# Patient Record
Sex: Female | Born: 1960 | Race: White | Hispanic: No | Marital: Single | Smoking: Former smoker
Health system: Southern US, Community
[De-identification: ages and names within clinical notes are randomized; demographics above are authoritative.]

## PROBLEM LIST (undated history)

## (undated) ENCOUNTER — Ambulatory Visit: Payer: BLUE CROSS/BLUE SHIELD

## (undated) ENCOUNTER — Ambulatory Visit

## (undated) ENCOUNTER — Encounter: Attending: Internal Medicine | Primary: Internal Medicine

## (undated) ENCOUNTER — Telehealth: Attending: Internal Medicine | Primary: Internal Medicine

## (undated) ENCOUNTER — Encounter

## (undated) ENCOUNTER — Telehealth

## (undated) ENCOUNTER — Ambulatory Visit: Payer: MEDICARE

## (undated) ENCOUNTER — Encounter
Payer: PRIVATE HEALTH INSURANCE | Attending: Rehabilitative and Restorative Service Providers" | Primary: Rehabilitative and Restorative Service Providers"

## (undated) ENCOUNTER — Encounter: Payer: PRIVATE HEALTH INSURANCE | Attending: Registered" | Primary: Registered"

## (undated) ENCOUNTER — Ambulatory Visit: Payer: PRIVATE HEALTH INSURANCE

## (undated) ENCOUNTER — Ambulatory Visit: Payer: PRIVATE HEALTH INSURANCE | Attending: Foot and Ankle Surgery | Primary: Foot and Ankle Surgery

## (undated) ENCOUNTER — Encounter
Attending: Pharmacist Clinician (PhC)/ Clinical Pharmacy Specialist | Primary: Pharmacist Clinician (PhC)/ Clinical Pharmacy Specialist

## (undated) ENCOUNTER — Telehealth: Attending: Ambulatory Care | Primary: Ambulatory Care

## (undated) ENCOUNTER — Encounter: Payer: PRIVATE HEALTH INSURANCE | Attending: Foot and Ankle Surgery | Primary: Foot and Ankle Surgery

## (undated) ENCOUNTER — Ambulatory Visit: Attending: Pharmacist | Primary: Pharmacist

## (undated) ENCOUNTER — Encounter
Attending: Student in an Organized Health Care Education/Training Program | Primary: Student in an Organized Health Care Education/Training Program

## (undated) ENCOUNTER — Encounter
Payer: PRIVATE HEALTH INSURANCE | Attending: Student in an Organized Health Care Education/Training Program | Primary: Student in an Organized Health Care Education/Training Program

## (undated) ENCOUNTER — Telehealth: Attending: Nurse Practitioner | Primary: Nurse Practitioner

## (undated) ENCOUNTER — Ambulatory Visit: Payer: Medicare (Managed Care) | Attending: Pharmacist | Primary: Pharmacist

## (undated) ENCOUNTER — Encounter: Payer: PRIVATE HEALTH INSURANCE | Attending: Internal Medicine | Primary: Internal Medicine

## (undated) ENCOUNTER — Ambulatory Visit: Attending: Addiction (Substance Use Disorder) | Primary: Addiction (Substance Use Disorder)

## (undated) ENCOUNTER — Ambulatory Visit
Payer: PRIVATE HEALTH INSURANCE | Attending: Student in an Organized Health Care Education/Training Program | Primary: Student in an Organized Health Care Education/Training Program

## (undated) ENCOUNTER — Telehealth: Attending: Obstetrics & Gynecology | Primary: Obstetrics & Gynecology

## (undated) ENCOUNTER — Encounter: Attending: Pharmacist | Primary: Pharmacist

## (undated) ENCOUNTER — Ambulatory Visit: Attending: Internal Medicine | Primary: Internal Medicine

## (undated) ENCOUNTER — Ambulatory Visit: Attending: Mental Health | Primary: Mental Health

## (undated) ENCOUNTER — Ambulatory Visit: Payer: PRIVATE HEALTH INSURANCE | Attending: Internal Medicine | Primary: Internal Medicine

## (undated) ENCOUNTER — Ambulatory Visit
Payer: PRIVATE HEALTH INSURANCE | Attending: Physical Medicine & Rehabilitation | Primary: Physical Medicine & Rehabilitation

## (undated) ENCOUNTER — Encounter: Attending: Foot and Ankle Surgery | Primary: Foot and Ankle Surgery

## (undated) DIAGNOSIS — J449 Chronic obstructive pulmonary disease, unspecified: Secondary | ICD-10-CM

## (undated) DIAGNOSIS — I10 Essential (primary) hypertension: Secondary | ICD-10-CM

## (undated) HISTORY — PX: ABDOMINAL HYSTERECTOMY: SHX81

## (undated) HISTORY — PX: TYMPANOPLASTY: SHX33

## (undated) HISTORY — PX: CYST EXCISION: SHX5701

---
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
## 2016-04-03 ENCOUNTER — Encounter: Payer: Self-pay | Admitting: Emergency Medicine

## 2016-04-03 ENCOUNTER — Ambulatory Visit
Admission: EM | Admit: 2016-04-03 | Discharge: 2016-04-03 | Disposition: A | Discharge status: Home / Self Care | Payer: Self-pay | Attending: Family Medicine | Admitting: Family Medicine

## 2016-04-03 DIAGNOSIS — M67321 Transient synovitis, right elbow: Secondary | ICD-10-CM

## 2016-04-03 DIAGNOSIS — M67361 Transient synovitis, right knee: Secondary | ICD-10-CM

## 2016-04-03 MED ORDER — PREDNISONE 10 MG (21) PO TBPK
10.0000 mg | ORAL_TABLET | Freq: Every day | ORAL | Status: DC
Start: 2016-04-03 — End: 2016-08-10

## 2016-04-03 NOTE — ED Provider Notes (Signed)
CSN: [id number]     Arrival date & time 04/03/16  1619 History   First MD Initiated Contact with Patient 04/03/16 1722     Chief Complaint  Patient presents with  . Foot Pain  . Foot Swelling   (Consider location/radiation/quality/duration/timing/severity/associated sxs/prior Treatment) HPI  This is a 55 year old female who presents with right foot pain and swelling and increased swelling of her right elbow. She states that she is downsizing from a 3000 ft. home to a thousand square foot apartment and has been moving furniture and packing and unpacking for weeks. She still has more to move. Over the weekend the symptoms became much worse. She knows that this is from all of the activities that she has been doing. She has noticed that if she gets up and moves around the pain  is lessened when she sits down she tends to come very stiff and was extremely painful to begin movement again. She has a previous history of fracture of the right elbow and never has regained full extension. She has a torn meniscus on the left knee and followed by her orthopedist. She denies any numbness or tingling.     History reviewed. No pertinent past medical history. Past Surgical History  Procedure Laterality Date  . Abdominal hysterectomy     History reviewed. No pertinent family history. Social History  Substance Use Topics  . Smoking status: Current Every Day Smoker -- 1.00 packs/day    Types: Cigarettes  . Smokeless tobacco: None  . Alcohol Use: Yes   OB History    No data available     Review of Systems  Constitutional: Positive for activity change. Negative for fever, chills and fatigue.  Musculoskeletal: Positive for myalgias, joint swelling and arthralgias.  All other systems reviewed and are negative.   Allergies  Sulfa antibiotics  Home Medications   Prior to Admission medications   Medication Sig Start Date End Date Taking? Authorizing Provider  ibuprofen (ADVIL,MOTRIN) 800 MG tablet  Take 800 mg by mouth every 8 (eight) hours as needed.   Yes Historical Provider, MD  predniSONE (STERAPRED UNI-PAK 21 TAB) 10 MG (21) TBPK tablet Take 1 tablet (10 mg total) by mouth daily. Take 6 tabs by mouth daily  for 2 days, then 5 tabs for 2 days, then 4 tabs for 2 days, then 3 tabs for 2 days, 2 tabs for 2 days, then 1 tab by mouth daily for 2 days 04/03/16   Lutricia Feil, PA-C   Meds Ordered and Administered this Visit  Medications - No data to display  BP 160/89 mmHg  Pulse 85  Temp(Src) 99.3 F (37.4 C) (Tympanic)  Resp 17  Ht 5\' 6"  (1.676 m)  Wt 229 lb (103.874 kg)  BMI 36.98 kg/m2  SpO2 96% No data found.   Physical Exam  Constitutional: She is oriented to person, place, and time. She appears well-developed and well-nourished. No distress.  HENT:  Head: Normocephalic and atraumatic.  Eyes: Conjunctivae are normal. Pupils are equal, round, and reactive to light.  Neck: Normal range of motion. Neck supple.  Musculoskeletal: She exhibits edema and tenderness.  Examination of the right elbow shows full flexion but lacks 10 of full extension. This is from a previous fracture and is a residual of that injury. There is tenderness about the elbow joint which is diffuse. There was no additional injury to this joint. Wrist range of motion finger range of motion are normal. Pronation and supination are full. Shoulder range  of motion is normal. Examination of the right ankle shows a bogginess and tenderness about the tear lateral posterior and medial ankle. There is a palpable swelling. Range of motion is slightly decreased in comparison to the left. Neurovascular is intact distally. She does have some pitting edema of both lower extremities graded 1+. This is probably from now for prolonged periods and is dependent edema.  Neurological: She is alert and oriented to person, place, and time. She exhibits normal muscle tone. Coordination normal.  Skin: Skin is warm and dry. No rash  noted. She is not diaphoretic. No erythema. No pallor.  Psychiatric: She has a normal mood and affect. Her behavior is normal. Judgment and thought content normal.  Nursing note and vitals reviewed.   ED Course  Procedures (including critical care time)  Labs Review Labs Reviewed - No data to display  Imaging Review No results found.   Visual Acuity Review  Right Eye Distance:   Left Eye Distance:   Bilateral Distance:    Right Eye Near:   Left Eye Near:    Bilateral Near:         MDM   1. Transient synovitis of right elbow   2. Transient synovitis of right knee    Discharge Medication List as of 04/03/2016  6:03 PM    START taking these medications   Details  predniSONE (STERAPRED UNI-PAK 21 TAB) 10 MG (21) TBPK tablet Take 1 tablet (10 mg total) by mouth daily. Take 6 tabs by mouth daily  for 2 days, then 5 tabs for 2 days, then 4 tabs for 2 days, then 3 tabs for 2 days, 2 tabs for 2 days, then 1 tab by mouth daily for 2 days, Starting 04/03/2016, Until Discontinued, P rint      Plan: 1. Test/x-ray results and diagnosis reviewed with patient 2. rx as per orders; risks, benefits, potential side effects reviewed with patient 3. Recommend supportive treatment with Rest and symptom avoidance. Or she does not slow down this will only worsen. She has tried nostril anti-inflammatory medications which have not been beneficial. In the past she has used steroids and is requesting those. Her preferred to place her on a Medrol Dosepak of 6 day but that she is insisting on 12 days because of the amount of time she has before her house" warned her of the side effects of prednisone and the limbs associated with long-term use. Despite this she wishes to mask her symptoms so that she can complete the move before her closing date. She'll follow-up with her primary care if she is not improving. 4. F/u prn if symptoms worsen or don't improve      Lutricia Feil, PA-C 04/03/16 1822

## 2016-04-03 NOTE — ED Notes (Signed)
Patient c/o right foot pain and swelling that started over the weekend.

## 2016-08-10 ENCOUNTER — Encounter
Admission: RE | Admit: 2016-08-10 | Discharge: 2016-08-10 | Disposition: A | Discharge status: Home / Self Care | Payer: Self-pay | Source: Ambulatory Visit | Attending: Orthopedic Surgery | Admitting: Orthopedic Surgery

## 2016-08-10 NOTE — Patient Instructions (Addendum)
  Your procedure is scheduled [id number] 31, 2017. Report to Same Day Surgery. To find out your arrival time please call [phone] between 1PM - 3PM on Wednesday August 15, 2016.  Remember: Instructions that are not followed completely may result in serious medical risk, up to and including death, or upon the discretion of your surgeon and anesthesiologist your surgery may need to be rescheduled.    _x___ 1. Do not eat food or drink liquids after midnight. No gum chewing or hard candies.     _x___ 2. No Alcohol for 24 hours before or after surgery.   ____ 3. Bring all medications with you on the day of surgery if instructed.    __x__ 4. Notify your doctor if there is any change in your medical condition     (cold, fever, infections)  __x__ 5. No smoking for 24 hours prior to surgery.     Do not wear jewelry, make-up, hairpins, clips or nail polish.  Do not wear lotions, powders, or perfumes. You may wear deodorant.  Do not shave 48 hours prior to surgery. Men may shave face and neck.  Do not bring valuables to the hospital.    [facility] is not responsible for any belongings or valuables.               Contacts, dentures or bridgework may not be worn into surgery.  Leave your suitcase in the car. After surgery it may be brought to your room.  For patients admitted to the hospital, discharge time is determined by your treatment team.   Patients discharged the day of surgery will not be allowed to drive home.    Please read over the following fact sheets that you were given:   [facility] Preparing for Surgery  ____ Take these medicines the morning of surgery with A SIP OF WATER: none    ____ Fleet Enema (as directed)   ____ Use CHG Soap as directed on instruction sheet  ____ Use inhalers on the day of surgery and bring to hospital day of surgery  ____ Stop metformin 2 days prior to surgery    ____ Take 1/2 of usual insulin dose the night before surgery and none on the  morning of  surgery.   ____ Stop Coumadin/Plavix/aspirin on does not apply.  _x___ Stop Anti-inflammatories such as Diclofenac, Advil, Aleve, Ibuprofen, Motrin, Naproxen, Naprosyn, Goodies powders or aspirin products. OK to take Tylenol and  or Hydrocodone-acetaminophen.   _x___ Stop supplements: all vitamins until after surgery.    ____ Bring C-Pap to the hospital.

## 2016-08-16 ENCOUNTER — Ambulatory Visit: Payer: BLUE CROSS/BLUE SHIELD | Admitting: Anesthesiology

## 2016-08-16 ENCOUNTER — Encounter
Admission: RE | Disposition: A | Discharge status: Home / Self Care | Payer: Self-pay | Source: Ambulatory Visit | Attending: Orthopedic Surgery

## 2016-08-16 ENCOUNTER — Ambulatory Visit
Admission: RE | Admit: 2016-08-16 | Discharge: 2016-08-16 | Disposition: A | Discharge status: Home / Self Care | Payer: BLUE CROSS/BLUE SHIELD | Source: Ambulatory Visit | Attending: Orthopedic Surgery | Admitting: Orthopedic Surgery

## 2016-08-16 ENCOUNTER — Encounter: Payer: Self-pay | Admitting: *Deleted

## 2016-08-16 DIAGNOSIS — M23312 Other meniscus derangements, anterior horn of medial meniscus, left knee: Secondary | ICD-10-CM | POA: Diagnosis not present

## 2016-08-16 DIAGNOSIS — M2342 Loose body in knee, left knee: Secondary | ICD-10-CM | POA: Diagnosis not present

## 2016-08-16 DIAGNOSIS — S83282A Other tear of lateral meniscus, current injury, left knee, initial encounter: Secondary | ICD-10-CM | POA: Diagnosis present

## 2016-08-16 DIAGNOSIS — F17200 Nicotine dependence, unspecified, uncomplicated: Secondary | ICD-10-CM | POA: Diagnosis not present

## 2016-08-16 DIAGNOSIS — M65862 Other synovitis and tenosynovitis, left lower leg: Secondary | ICD-10-CM | POA: Insufficient documentation

## 2016-08-16 DIAGNOSIS — M23342 Other meniscus derangements, anterior horn of lateral meniscus, left knee: Secondary | ICD-10-CM | POA: Diagnosis not present

## 2016-08-16 HISTORY — PX: KNEE ARTHROSCOPY WITH MEDIAL MENISECTOMY: SHX5651

## 2016-08-16 SURGERY — ARTHROSCOPY, KNEE, WITH MEDIAL MENISCECTOMY
Anesthesia: General | Site: Knee | Laterality: Left | Wound class: Clean

## 2016-08-16 MED ORDER — PHENYLEPHRINE HCL 10 MG/ML IJ SOLN
INTRAMUSCULAR | Status: DC | PRN
  Administered 2016-08-16 (×2): 100 ug via INTRAVENOUS

## 2016-08-16 MED ORDER — HYDROCODONE-ACETAMINOPHEN 5-325 MG PO TABS
ORAL_TABLET | ORAL | Status: AC
  Filled 2016-08-16: qty 1

## 2016-08-16 MED ORDER — FAMOTIDINE 20 MG PO TABS
20.0000 mg | ORAL_TABLET | Freq: Once | ORAL | Status: AC
  Administered 2016-08-16: 20 mg via ORAL

## 2016-08-16 MED ORDER — DEXMEDETOMIDINE HCL 200 MCG/2ML IV SOLN
INTRAVENOUS | Status: DC | PRN
  Administered 2016-08-16 (×2): 8 ug via INTRAVENOUS

## 2016-08-16 MED ORDER — PROPOFOL 10 MG/ML IV BOLUS
INTRAVENOUS | Status: DC | PRN
  Administered 2016-08-16: 200 mg via INTRAVENOUS

## 2016-08-16 MED ORDER — ONDANSETRON HCL 4 MG/2ML IJ SOLN
INTRAMUSCULAR | Status: DC | PRN
  Administered 2016-08-16: 4 mg via INTRAVENOUS

## 2016-08-16 MED ORDER — BUPIVACAINE-EPINEPHRINE (PF) 0.5% -1:200000 IJ SOLN
INTRAMUSCULAR | Status: AC
  Filled 2016-08-16: qty 30

## 2016-08-16 MED ORDER — MIDAZOLAM HCL 5 MG/5ML IJ SOLN
INTRAMUSCULAR | Status: DC | PRN
  Administered 2016-08-16: 2 mg via INTRAVENOUS

## 2016-08-16 MED ORDER — ONDANSETRON HCL 4 MG/2ML IJ SOLN: 4.0000 mg | Freq: Once | INTRAMUSCULAR | Status: DC | PRN

## 2016-08-16 MED ORDER — BUPIVACAINE-EPINEPHRINE (PF) 0.5% -1:200000 IJ SOLN
INTRAMUSCULAR | Status: DC | PRN
  Administered 2016-08-16: 20 mL via PERINEURAL

## 2016-08-16 MED ORDER — FENTANYL CITRATE (PF) 100 MCG/2ML IJ SOLN
INTRAMUSCULAR | Status: DC | PRN
  Administered 2016-08-16 (×4): 50 ug via INTRAVENOUS

## 2016-08-16 MED ORDER — CLINDAMYCIN PHOSPHATE 900 MG/50ML IV SOLN
INTRAVENOUS | Status: AC
  Filled 2016-08-16: qty 50

## 2016-08-16 MED ORDER — FENTANYL CITRATE (PF) 100 MCG/2ML IJ SOLN
25.0000 ug | INTRAMUSCULAR | Status: AC | PRN
  Administered 2016-08-16 (×6): 25 ug via INTRAVENOUS

## 2016-08-16 MED ORDER — FENTANYL CITRATE (PF) 100 MCG/2ML IJ SOLN
INTRAMUSCULAR | Status: AC
  Filled 2016-08-16: qty 2

## 2016-08-16 MED ORDER — CLINDAMYCIN PHOSPHATE 900 MG/50ML IV SOLN: 900.0000 mg | Freq: Once | INTRAVENOUS | Status: DC

## 2016-08-16 MED ORDER — LACTATED RINGERS IV SOLN
INTRAVENOUS | Status: DC
  Administered 2016-08-16 (×3): via INTRAVENOUS

## 2016-08-16 MED ORDER — CEFAZOLIN IN D5W 1 GM/50ML IV SOLN
INTRAVENOUS | Status: DC | PRN
  Administered 2016-08-16: 2 g via INTRAVENOUS

## 2016-08-16 MED ORDER — HYDROCODONE-ACETAMINOPHEN 5-325 MG PO TABS
1.0000 | ORAL_TABLET | Freq: Four times a day (QID) | ORAL | Status: DC | PRN
  Administered 2016-08-16: 1 via ORAL

## 2016-08-16 MED ORDER — FAMOTIDINE 20 MG PO TABS
ORAL_TABLET | ORAL | Status: AC
  Administered 2016-08-16: 20 mg via ORAL
  Filled 2016-08-16: qty 1

## 2016-08-16 MED ORDER — LIDOCAINE 2% (20 MG/ML) 5 ML SYRINGE
INTRAMUSCULAR | Status: DC | PRN
  Administered 2016-08-16: 100 mg via INTRAVENOUS

## 2016-08-16 MED ORDER — HYDROCODONE-ACETAMINOPHEN 5-325 MG PO TABS: 1.0000 | ORAL_TABLET | Freq: Four times a day (QID) | ORAL | 0 refills | Status: DC | PRN

## 2016-08-16 MED ORDER — GLYCOPYRROLATE 0.2 MG/ML IJ SOLN
INTRAMUSCULAR | Status: DC | PRN
  Administered 2016-08-16: 0.2 mg via INTRAVENOUS

## 2016-08-16 SURGICAL SUPPLY — 29 items
BANDAGE ACE 4X5 VEL STRL LF (GAUZE/BANDAGES/DRESSINGS) ×3 IMPLANT
BANDAGE ELASTIC 4 LF NS (GAUZE/BANDAGES/DRESSINGS) IMPLANT
BLADE FULL RADIUS 3.5 (BLADE) IMPLANT
BLADE INCISOR PLUS 4.5 (BLADE) ×3 IMPLANT
BLADE SHAVER 4.5 DBL SERAT CV (CUTTER) IMPLANT
BLADE SHAVER 4.5X7 STR FR (MISCELLANEOUS) IMPLANT
CHLORAPREP W/TINT 26ML (MISCELLANEOUS) ×3 IMPLANT
CUFF TOURN 24 STER (MISCELLANEOUS) IMPLANT
CUFF TOURN 30 STER DUAL PORT (MISCELLANEOUS) ×3 IMPLANT
DRAPE C-ARM XRAY 36X54 (DRAPES) IMPLANT
GAUZE SPONGE 4X4 12PLY STRL (GAUZE/BANDAGES/DRESSINGS) ×3 IMPLANT
GLOVE BIO SURGEON STRL SZ7 (GLOVE) ×3 IMPLANT
GLOVE SURG ORTHO 9.0 STRL STRW (GLOVE) ×3 IMPLANT
GOWN SRG 2XL LVL 4 RGLN SLV (GOWNS) ×1 IMPLANT
GOWN STRL NON-REIN 2XL LVL4 (GOWNS) ×2
GOWN STRL REUS W/ TWL LRG LVL3 (GOWN DISPOSABLE) ×2 IMPLANT
GOWN STRL REUS W/TWL LRG LVL3 (GOWN DISPOSABLE) ×4
IV LACTATED RINGER IRRG 3000ML (IV SOLUTION) ×8
IV LR IRRIG 3000ML ARTHROMATIC (IV SOLUTION) ×4 IMPLANT
KIT RM TURNOVER STRD PROC AR (KITS) ×3 IMPLANT
MANIFOLD NEPTUNE II (INSTRUMENTS) ×3 IMPLANT
PACK ARTHROSCOPY KNEE (MISCELLANEOUS) ×3 IMPLANT
SET TUBE SUCT SHAVER OUTFL 24K (TUBING) ×3 IMPLANT
SET TUBE TIP INTRA-ARTICULAR (MISCELLANEOUS) ×3 IMPLANT
SUT ETHILON 4-0 (SUTURE) ×2
SUT ETHILON 4-0 FS2 18XMFL BLK (SUTURE) ×1
SUTURE ETHLN 4-0 FS2 18XMF BLK (SUTURE) ×1 IMPLANT
TUBING ARTHRO INFLOW-ONLY STRL (TUBING) ×3 IMPLANT
WAND HAND CNTRL MULTIVAC 50 (MISCELLANEOUS) ×3 IMPLANT

## 2016-08-16 NOTE — Anesthesia Procedure Notes (Signed)
Procedure Name: LMA Insertion Date/Time: 08/16/2016 1:35 PM Performed by: Paulette Blanch Pre-anesthesia Checklist: Patient identified, Patient being monitored, Timeout performed, Emergency Drugs available and Suction available Patient Re-evaluated:Patient Re-evaluated prior to inductionOxygen Delivery Method: Circle system utilized Preoxygenation: Pre-oxygenation with 100% oxygen Intubation Type: IV induction Ventilation: Mask ventilation without difficulty LMA: LMA inserted LMA Size: 3.5 Tube type: Oral Number of attempts: 1 Placement Confirmation: positive ETCO2 and breath sounds checked- equal and bilateral Tube secured with: Tape Dental Injury: Teeth and Oropharynx as per pre-operative assessment

## 2016-08-16 NOTE — Anesthesia Preprocedure Evaluation (Signed)
Anesthesia Evaluation  Patient identified by MRN, date of birth, ID band Patient awake    Reviewed: Allergy & Precautions, NPO status , Patient's Chart, lab work & pertinent test results  History of Anesthesia Complications Negative for: history of anesthetic complications  Airway Mallampati: II       Dental   Pulmonary neg pulmonary ROS, Current Smoker,           Cardiovascular negative cardio ROS       Neuro/Psych    GI/Hepatic negative GI ROS, Neg liver ROS,   Endo/Other  negative endocrine ROS  Renal/GU negative Renal ROS     Musculoskeletal   Abdominal   Peds  Hematology negative hematology ROS (+)   Anesthesia Other Findings   Reproductive/Obstetrics                             Anesthesia Physical Anesthesia Plan  ASA: II  Anesthesia Plan: General   Post-op Pain Management:    Induction: Intravenous  Airway Management Planned:   Additional Equipment:   Intra-op Plan:   Post-operative Plan:   Informed Consent: I have reviewed the patients History and Physical, chart, labs and discussed the procedure including the risks, benefits and alternatives for the proposed anesthesia with the patient or authorized representative who has indicated his/her understanding and acceptance.     Plan Discussed with:   Anesthesia Plan Comments:         Anesthesia Quick Evaluation

## 2016-08-16 NOTE — Discharge Instructions (Addendum)
AMBULATORY SURGERY  DISCHARGE INSTRUCTIONS   1) The drugs that you were given will stay in your system until tomorrow so for the next 24 hours you should not:  A) Drive an automobile B) Make any legal decisions C) Drink any alcoholic beverage   2) You may resume regular meals tomorrow.  Today it is better to start with liquids and gradually work up to solid foods.  You may eat anything you prefer, but it is better to start with liquids, then soup and crackers, and gradually work up to solid foods.   3) Please notify your doctor immediately if you have any unusual bleeding, trouble breathing, redness and pain at the surgery site, drainage, fever, or pain not relieved by medication. 4)   5) Your post-operative visit with Dr.                                     is: Date:                        Time:    Please call to schedule your post-operative visit.  6) Additional Instructions:     Keep bandage clean and dry. Weightbearing as tolerated on left leg. If bandage slides down the leg remove entire bandage and covered to incisions with Band-Aids, reapply Ace wrap. Remove entire bandage on Sunday and cover both incisions with Band-Aids and then put Ace wrap on so it feels comfortable. Okay to shower but not soak intolerable after changing bandage on Sunday, change to dry Band-Aids after shower. Aspirin 325 mg daily. Pain medicine as directed

## 2016-08-16 NOTE — Op Note (Signed)
08/16/2016  2:43 PM  PATIENT:  Kelly Trujillo  55 y.o. female  PRE-OPERATIVE DIAGNOSIS:  SYNOVITIS OF KNEE, medial and lateral meniscus tears, multiple loose bodies  POST-OPERATIVE DIAGNOSIS:  SYNOVITIS OF KNEE, medial lateral meniscus tears, multiple loose bodies  PROCEDURE:  Procedure(s): KNEE ARTHROSCOPY WITH SYNOVECTOMY, MEDIAL AND LATERAL MENISCUS TEAR, LOOSE BODY (Left)  SURGEON: Leitha Schuller, MD  ASSISTANTS: None  ANESTHESIA:   general  EBL:  Total I/O In: 800 [I.V.:800] Out: 5 [Blood:5]  BLOOD ADMINISTERED:none  DRAINS: none   LOCAL MEDICATIONS USED:  MARCAINE     SPECIMEN:  No Specimen  DISPOSITION OF SPECIMEN:  N/A  COUNTS:  YES  TOURNIQUET:   18 minutes at 300 mmHg  IMPLANTS: None  DICTATION: .Dragon Dictation patient was brought to the operating room and after adequate general anesthesia was obtained, the left leg was placed in arthroscopic leg holder with tourniquet applied. After prepping and draping at an appropriate patient identification and timeout procedures completed, an inferior lateral portal was made and the arthroscope was introduced. Inspection revealed a large number of floating loose bodies within the knee. An inferior medial portal was made and a shaver was placed in the suprapatellar pouch to help debride these remove these to allow for inspection of the knee without this blocking the view. Multiple small loose bodies and several larger loose bodies were removed at this point. The patellofemoral joint showed normal alignment with mild chondromalacia of the patella. The gutters were checked and there were multiple loose bodies and these as well and these were debrided as well with the shaver. Coming to the medial compartment there is a marked synovitis at along the entire anterior portion of the knee blocking visualization this was debrided with use of the shaver. Even the notch had significant synovitis but after debriding this the anterior  cruciate ligament was noted to be intact. In the medial compartment there was a tear of the anterior and middle horn thirds that were debrided was some fraying posteriorly. The shaver was used followed by the ArthroCare wand to get a stable margin. There was some full-thickness articular cartilage loss on the tibia as well as femur on the medial side. Going to the lateral compartment again partial synovectomy required in the anterior compartment to provide adequate visualization and on inspection of the lateral compartment again some areas of full-thickness articular cartilage loss with tear of the middle third of the lateral meniscus extending to the anterior third. A ArthroCare wand and shaver is used to debride this back to a stable margin. Pictures pre-and post procedure were taken after thoroughly irrigating the knee and removing multiple loose bodies with good visualization of the anterior compartment and adequate resection of synovitis the knee was irrigated until clear and all instrumentation withdrawn. 4-0 nylon skin sutures were used to close the 2 incisions and 20 cc of half percent Sensorcaine infiltrated to aid in postop analgesia. Tourniquet was raised while these synovitis was shaved because of bleeding and this was let down at the close of the case. Wounds were dressed with Xeroform 4 x 4's web roll and an Ace wrap  PLAN OF CARE: Discharge to home after PACU  PATIENT DISPOSITION:  PACU - hemodynamically stable.

## 2016-08-16 NOTE — H&P (Signed)
Reviewed paper H+P, will be scanned into chart. No changes noted.  

## 2016-08-16 NOTE — Transfer of Care (Signed)
Immediate Anesthesia Transfer of Care Note  Patient: Kelly Trujillo  Procedure(s) Performed: Procedure(s): KNEE ARTHROSCOPY WITH SYNOVECTOMY, MEDIAL AND LATERAL MENISCUS TEAR, LOOSE BODY (Left)  Patient Location: PACU  Anesthesia Type:General  Level of Consciousness: awake, alert  and oriented  Airway & Oxygen Therapy: Patient Spontanous Breathing and Patient connected to face mask oxygen  Post-op Assessment: Report given to RN and Post -op Vital signs reviewed and stable  Post vital signs: Reviewed and stable  Last Vitals:  Vitals:   08/16/16 1243 08/16/16 1244  BP:  133/78  Pulse:  92  Resp: 16   Temp:  36.5 C    Last Pain:  Vitals:   08/16/16 1243  TempSrc: Oral  PainSc: 5          Complications: No apparent anesthesia complications

## 2016-08-17 ENCOUNTER — Ambulatory Visit: Payer: BLUE CROSS/BLUE SHIELD | Admitting: Occupational Therapy

## 2016-08-17 DIAGNOSIS — M79645 Pain in left finger(s): Secondary | ICD-10-CM

## 2016-08-17 DIAGNOSIS — M23342 Other meniscus derangements, anterior horn of lateral meniscus, left knee: Secondary | ICD-10-CM | POA: Diagnosis not present

## 2016-08-17 DIAGNOSIS — R600 Localized edema: Secondary | ICD-10-CM

## 2016-08-17 DIAGNOSIS — M25642 Stiffness of left hand, not elsewhere classified: Secondary | ICD-10-CM

## 2016-08-17 NOTE — Patient Instructions (Signed)
Pt to cont contrast Compression sleeve on 3rd - night time - off every 2 hres during day  Splint on at night time - off 3-5 x during day - to do HEP   DIP flexion block Tendon glides AROM - block proximal phalanges - to extend PIP fully active  If PIP joint show normal extention   can take splint off during day - only at night

## 2016-08-17 NOTE — Therapy (Signed)
[facility]TS MEDICINE [address], [place], [address] Phone: [phone]   Fax:  [phone]  Occupational Therapy Treatment  Patient Details  Name: Kelly Trujillo MRN: [id number] Date of Birth: Dec 01, 1961 Referring Provider: Rosita Kea  Encounter Date: 08/17/2016      OT End of Session - 08/17/16 1034    Visit Number 1   Number of Visits 3   Date for OT Re-Evaluation 09/07/16   OT Start Time 0815   OT Stop Time 0900   OT Time Calculation (min) 45 min   Activity Tolerance Patient tolerated treatment well   Behavior During Therapy [facility] for tasks assessed/performed      No past medical history on file.  Past Surgical History:  Procedure Laterality Date  . ABDOMINAL HYSTERECTOMY    . CYST EXCISION  1980s  . KNEE ARTHROSCOPY WITH MEDIAL MENISECTOMY Left 08/16/2016   Procedure: KNEE ARTHROSCOPY WITH SYNOVECTOMY, MEDIAL AND LATERAL MENISCUS TEAR, LOOSE BODY;  Surgeon: Kennedy Bucker, MD;  Location: [facility] ORS;  Service: Orthopedics;  Laterality: Left;  . TYMPANOPLASTY      There were no vitals filed for this visit.      Subjective Assessment - 08/17/16 0909    Subjective  I had my knee surgery yesterday and had to go off my aspirin and antiimflammatory - pushing up one this hand - had my hand swell up the last 10 days to 2 wks - and the middle finger the worse -looks already better today  - took aspirin last night   Patient Stated Goals To be able to make full fist - and get the swelling  down    Currently in Pain? 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    Referring Provider Rosita Kea   Onset Date 08/03/16     Home  Environment   Lives With Significant other     Prior Function   Vocation Full time employment   Leisure R hand  dominant - work Adult nurse at [employer] -      Edema   Edema PIP on L increase 7.8 , R 7 cm      Right Hand AROM   R Long DIP 0-70 75 Degrees     Left Hand AROM   L Index  MCP 0-90 70 Degrees   L Index PIP 0-100 90 Degrees   L Long  MCP 0-90 70 Degrees   L Long PIP 0-100 80 Degrees  -20   L Long DIP 0-70 40 Degrees   L Ring  MCP 0-90 70 Degrees   L Ring PIP 0-100 90 Degrees   L Little  MCP 0-90 70 Degrees   L Little PIP 0-100 90 Degrees      Pt to do contrast Compression sleeve on 3rd - night time - off every 2 hres during day  Fabricated clam shell  PIP extention Splint on 3rd PIP  at night time  On - off 3-5 x during day - to do HEP   DIP flexion block Tendon glides AROM - block proximal phalanges - to extend PIP fully active  If PIP joint show normal extention   can take splint off during  day - only at night                        OT Education - 08/17/16 1033    Education provided Yes   Education Details HEP   Person(s) Educated Patient;Other (comment)   Methods Explanation;Demonstration;Tactile cues;Verbal cues;Handout   Comprehension Verbal cues required;Returned demonstration;Verbalized understanding             OT Long Term Goals - 08/17/16 1540      OT LONG TERM GOAL #1   Title Pt to be ind in HEP for splinting , compression , and ROM - to increase AROM in PIP and DIP to WNL    Baseline .8 increase edema, PIP ext/ flexion impaired , DIP flexion impaired   Time 2   Period Weeks   Status New               Plan - 08/17/16 1529    Clinical Impression Statement Pt refer to hand therapy  for splint for Boutonniere deformity on L 3rd PIP - pt show increase edema , pain - decrease PIP extention and DIP flexion  - pt fitted with compression sleeve , PIP exention splint - and HEP for ROM provided - and to do contrast to decrease edema - pt to check back in week or phone if  doing okay    Rehab Potential Good   OT Frequency 1x /  week   OT Duration 2 weeks   OT Treatment/Interventions Self-care/ADL training;Splinting;Patient/family education;Manual Therapy;Therapeutic exercise   Plan check back in week or phone if doing okay       Patient will benefit from skilled therapeutic intervention in order to improve the following deficits and impairments:  Impaired flexibility, Increased edema, Pain, Decreased range of motion  Visit Diagnosis: Stiffness of left hand, not elsewhere classified - Plan: Ot plan of care cert/re-cert  Localized edema - Plan: Ot plan of care cert/re-cert  Pain in left finger(s) - Plan: Ot plan of care cert/re-cert    Problem List There are no active problems to display for this patient.   Oletta Cohn OTR/L,CLT  08/17/2016, 4:00 PM  [facility] PH[facility] SPORTS MEDICINE [address] [place], [address] Phone: [phone]   Fax:  [phone]  Name: Kelly Trujillo MRN: [id number] Date of Birth: [birth year]

## 2016-08-21 NOTE — Anesthesia Postprocedure Evaluation (Signed)
Anesthesia Post Note  Patient: Kelly Trujillo  Procedure(s) Performed: Procedure(s) (LRB): KNEE ARTHROSCOPY WITH SYNOVECTOMY, MEDIAL AND LATERAL MENISCUS TEAR, LOOSE BODY (Left)  Patient location during evaluation: PACU Anesthesia Type: General Level of consciousness: awake and alert Pain management: pain level controlled Vital Signs Assessment: post-procedure vital signs reviewed and stable Respiratory status: spontaneous breathing and respiratory function stable Cardiovascular status: stable Anesthetic complications: no    Last Vitals:  Vitals:   08/16/16 1550 08/16/16 1622  BP: (!) 142/87 131/78  Pulse: 80 88  Resp:  16  Temp: 36.1 C     Last Pain:  Vitals:   08/16/16 1622  TempSrc:   PainSc: 3                  [name] K

## 2016-08-24 ENCOUNTER — Ambulatory Visit: Payer: BLUE CROSS/BLUE SHIELD | Attending: Orthopedic Surgery | Admitting: Occupational Therapy

## 2016-08-24 DIAGNOSIS — M79645 Pain in left finger(s): Secondary | ICD-10-CM | POA: Insufficient documentation

## 2016-08-24 DIAGNOSIS — R600 Localized edema: Secondary | ICD-10-CM | POA: Insufficient documentation

## 2016-08-24 DIAGNOSIS — M25642 Stiffness of left hand, not elsewhere classified: Secondary | ICD-10-CM | POA: Diagnosis not present

## 2016-08-24 NOTE — Patient Instructions (Signed)
  Pt to wear splint at night time for PIP - and gradually decrease to every 2 hrs on and off ,then 4 hrs off and 2 hrs on - if extention lag do not worsen  Last discharging night splint   Fitted with Benik neoprene splint for wrist to decrease pain and provided support  Pt to cont with same HEP for ROM  Including contrast

## 2016-08-24 NOTE — Therapy (Signed)
[facility]ICINE [address], [place], [address] Phone: [phone]   Fax:  [phone]  Occupational Therapy Treatment  Patient Details  Name: Kelly Trujillo MRN: [id number] Date of Birth: [birth year] Referring Provider: Rosita Kea  Encounter Date: 08/24/2016      OT End of Session - 08/24/16 1242    Visit Number 2   Date for OT Re-Evaluation 09/07/16   OT Start Time 1150   OT Stop Time 1215   OT Time Calculation (min) 25 min   Activity Tolerance Patient tolerated treatment well   Behavior During Therapy [facility] for tasks assessed/performed      No past medical history on file.  Past Surgical History:  Procedure Laterality Date  . ABDOMINAL HYSTERECTOMY    . CYST EXCISION  1980s  . KNEE ARTHROSCOPY WITH MEDIAL MENISECTOMY Left 08/16/2016   Procedure: KNEE ARTHROSCOPY WITH SYNOVECTOMY, MEDIAL AND LATERAL MENISCUS TEAR, LOOSE BODY;  Surgeon: Kennedy Bucker, MD;  Location: [facility] ORS;  Service: Orthopedics;  Laterality: Left;  . TYMPANOPLASTY      There were no vitals filed for this visit.      Subjective Assessment - 08/24/16 1240    Subjective  My wrist is acting up - pain worse in my wrist - finger doing okay - I need some new strap on splint - wore it most of time - and did the bending of the tip - pain about 3/10 - do you have something for my wrist - like soft splint - I am now taking some anti imflammatory    Patient Stated Goals To be able to make full fist - and get the swelling  down    Currently in Pain? Yes   Pain Score 3    Pain Location Finger (Comment which one)   Pain Orientation Left   Pain Descriptors / Indicators Aching            [facility] OT Assessment - 08/24/16 0001      Right Hand AROM   R Long DIP 0-70 75 Degrees     Left Hand AROM   L Index  MCP 0-90 75 Degrees   L Index PIP 0-100 95 Degrees   L Long  MCP 0-90 75 Degrees   L Long PIP 0-100 90 Degrees  -5   L Long DIP 0-70 65  Degrees   L Ring  MCP 0-90 80 Degrees   L Ring PIP 0-100 100 Degrees   L Little  MCP 0-90 80 Degrees   L Little PIP 0-100 95 Degrees      Assess AROM for L 3rd digit PIP flexion and extention , DIP flexion  Edema at 3rd PIP - decrease by .5 cm  See flow sheet for ROM   Modified PIP extention splint to allow for decrease edema and increase last 5 degrees of extention lag Pt to wear splint at night time for PIP - and gradually decrease to every 2 hrs on and off ,then 4 hrs off and 2 hrs on - if extention lag do not worsen  Last discharging night splint  DIP flexion of 3rd increase - blocked DIP flexion done  Fitted with Benik neoprene splint for wrist to decrease pain and provided support  Pt to cont with same HEP for ROM  Including contrast                      OT Education - 08/24/16 1242  Education provided Yes   Education Details HEP changes   Person(s) Educated Patient;Other (comment)   Methods Explanation;Demonstration;Verbal cues;Tactile cues   Comprehension Verbalized understanding;Returned demonstration;Verbal cues required             OT Long Term Goals - 08/24/16 1245      OT LONG TERM GOAL #1   Title Pt to be ind in HEP for splinting , compression , and ROM - to increase AROM in PIP and DIP to WNL    Baseline edema decrease by .5 cm , DIP flexion improve to 65 from 40 and PIP extention from -20 to -5    Status Achieved               Plan - 08/24/16 1243    Clinical Impression Statement Pt made progress in pain , extention and flexion at 3rd PIP , DIP flexion , edema- pt splint modify to allow for decrease edema - pt to wear at night and can wean gradually during daytime out of  PIP extention splint -  pt to cont with HEP - and wear wrist Benik neoprene splint for support and pain - ed on using - pt to phone if need  follow up  - to cont with HEP    Rehab Potential Good   OT Frequency 1x / week   OT Duration 2 weeks   OT  Treatment/Interventions Self-care/ADL training;Splinting;Patient/family education;Manual Therapy;Therapeutic exercise   Plan pt to contact this OT if need follow up - can cont with HEP    OT Home Exercise Plan see pt instruction    Consulted and Agree with Plan of Care Patient      Patient will benefit from skilled therapeutic intervention in order to improve the following deficits and impairments:  Impaired flexibility, Increased edema, Pain, Decreased range of motion  Visit Diagnosis: Stiffness of left hand, not elsewhere classified  Localized edema  Pain in left finger(s)    Problem List There are no active problems to display for this patient.   Oletta Cohn OTR/L,CLT  08/24/2016, 12:49 PM  [facility] PHYSICAL AND SPORTS MEDICINE [address] [place], [address] Phone: [phone]   Fax:  [phone]  Name: Lynix Bonine MRN: [id number] Date of Birth: 1961-11-01

## 2016-09-24 ENCOUNTER — Other Ambulatory Visit: Payer: Self-pay | Admitting: Orthopedic Surgery

## 2016-09-24 DIAGNOSIS — M25532 Pain in left wrist: Principal | ICD-10-CM

## 2016-09-24 DIAGNOSIS — G8929 Other chronic pain: Secondary | ICD-10-CM

## 2016-09-25 ENCOUNTER — Other Ambulatory Visit: Payer: Self-pay | Admitting: Orthopedic Surgery

## 2016-09-25 DIAGNOSIS — M25532 Pain in left wrist: Principal | ICD-10-CM

## 2016-09-25 DIAGNOSIS — G8929 Other chronic pain: Secondary | ICD-10-CM

## 2016-10-04 ENCOUNTER — Ambulatory Visit: Payer: BLUE CROSS/BLUE SHIELD

## 2016-10-05 ENCOUNTER — Ambulatory Visit: Admission: RE | Admit: 2016-10-05 | Payer: BLUE CROSS/BLUE SHIELD | Source: Ambulatory Visit

## 2016-10-10 ENCOUNTER — Ambulatory Visit
Admission: RE | Admit: 2016-10-10 | Discharge: 2016-10-10 | Disposition: A | Discharge status: Home / Self Care | Payer: BLUE CROSS/BLUE SHIELD | Source: Ambulatory Visit | Attending: Orthopedic Surgery | Admitting: Orthopedic Surgery

## 2016-10-10 DIAGNOSIS — M25532 Pain in left wrist: Secondary | ICD-10-CM | POA: Diagnosis not present

## 2016-10-10 DIAGNOSIS — G8929 Other chronic pain: Secondary | ICD-10-CM | POA: Diagnosis present

## 2016-10-10 DIAGNOSIS — L989 Disorder of the skin and subcutaneous tissue, unspecified: Secondary | ICD-10-CM | POA: Diagnosis present

## 2016-10-10 MED ORDER — GADOBENATE DIMEGLUMINE 529 MG/ML IV SOLN
20.0000 mL | Freq: Once | INTRAVENOUS | Status: AC | PRN
  Administered 2016-10-10: 20 mL via INTRAVENOUS

## 2017-01-24 IMAGING — MR MR WRIST*L* WO/W CM
7 series · 40 of 40 positions shown · IV contrast (multihance)
Comparison: None.

CLINICAL DATA: Pain in the left wrist. Unable to grip. Swelling of
the middle finger. Limited range of motion.

EXAM:
MR OF THE LEFT WRIST WITHOUT AND WITH CONTRAST
TECHNIQUE: Multiplanar multisequence MR imaging of the left wrist was performed
both before and after the administration of intravenous contrast.
CONTRAST:  20mL MULTIHANCE GADOBENATE DIMEGLUMINE 529 MG/ML IV SOLN

[Series 3: T2 fat-sat · axial · 3.0mm · 0.51mm/px · z∈[-59,+86]mm · 8 of 45 slices shown]
[im 1/45]
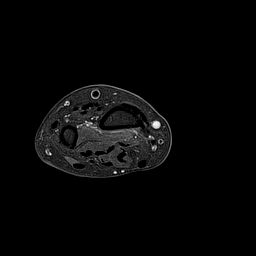
[im 7/45]
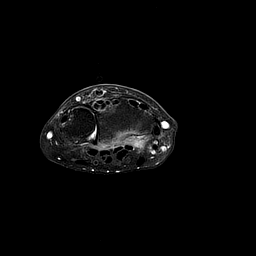
[im 13/45]
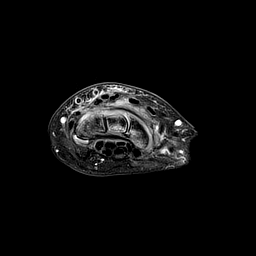
[im 19/45]
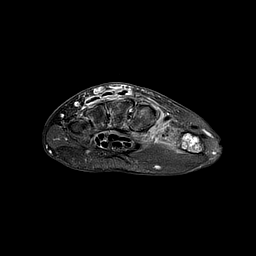
[im 26/45]
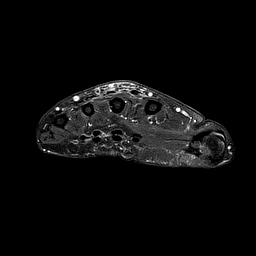
[im 32/45]
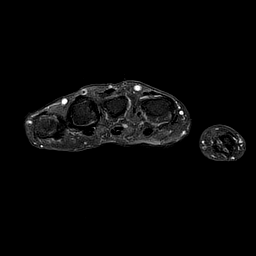
[im 38/45]
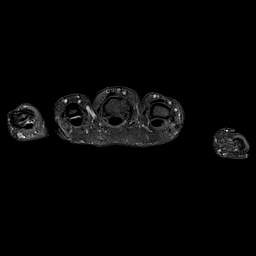
[im 45/45]
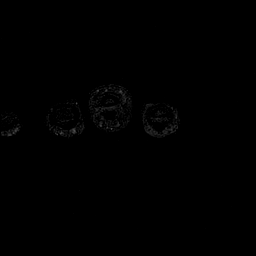

[Series 4: T1 fat-sat · axial · 3.0mm · 0.51mm/px · z∈[-59,+86]mm · 8 of 45 slices shown]
[im 1/45]
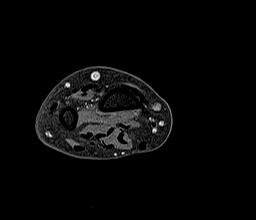
[im 7/45]
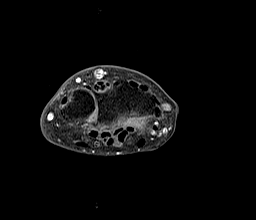
[im 13/45]
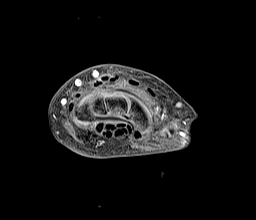
[im 19/45]
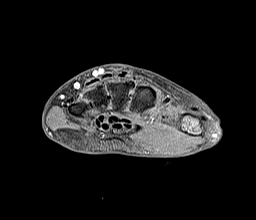
[im 26/45]
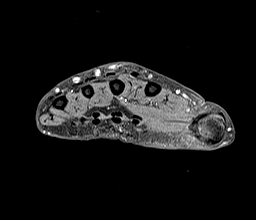
[im 32/45]
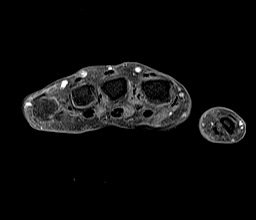
[im 38/45]
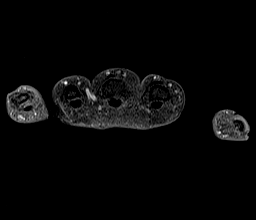
[im 45/45]
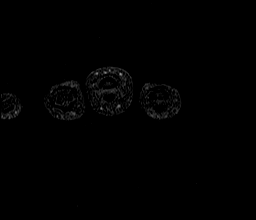

[Series 5: PD fat-sat · sagittal · 3.0mm · 0.27mm/px · 7 of 39 slices shown]
[im 1/39]
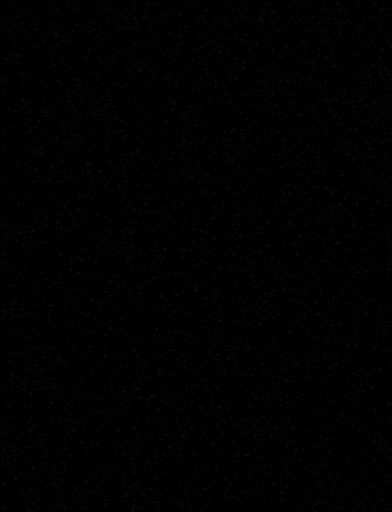
[im 7/39]
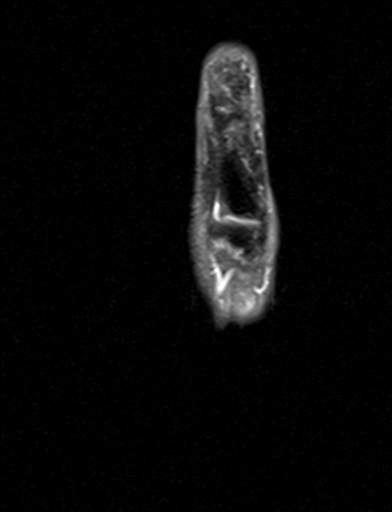
[im 13/39]
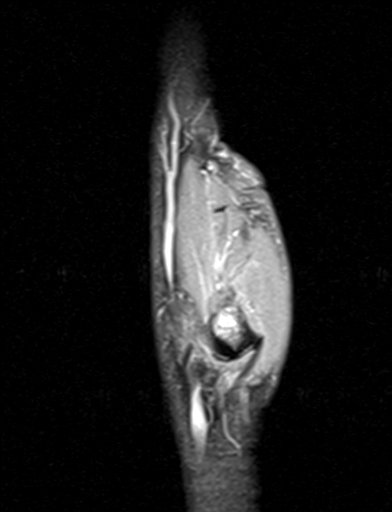
[im 20/39]
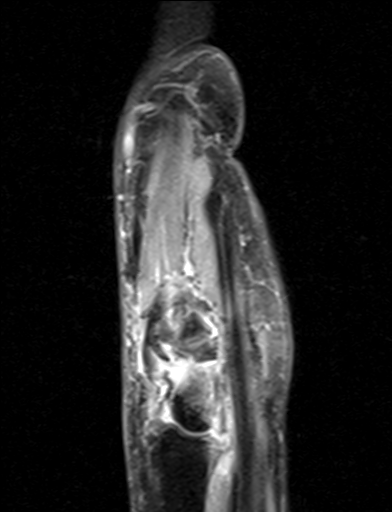
[im 26/39]
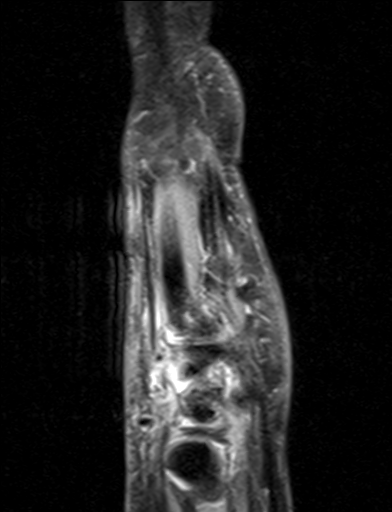
[im 32/39]
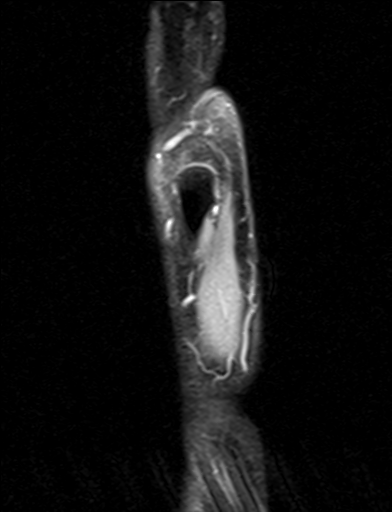
[im 39/39]
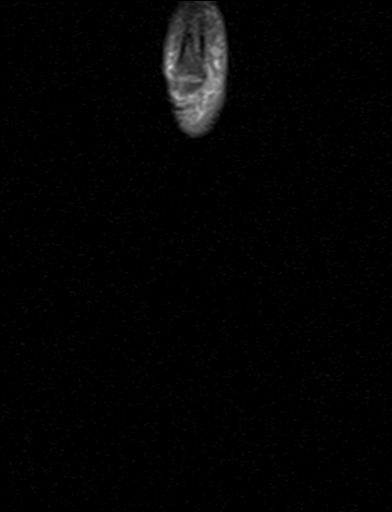

[Series 6: T1 · coronal · 3.0mm · 0.55mm/px · 3 of 17 slices shown]
[im 1/17]
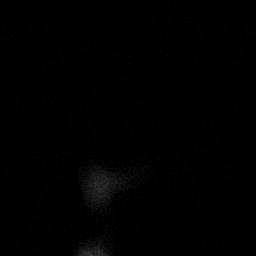
[im 9/17]
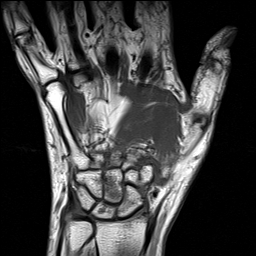
[im 17/17]
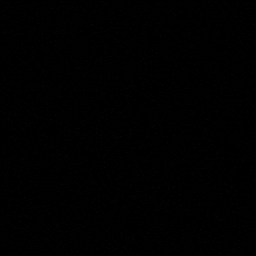

[Series 7: STIR · coronal · 3.0mm · 0.27mm/px · 3 of 17 slices shown]
[im 1/17]
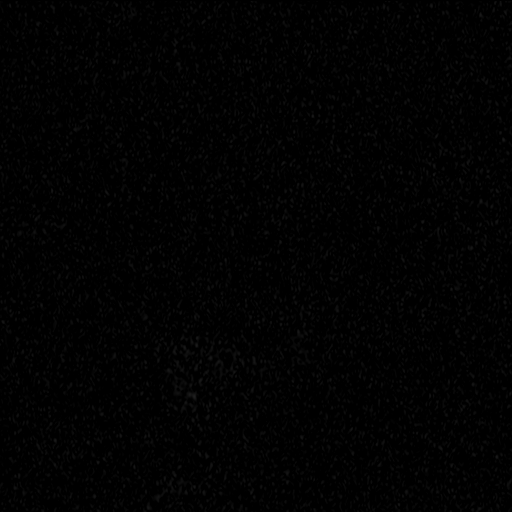
[im 9/17]
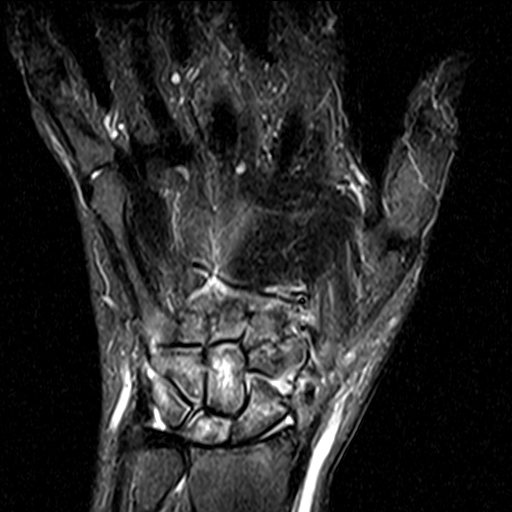
[im 17/17]
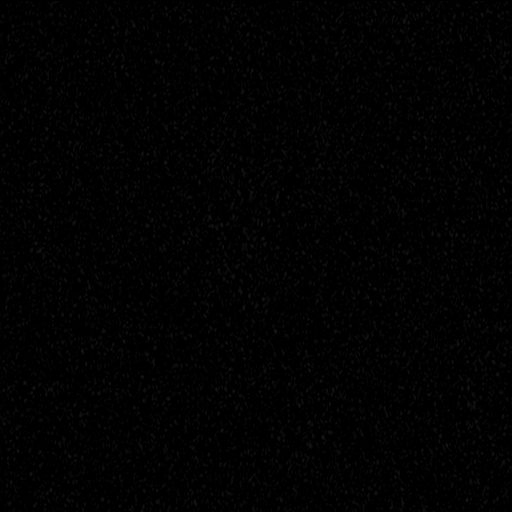

[Series 8: T1 fat-sat post-contrast · axial · 3.0mm · 0.51mm/px · z∈[-59,+86]mm · 8 of 45 slices shown (1 of 2)]
[im 1/45]
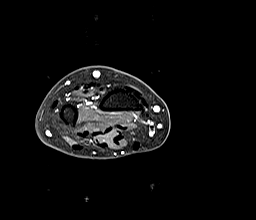
[im 7/45]
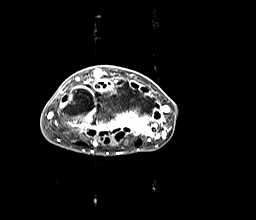
[im 13/45]
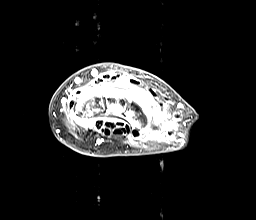
[im 19/45]
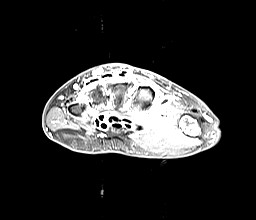
[im 26/45]
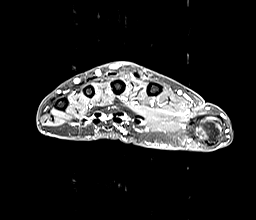
[im 32/45]
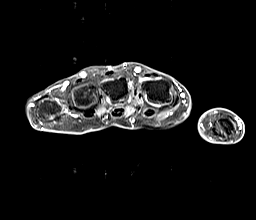
[im 38/45]
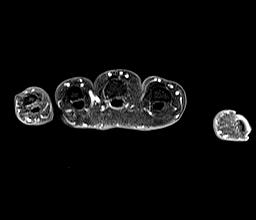
[im 45/45]
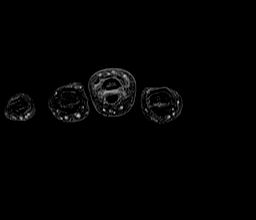

[Series 9: T1 fat-sat post-contrast · coronal · 3.0mm · 0.55mm/px · 3 of 17 slices shown (2 of 2)]
[im 1/17]
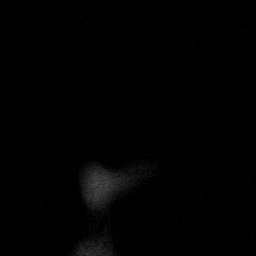
[im 9/17]
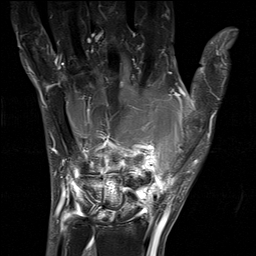
[im 17/17]
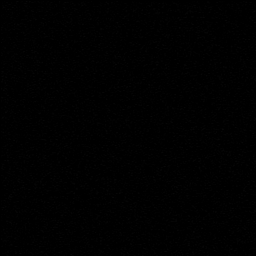

[40 of 40 positions shown; findings below may reference images not displayed]

FINDINGS: Ligaments: Intact lunotriquetral and scapholunate ligament.

Triangular fibrocartilage: Intact TFCC.

Tendons: Intact flexor and extensor compartment tendons.

Carpal tunnel/median nerve: Normal carpal tunnel. Normal median
nerve. Mild tenosynovitis of the extensor digitorum tendons at the
level of the carpus.

Guyon's canal: Normal.

Joint/cartilage: Small radiocarpal and midcarpal joint effusion with
severe synovitis. Severe synovitis of the distal radioulnar joint.
Partial-thickness cartilage loss throughout the carpus. Marrow edema
throughout the carpal bones with possible small erosions involving
the capitate, lunate, scaphoid and trapezoid. Subchondral reactive
marrow edema at the base of the second and third metacarpals.

Bones/carpal alignment: No acute fracture or dislocation. Mild
osteoarthritis of the first CMC joint and first MCP joint. Normal
alignment. Elongated intra medullary bone lesion in the first
metacarpal shaft with T2 hyperintensity, interspersed areas of fatty
signal, and low signal foci with overall heterogeneous enhancement
and endosteal scalloping.

Other: No focal fluid collection or hematoma.
IMPRESSION: 1. Severe synovitis throughout the carpus and distal radioulnar
joint with possible small erosions involving the carpal bones most
concerning for an inflammatory (rheumatoid) versus crystalline
arthropathy (gout), but infection cannot be entirely excluded.
Correlation with laboratory values and clinical exam is recommended.
2. Elongated bone lesion in the first metacarpal shaft with mild
endosteal scalloping, but no associated soft tissue mass or other
aggressive features. The finding is most compatible with an
enchondroma, but correlation with plain x-ray of the hand or thumb
is recommended.

## 2017-02-10 ENCOUNTER — Ambulatory Visit
Admission: EM | Admit: 2017-02-10 | Discharge: 2017-02-10 | Disposition: A | Discharge status: Home / Self Care | Payer: BLUE CROSS/BLUE SHIELD | Attending: Family Medicine | Admitting: Family Medicine

## 2017-02-10 DIAGNOSIS — R350 Frequency of micturition: Secondary | ICD-10-CM | POA: Diagnosis not present

## 2017-02-10 DIAGNOSIS — M545 Low back pain, unspecified: Secondary | ICD-10-CM

## 2017-02-10 LAB — URINALYSIS, COMPLETE (UACMP) WITH MICROSCOPIC
Bilirubin Urine: NEGATIVE
Glucose, UA: NEGATIVE mg/dL
Ketones, ur: NEGATIVE mg/dL
Leukocytes, UA: NEGATIVE
Nitrite: NEGATIVE
PH: 6.5 (ref 5.0–8.0)
Protein, ur: NEGATIVE mg/dL
SPECIFIC GRAVITY, URINE: 1.015 (ref 1.005–1.030)

## 2017-02-10 MED ORDER — CIPROFLOXACIN HCL 500 MG PO TABS: 500.0000 mg | ORAL_TABLET | Freq: Two times a day (BID) | ORAL | 0 refills | Status: DC

## 2017-02-10 NOTE — Discharge Instructions (Signed)
Your symptoms are concerning for UTI although your specimen was not that remarkable.  Take the antibiotic as prescribed.  Follow up if you worsen or fail to improve.  Take care  Dr. Adriana Simas

## 2017-02-10 NOTE — ED Provider Notes (Signed)
[facility]    CSN: [id number] Arrival date & time: 02/10/17  1028  History   Chief Complaint Chief Complaint  Patient presents with  . Urinary Tract Infection   HPI  56 year old female presents with complaints of low back pain, urinary frequency.  Patient reports a two-week history of urinary frequency and urgency. She's had a few episodes of incontinence but for the most part is able to hold her urine. No saddle anesthesia. She reports associated low back pain. She states her low back pain is severe. She is concerned that she has a bladder infection or kidney infection. No associated fevers or chills. No known exacerbating factors. No other associated symptoms. No other complaints or concerns at this time.  PMH - RA, Chronic pain HTN, HLD, Prediabetes  Past Surgical History:  Procedure Laterality Date  . ABDOMINAL HYSTERECTOMY    . CYST EXCISION  1980s  . KNEE ARTHROSCOPY WITH MEDIAL MENISECTOMY Left 08/16/2016   Procedure: KNEE ARTHROSCOPY WITH SYNOVECTOMY, MEDIAL AND LATERAL MENISCUS TEAR, LOOSE BODY;  Surgeon: Kennedy Bucker, MD;  Location: [facility] ORS;  Service: Orthopedics;  Laterality: Left;  . TYMPANOPLASTY     OB History    No data available     Home Medications    Prior to Admission medications   Medication Sig Start Date End Date Taking? Authorizing Provider  B Complex Vitamins (B-COMPLEX/B-12 PO) Take 1 tablet by mouth every morning.   Yes Historical Provider, MD  Calcium Carbonate-Vit D-Min (CALCIUM 1200 PO) Take 1 tablet by mouth every morning.   Yes Historical Provider, MD  Cholecalciferol (VITAMIN D-3) 1000 units CAPS Take 2 capsules by mouth every morning.   Yes Historical Provider, MD  folic acid (FOLVITE) 1 MG tablet Take 1 mg by mouth daily.   Yes Historical Provider, MD  methotrexate (RHEUMATREX) 2.5 MG tablet Take 2.5 mg by mouth once a week. Caution:Chemotherapy. Protect from light.   Yes Historical Provider, MD  Multiple Vitamins-Minerals  (MULTIVITAMIN WITH MINERALS) tablet Take 1 tablet by mouth daily.   Yes Historical Provider, MD  predniSONE (DELTASONE) 5 MG tablet Take 5 mg by mouth 3 (three) times daily.   Yes Historical Provider, MD  ciprofloxacin (CIPRO) 500 MG tablet Take 1 tablet (500 mg total) by mouth every 12 (twelve) hours. 02/10/17   Tommie Sams, DO  diclofenac (VOLTAREN) 75 MG EC tablet Take 75 mg by mouth 4 (four) times daily as needed.    Historical Provider, MD  diclofenac sodium (VOLTAREN) 1 % GEL Apply 2 g topically 4 (four) times daily.    Historical Provider, MD  HYDROcodone-acetaminophen (NORCO) 5-325 MG tablet Take 1-2 tablets by mouth every 6 (six) hours as needed for moderate pain. 08/16/16   Kennedy Bucker, MD  HYDROcodone-acetaminophen (NORCO/VICODIN) 5-325 MG tablet Take 1-2 tablets by mouth every 4 (four) hours as needed for moderate pain.    Historical Provider, MD  ibuprofen (ADVIL,MOTRIN) 800 MG tablet Take 800 mg by mouth every 8 (eight) hours as needed.    Historical Provider, MD    Family History History reviewed. No pertinent family history.  Social History Social History  Substance Use Topics  . Smoking status: Current Every Day Smoker    Packs/day: 1.00    Types: Cigarettes  . Smokeless tobacco: Never Used  . Alcohol use Yes     Comment: rare    Allergies   Lobster [shellfish allergy] and Sulfa antibiotics  Review of Systems Review of Systems  Constitutional: Negative for fever.  Genitourinary: Positive for frequency and urgency.  Musculoskeletal: Positive for back pain.   Physical Exam Triage Vital Signs ED Triage Vitals  Enc Vitals Group     BP 02/10/17 1102 (!) 143/75     Pulse Rate 02/10/17 1102 78     Resp 02/10/17 1102 18     Temp 02/10/17 1102 97.7 F (36.5 C)     Temp Source 02/10/17 1102 Oral     SpO2 02/10/17 1102 97 %     Weight 02/10/17 1103 240 lb (108.9 kg)     Height 02/10/17 1103 5\' 6"  (1.676 m)     Head Circumference --      Peak Flow --      Pain  Score 02/10/17 1104 3     Pain Loc --      Pain Edu? --      Excl. in GC? --    Updated Vital Signs BP (!) 143/75 (BP Location: Left Arm)   Pulse 78   Temp 97.7 F (36.5 C) (Oral)   Resp 18   Ht 5\' 6"  (1.676 m)   Wt 240 lb (108.9 kg)   SpO2 97%   BMI 38.74 kg/m   Physical Exam  Constitutional: She is oriented to person, place, and time. She appears well-developed. No distress.  Cardiovascular: Normal rate and regular rhythm.   Pulmonary/Chest: Effort normal and breath sounds normal.  Abdominal: Soft. She exhibits no distension. There is no tenderness.  Musculoskeletal:  Low back - decreased ROM. Nontender to palpation.  Neurological: She is alert and oriented to person, place, and time.  Psychiatric: She has a normal mood and affect.  Vitals reviewed.  UC Treatments / Results  Labs (all labs ordered are listed, but only abnormal results are displayed) Labs Reviewed  URINALYSIS, COMPLETE (UACMP) WITH MICROSCOPIC - Abnormal; Notable for the following:       Result Value   Hgb urine dipstick SMALL (*)    Squamous Epithelial / LPF 0-5 (*)    Bacteria, UA FEW (*)    All other components within normal limits  URINE CULTURE    EKG  EKG Interpretation None      Radiology No results found.  Procedures Procedures (including critical care time)  Medications Ordered in UC Medications - No data to display  Initial Impression / Assessment and Plan / UC Course  I have reviewed the triage vital signs and the nursing notes.  Pertinent labs & imaging results that were available during my care of the patient were reviewed by me and considered in my medical decision making (see chart for details).    56 year old female presents with concerns for urinary symptoms and low back pain. Small hemoglobin and few bacteria noted on urinalysis. Low back pain does not appear to be severe. Her few episodes of incontinence seems to be secondary to urinary urgency. She is not having  persistent urinary incontinence to suggest cauda equina. I discussed waiting on urine culture versus empiric treatment and patient elected for the latter. Treating with Cipro.  Final Clinical Impressions(s) / UC Diagnoses   Final diagnoses:  Acute midline low back pain without sciatica  Urinary frequency    New Prescriptions New Prescriptions   CIPROFLOXACIN (CIPRO) 500 MG TABLET    Take 1 tablet (500 mg total) by mouth every 12 (twelve) hours.     , DO 02/10/17 1150

## 2017-02-10 NOTE — ED Triage Notes (Signed)
Pt c/o bladder pain and lower back pain for about a week. She is urinating frequently but not much volume. Odor to her urine

## 2017-02-12 LAB — URINE CULTURE

## 2017-07-25 ENCOUNTER — Ambulatory Visit: Admission: RE | Admit: 2017-07-25 | Discharge: 2017-07-25 | Payer: BC Managed Care – PPO | Admitting: Internal Medicine

## 2017-07-25 DIAGNOSIS — R7303 Prediabetes: Secondary | ICD-10-CM

## 2017-07-25 DIAGNOSIS — M0579 Rheumatoid arthritis with rheumatoid factor of multiple sites without organ or systems involvement: Secondary | ICD-10-CM

## 2017-07-25 DIAGNOSIS — M5416 Radiculopathy, lumbar region: Secondary | ICD-10-CM

## 2017-07-25 DIAGNOSIS — F17200 Nicotine dependence, unspecified, uncomplicated: Secondary | ICD-10-CM

## 2017-07-25 DIAGNOSIS — R05 Cough: Secondary | ICD-10-CM

## 2017-07-25 DIAGNOSIS — I10 Essential (primary) hypertension: Principal | ICD-10-CM

## 2017-07-25 MED ORDER — ALBUTEROL SULFATE HFA 90 MCG/ACTUATION AEROSOL INHALER
Freq: Four times a day (QID) | RESPIRATORY_TRACT | 3 refills | 0.00000 days | Status: CP | PRN
Start: 2017-07-25 — End: 2019-04-23

## 2017-07-25 MED ORDER — AMOXICILLIN 500 MG CAPSULE
ORAL_CAPSULE | Freq: Three times a day (TID) | ORAL | 0 refills | 0 days | Status: CP
Start: 2017-07-25 — End: 2017-08-04

## 2017-07-25 MED ORDER — HYDROCHLOROTHIAZIDE 25 MG TABLET
ORAL_TABLET | Freq: Every day | ORAL | 3 refills | 0.00000 days | Status: CP
Start: 2017-07-25 — End: 2018-07-15

## 2017-08-29 ENCOUNTER — Ambulatory Visit
Admission: RE | Admit: 2017-08-29 | Discharge: 2017-08-29 | Disposition: A | Discharge status: Home / Self Care | Payer: BC Managed Care – PPO | Admitting: Internal Medicine

## 2017-08-29 ENCOUNTER — Ambulatory Visit
Admission: RE | Admit: 2017-08-29 | Discharge: 2017-08-29 | Disposition: A | Discharge status: Home / Self Care | Payer: BC Managed Care – PPO

## 2017-08-29 DIAGNOSIS — F17200 Nicotine dependence, unspecified, uncomplicated: Secondary | ICD-10-CM

## 2017-08-29 DIAGNOSIS — Z1231 Encounter for screening mammogram for malignant neoplasm of breast: Secondary | ICD-10-CM

## 2017-08-29 DIAGNOSIS — M0579 Rheumatoid arthritis with rheumatoid factor of multiple sites without organ or systems involvement: Secondary | ICD-10-CM

## 2017-08-29 DIAGNOSIS — I10 Essential (primary) hypertension: Principal | ICD-10-CM

## 2017-08-29 DIAGNOSIS — R300 Dysuria: Secondary | ICD-10-CM

## 2017-08-29 DIAGNOSIS — R05 Cough: Secondary | ICD-10-CM

## 2017-08-30 MED ORDER — SULFAMETHOXAZOLE 800 MG-TRIMETHOPRIM 160 MG TABLET
ORAL_TABLET | Freq: Two times a day (BID) | ORAL | 0 refills | 0 days | Status: CP
Start: 2017-08-30 — End: 2017-09-02

## 2017-09-24 ENCOUNTER — Ambulatory Visit: Admission: RE | Admit: 2017-09-24 | Discharge: 2017-09-24 | Payer: BC Managed Care – PPO

## 2017-09-24 ENCOUNTER — Ambulatory Visit
Admission: RE | Admit: 2017-09-24 | Discharge: 2017-09-24 | Disposition: A | Discharge status: Home / Self Care | Payer: BC Managed Care – PPO

## 2017-09-24 DIAGNOSIS — J449 Chronic obstructive pulmonary disease, unspecified: Secondary | ICD-10-CM

## 2017-09-24 DIAGNOSIS — R05 Cough: Secondary | ICD-10-CM

## 2017-09-24 DIAGNOSIS — F17200 Nicotine dependence, unspecified, uncomplicated: Principal | ICD-10-CM

## 2017-09-24 DIAGNOSIS — Z1231 Encounter for screening mammogram for malignant neoplasm of breast: Principal | ICD-10-CM

## 2017-12-05 ENCOUNTER — Ambulatory Visit: Admission: RE | Admit: 2017-12-05 | Discharge: 2017-12-05 | Payer: BC Managed Care – PPO | Admitting: Internal Medicine

## 2017-12-05 DIAGNOSIS — R05 Cough: Secondary | ICD-10-CM

## 2017-12-05 DIAGNOSIS — F17219 Nicotine dependence, cigarettes, with unspecified nicotine-induced disorders: Secondary | ICD-10-CM

## 2017-12-05 DIAGNOSIS — I10 Essential (primary) hypertension: Principal | ICD-10-CM

## 2018-01-21 MED ORDER — TIOTROPIUM BROMIDE 18 MCG CAPSULE WITH INHALATION DEVICE
ORAL_CAPSULE | Freq: Every day | RESPIRATORY_TRACT | 3 refills | 0.00000 days | Status: CP
Start: 2018-01-21 — End: 2018-01-22

## 2018-01-22 MED ORDER — TIOTROPIUM BROMIDE 2.5 MCG/ACTUATION MIST FOR INHALATION
Freq: Every day | RESPIRATORY_TRACT | 11 refills | 0.00000 days | Status: CP
Start: 2018-01-22 — End: 2018-09-11

## 2018-03-06 ENCOUNTER — Encounter: Admit: 2018-03-06 | Discharge: 2018-03-07 | Payer: PRIVATE HEALTH INSURANCE

## 2018-03-06 DIAGNOSIS — M7989 Other specified soft tissue disorders: Principal | ICD-10-CM

## 2018-03-06 DIAGNOSIS — R7303 Prediabetes: Secondary | ICD-10-CM

## 2018-03-06 DIAGNOSIS — H00011 Hordeolum externum right upper eyelid: Secondary | ICD-10-CM

## 2018-03-06 DIAGNOSIS — J449 Chronic obstructive pulmonary disease, unspecified: Secondary | ICD-10-CM

## 2018-03-06 DIAGNOSIS — F17210 Nicotine dependence, cigarettes, uncomplicated: Secondary | ICD-10-CM

## 2018-03-06 MED ORDER — TIOTROPIUM BROMIDE 18 MCG CAPSULE WITH INHALATION DEVICE
ORAL_CAPSULE | Freq: Every day | RESPIRATORY_TRACT | 11 refills | 0 days | Status: CP
Start: 2018-03-06 — End: 2018-09-11

## 2018-07-15 MED ORDER — HYDROCHLOROTHIAZIDE 25 MG TABLET
ORAL_TABLET | 5 refills | 0 days | Status: CP
Start: 2018-07-15 — End: 2019-01-08

## 2018-08-04 ENCOUNTER — Ambulatory Visit (INDEPENDENT_AMBULATORY_CARE_PROVIDER_SITE_OTHER): Payer: BLUE CROSS/BLUE SHIELD

## 2018-08-04 ENCOUNTER — Encounter: Payer: Self-pay | Admitting: Podiatry

## 2018-08-04 ENCOUNTER — Other Ambulatory Visit: Payer: Self-pay | Admitting: Podiatry

## 2018-08-04 ENCOUNTER — Ambulatory Visit (INDEPENDENT_AMBULATORY_CARE_PROVIDER_SITE_OTHER): Payer: BLUE CROSS/BLUE SHIELD | Admitting: Podiatry

## 2018-08-04 VITALS — BP 127/81 | HR 81

## 2018-08-04 DIAGNOSIS — R52 Pain, unspecified: Secondary | ICD-10-CM

## 2018-08-04 DIAGNOSIS — M79671 Pain in right foot: Secondary | ICD-10-CM | POA: Diagnosis not present

## 2018-08-04 DIAGNOSIS — S93601A Unspecified sprain of right foot, initial encounter: Secondary | ICD-10-CM | POA: Diagnosis not present

## 2018-08-04 NOTE — Progress Notes (Signed)
This patient presents the office with chief complaint of pain noted on the outside of her right foot.  She says that she has been experiencing pain on the outside  her right foot for months.  She says she has been seen by an orthopedic office as well as [facility].  Both these facilities have recommended injection therapy at the site of her painful right foot.  She says that she was not interested in temporary relief and presents the office today for an evaluation and treatment.  She states she history of trauma or reinjury to the foot.  She points to an area on the outside of her right rear foot as the site of pain. She says this pain has been present for 6 months.  She presents the office today for an evaluation and treatment of her right foot.   General Appearance  Alert, conversant and in no acute stress.  Vascular  Dorsalis pedis and posterior tibial  pulses are palpable  bilaterally.  Capillary return is within normal limits  bilaterally. Temperature is within normal limits  bilaterally.  Neurologic  Senn-Weinstein monofilament wire test within normal limits  bilaterally. Muscle power within normal limits bilaterally.  Nails Normal nails noted with no evidence of fungal or bacterial infection.  Orthopedic  No limitations of motion of motion feet .  No crepitus or effusions noted.  No bony pathology or digital deformities noted. Palpable pain at the level of the calcaneal-cuboid joint right foot.  Pain elicited on eversion right foot.  Skin  normotropic skin with no porokeratosis noted bilaterally.  No signs of infections or ulcers noted.    Foot sprain right foot.   Cuboid syndrome right foot.  IE.  X-rays reveal calcification at the insertion plantar fascia, right foot.  There is no evidence of an osteophyte at the talonavicular joint, right foot.  X-rays of the right foot reveal no evidence of any bony pathology.  Discussed this condition with this patient.  She is not isted in  injection therapy today.  Prescribed Mobic  as well as power step insoles for her shoes.  Reevaluation of the x-rays do not reveal the presence of a dorsal exostosis at the talus right foot.  I will discuss this with this patient and recommend an MRI to be performed to evaluate the enlarged hard mass on the anterior aspect right ankle.  RTC 4 weeks.   Helane Gunther DPM

## 2018-08-06 ENCOUNTER — Telehealth: Payer: Self-pay | Admitting: Podiatry

## 2018-08-06 MED ORDER — MELOXICAM 7.5 MG PO TABS: 7.5000 mg | ORAL_TABLET | Freq: Every day | ORAL | 0 refills | Status: DC

## 2018-08-06 NOTE — Telephone Encounter (Signed)
Patient notified that her medication has been sent to pharmacy

## 2018-08-06 NOTE — Telephone Encounter (Signed)
Mrs. Krawczyk said that Dr. Stacie Acres was suppose to send her medication to the pharmacy and they have not received anything.

## 2018-09-04 ENCOUNTER — Encounter: Payer: Self-pay | Admitting: Podiatry

## 2018-09-04 ENCOUNTER — Ambulatory Visit (INDEPENDENT_AMBULATORY_CARE_PROVIDER_SITE_OTHER): Payer: BLUE CROSS/BLUE SHIELD | Admitting: Podiatry

## 2018-09-04 DIAGNOSIS — S93601A Unspecified sprain of right foot, initial encounter: Secondary | ICD-10-CM | POA: Diagnosis not present

## 2018-09-04 NOTE — Progress Notes (Signed)
This patient returns to the office for continued pain noted on the outside of her right foot.  She says that she has been wearing her insoles and taking the Mobic.  She says she still feels pain on the outside of her foot and at times it is no better.  Patient does have last pain and discomfort upon palpation on the outside of her right foot.  She says that her foot continues to be painful and she desires further evaluation and treatment of her right foot.  Patient has been diagnosed  with a foot sprain, right foot as well as a cuboid syndrome  right foot.  Vascular  Dorsalis pedis and posterior tibial pulses are palpable  B/L.  Capillary return  WNL.  Temperature gradient is  WNL.  Skin turgor  WNL  Sensorium  Senn Weinstein monofilament wire  WNL. Normal tactile sensation.  Nail Exam  Patient has normal nails with no evidence of bacterial or fungal infection.  Orthopedic  Exam  Muscle tone and muscle strength  WNL.  No limitations of motion feet  B/L.  No crepitus or joint effusion noted.  Foot type is unremarkable and digits show no abnormalities.  Bony prominences over dorsum of right rearfoot.  Decreased palpable pain cuboid right foot.    Skin  No open lesions.  Normal skin texture and turgor.  Foot sprain right foot.  Cuboid syndrome right foot.    ROV>  My examination of her right foot does reveal decreased pain on the right foot.  Patient states that she feels she still experiencing significant pain and discomfort in her right foot. During gait.  Discussed this condition with this patient.  We discussed taking an MI of her right foot versus application of an Unna boot right foot.  She opted for the application of the Unna boot, right foot.  Cam walker dispensed.  RTC 10 days.   Helane Gunther DPM

## 2018-09-11 ENCOUNTER — Encounter: Admit: 2018-09-11 | Discharge: 2018-09-12 | Payer: PRIVATE HEALTH INSURANCE

## 2018-09-11 DIAGNOSIS — J449 Chronic obstructive pulmonary disease, unspecified: Principal | ICD-10-CM

## 2018-09-11 DIAGNOSIS — Z833 Family history of diabetes mellitus: Secondary | ICD-10-CM

## 2018-09-11 DIAGNOSIS — Z1231 Encounter for screening mammogram for malignant neoplasm of breast: Secondary | ICD-10-CM

## 2018-09-11 DIAGNOSIS — R7303 Prediabetes: Secondary | ICD-10-CM

## 2018-09-11 DIAGNOSIS — I10 Essential (primary) hypertension: Secondary | ICD-10-CM

## 2018-09-11 DIAGNOSIS — F17200 Nicotine dependence, unspecified, uncomplicated: Secondary | ICD-10-CM

## 2018-09-11 DIAGNOSIS — Z23 Encounter for immunization: Secondary | ICD-10-CM

## 2018-09-11 MED ORDER — FLUTICASONE 500 MCG-SALMETEROL 50 MCG/DOSE BLISTR POWDR FOR INHALATION
Freq: Two times a day (BID) | RESPIRATORY_TRACT | 11 refills | 0.00000 days | Status: CP
Start: 2018-09-11 — End: 2019-09-11

## 2018-09-12 MED ORDER — VARENICLINE 0.5 MG TABLET
ORAL_TABLET | 0 refills | 0 days | Status: CP
Start: 2018-09-12 — End: 2018-10-09

## 2018-09-15 ENCOUNTER — Encounter: Payer: Self-pay | Admitting: Podiatry

## 2018-09-15 ENCOUNTER — Ambulatory Visit (INDEPENDENT_AMBULATORY_CARE_PROVIDER_SITE_OTHER): Payer: BLUE CROSS/BLUE SHIELD | Admitting: Podiatry

## 2018-09-15 DIAGNOSIS — S93601A Unspecified sprain of right foot, initial encounter: Secondary | ICD-10-CM

## 2018-09-15 NOTE — Progress Notes (Signed)
This patient returns to the office for continued evaluation and treatment of her right foot.  She had pain noted around the cuboid on the right foot.  She was treated with Mobic and power step insole but the pain persisted.  We decided to allow her to wear an Unna boot and a cam walker for 10 days and then to reevaluate her foot.  She presents the office today stating that she took off the Unna boot this morning and that she is not having any pain or discomfort.  She says she is tired of wearing her cam walker.  She presents the office today for continued evaluation and treatment of her right foot  Vascular  Dorsalis pedis and posterior tibial pulses are palpable  B/L.  Capillary return  WNL.  Temperature gradient is  WNL.  Skin turgor  WNL  Sensorium  Senn Weinstein monofilament wire  WNL. Normal tactile sensation.  Nail Exam  Patient has normal nails with no evidence of bacterial or fungal infection.  Orthopedic  Exam  Muscle tone and muscle strength  WNL.  No limitations of motion feet  B/L.  No crepitus or joint effusion noted.  Foot type is unremarkable and digits show no abnormalities.  Bony prominences over dorsum of right rearfoot.  Decreased palpable pain cuboid right foot.    Skin  No open lesions.  Normal skin texture and turgor.  Foot sprain right foot.  Cuboid syndrome right foot.    ROV>  My examination of her right foot does reveals no evidence of any pain or swelling over the cuboid bone right foot.  Patient was told to continue to take the Mobic as needed.  She is also to continue to walk with her power step insoles.  As this patient left she stated she expected her pain to return.  Return to the clinic as needed pain.   Helane Gunther DPM

## 2018-09-30 ENCOUNTER — Ambulatory Visit: Admit: 2018-09-30 | Discharge: 2018-10-01 | Payer: PRIVATE HEALTH INSURANCE

## 2018-09-30 DIAGNOSIS — Z1231 Encounter for screening mammogram for malignant neoplasm of breast: Principal | ICD-10-CM

## 2018-10-09 ENCOUNTER — Encounter: Admit: 2018-10-09 | Discharge: 2018-10-10 | Payer: PRIVATE HEALTH INSURANCE

## 2018-10-09 DIAGNOSIS — H9190 Unspecified hearing loss, unspecified ear: Secondary | ICD-10-CM

## 2018-10-09 DIAGNOSIS — F17200 Nicotine dependence, unspecified, uncomplicated: Principal | ICD-10-CM

## 2018-10-09 DIAGNOSIS — J449 Chronic obstructive pulmonary disease, unspecified: Secondary | ICD-10-CM

## 2018-10-09 DIAGNOSIS — E119 Type 2 diabetes mellitus without complications: Secondary | ICD-10-CM

## 2018-10-09 MED ORDER — VARENICLINE 1 MG TABLET
ORAL_TABLET | Freq: Two times a day (BID) | ORAL | 1 refills | 0.00000 days | Status: CP
Start: 2018-10-09 — End: 2018-12-18

## 2018-11-06 ENCOUNTER — Encounter: Admit: 2018-11-06 | Discharge: 2018-11-06 | Payer: PRIVATE HEALTH INSURANCE

## 2018-11-06 DIAGNOSIS — H9190 Unspecified hearing loss, unspecified ear: Principal | ICD-10-CM

## 2018-11-06 DIAGNOSIS — H900 Conductive hearing loss, bilateral: Principal | ICD-10-CM

## 2018-11-06 DIAGNOSIS — H9011 Conductive hearing loss, unilateral, right ear, with unrestricted hearing on the contralateral side: Secondary | ICD-10-CM

## 2018-12-18 ENCOUNTER — Encounter: Admit: 2018-12-18 | Discharge: 2018-12-19 | Payer: PRIVATE HEALTH INSURANCE

## 2018-12-18 DIAGNOSIS — J449 Chronic obstructive pulmonary disease, unspecified: Principal | ICD-10-CM

## 2018-12-18 DIAGNOSIS — M7989 Other specified soft tissue disorders: Secondary | ICD-10-CM

## 2018-12-18 MED ORDER — VARENICLINE 1 MG TABLET
ORAL_TABLET | Freq: Two times a day (BID) | ORAL | 1 refills | 0.00000 days | Status: CP
Start: 2018-12-18 — End: ?

## 2018-12-18 MED ORDER — AMOXICILLIN 875 MG TABLET
ORAL_TABLET | Freq: Two times a day (BID) | ORAL | 0 refills | 0 days | Status: CP
Start: 2018-12-18 — End: 2019-03-16

## 2019-01-08 MED ORDER — HYDROCHLOROTHIAZIDE 25 MG TABLET
ORAL_TABLET | 5 refills | 0 days | Status: CP
Start: 2019-01-08 — End: 2019-06-09

## 2019-02-26 ENCOUNTER — Encounter: Payer: Self-pay | Admitting: Gynecology

## 2019-02-26 ENCOUNTER — Other Ambulatory Visit: Payer: Self-pay

## 2019-02-26 ENCOUNTER — Ambulatory Visit
Admission: EM | Admit: 2019-02-26 | Discharge: 2019-02-26 | Discharge status: Against Medical Advice | Payer: BLUE CROSS/BLUE SHIELD | Attending: Emergency Medicine | Admitting: Emergency Medicine

## 2019-02-26 DIAGNOSIS — J029 Acute pharyngitis, unspecified: Secondary | ICD-10-CM

## 2019-02-26 DIAGNOSIS — R0981 Nasal congestion: Secondary | ICD-10-CM

## 2019-02-26 HISTORY — DX: Essential (primary) hypertension: I10

## 2019-02-26 HISTORY — DX: Chronic obstructive pulmonary disease, unspecified: J44.9

## 2019-02-26 LAB — RAPID STREP SCREEN (MED CTR MEBANE ONLY): Streptococcus, Group A Screen (Direct): NEGATIVE

## 2019-02-26 NOTE — ED Triage Notes (Signed)
Per patient c/o head congestion and now with sore throat.

## 2019-03-01 LAB — CULTURE, GROUP A STREP (THRC)

## 2019-03-16 DIAGNOSIS — K047 Periapical abscess without sinus: Principal | ICD-10-CM

## 2019-03-16 MED ORDER — AMOXICILLIN 875 MG TABLET
ORAL_TABLET | Freq: Two times a day (BID) | ORAL | 0 refills | 0.00000 days | Status: CP
Start: 2019-03-16 — End: 2019-04-23

## 2019-04-23 ENCOUNTER — Encounter: Admit: 2019-04-23 | Discharge: 2019-04-24 | Payer: PRIVATE HEALTH INSURANCE

## 2019-04-23 DIAGNOSIS — F17200 Nicotine dependence, unspecified, uncomplicated: Secondary | ICD-10-CM

## 2019-04-23 DIAGNOSIS — M0579 Rheumatoid arthritis with rheumatoid factor of multiple sites without organ or systems involvement: Secondary | ICD-10-CM

## 2019-04-23 DIAGNOSIS — E119 Type 2 diabetes mellitus without complications: Secondary | ICD-10-CM

## 2019-04-23 DIAGNOSIS — K047 Periapical abscess without sinus: Secondary | ICD-10-CM

## 2019-04-23 DIAGNOSIS — J449 Chronic obstructive pulmonary disease, unspecified: Principal | ICD-10-CM

## 2019-04-23 MED ORDER — AMOXICILLIN 875 MG TABLET
ORAL_TABLET | Freq: Two times a day (BID) | ORAL | 0 refills | 0.00000 days | Status: CP
Start: 2019-04-23 — End: ?

## 2019-04-23 MED ORDER — ALBUTEROL SULFATE HFA 90 MCG/ACTUATION AEROSOL INHALER
Freq: Four times a day (QID) | RESPIRATORY_TRACT | 3 refills | 0.00000 days | Status: CP | PRN
Start: 2019-04-23 — End: 2020-04-22

## 2019-06-09 MED ORDER — HYDROCHLOROTHIAZIDE 25 MG TABLET
ORAL_TABLET | Freq: Every day | ORAL | 5 refills | 0 days | Status: CP
Start: 2019-06-09 — End: ?

## 2019-06-30 ENCOUNTER — Ambulatory Visit: Admit: 2019-06-30 | Discharge: 2019-07-01 | Payer: PRIVATE HEALTH INSURANCE

## 2019-06-30 DIAGNOSIS — M0579 Rheumatoid arthritis with rheumatoid factor of multiple sites without organ or systems involvement: Secondary | ICD-10-CM

## 2019-06-30 DIAGNOSIS — E119 Type 2 diabetes mellitus without complications: Principal | ICD-10-CM

## 2019-09-29 ENCOUNTER — Ambulatory Visit: Admit: 2019-09-29 | Discharge: 2019-09-30 | Payer: PRIVATE HEALTH INSURANCE

## 2019-10-15 DIAGNOSIS — R509 Fever, unspecified: Principal | ICD-10-CM

## 2019-10-15 MED ORDER — AMOXICILLIN 875 MG TABLET
ORAL_TABLET | Freq: Two times a day (BID) | ORAL | 0 refills | 7.00000 days | Status: CP
Start: 2019-10-15 — End: ?

## 2019-10-16 ENCOUNTER — Encounter: Admit: 2019-10-16 | Discharge: 2019-10-17 | Payer: PRIVATE HEALTH INSURANCE | Attending: Family | Primary: Family

## 2019-11-09 ENCOUNTER — Encounter: Admit: 2019-11-09 | Discharge: 2019-11-09 | Payer: PRIVATE HEALTH INSURANCE

## 2019-11-09 ENCOUNTER — Ambulatory Visit: Admit: 2019-11-09 | Discharge: 2019-11-10 | Payer: PRIVATE HEALTH INSURANCE

## 2019-11-09 MED ORDER — METRONIDAZOLE 500 MG TABLET
ORAL_TABLET | Freq: Three times a day (TID) | ORAL | 0 refills | 10 days | Status: CP
Start: 2019-11-09 — End: 2019-11-19

## 2019-11-19 ENCOUNTER — Encounter: Admit: 2019-11-19 | Discharge: 2019-11-20 | Payer: PRIVATE HEALTH INSURANCE

## 2019-12-02 ENCOUNTER — Encounter
Admit: 2019-12-02 | Discharge: 2019-12-03 | Payer: PRIVATE HEALTH INSURANCE | Attending: Obstetrics & Gynecology | Primary: Obstetrics & Gynecology

## 2019-12-02 DIAGNOSIS — N898 Other specified noninflammatory disorders of vagina: Principal | ICD-10-CM

## 2019-12-04 DIAGNOSIS — N898 Other specified noninflammatory disorders of vagina: Principal | ICD-10-CM

## 2019-12-04 MED ORDER — METRONIDAZOLE 500 MG TABLET
ORAL_TABLET | Freq: Two times a day (BID) | ORAL | 0 refills | 5.00000 days | Status: CP
Start: 2019-12-04 — End: 2019-12-09

## 2019-12-07 DIAGNOSIS — I10 Essential (primary) hypertension: Principal | ICD-10-CM

## 2019-12-07 MED ORDER — HYDROCHLOROTHIAZIDE 25 MG TABLET
ORAL_TABLET | 1 refills | 0 days | Status: CP
Start: 2019-12-07 — End: ?

## 2020-02-26 ENCOUNTER — Encounter
Admit: 2020-02-26 | Discharge: 2020-02-27 | Payer: PRIVATE HEALTH INSURANCE | Attending: Internal Medicine | Primary: Internal Medicine

## 2020-02-26 DIAGNOSIS — M5430 Sciatica, unspecified side: Principal | ICD-10-CM

## 2020-02-26 DIAGNOSIS — M0579 Rheumatoid arthritis with rheumatoid factor of multiple sites without organ or systems involvement: Principal | ICD-10-CM

## 2020-03-01 ENCOUNTER — Ambulatory Visit: Admit: 2020-03-01 | Discharge: 2020-03-02 | Payer: PRIVATE HEALTH INSURANCE

## 2020-03-01 ENCOUNTER — Encounter: Admit: 2020-03-01 | Discharge: 2020-03-02 | Payer: PRIVATE HEALTH INSURANCE

## 2020-03-02 DIAGNOSIS — M0579 Rheumatoid arthritis with rheumatoid factor of multiple sites without organ or systems involvement: Principal | ICD-10-CM

## 2020-03-02 MED ORDER — HYDROXYCHLOROQUINE 200 MG TABLET
ORAL_TABLET | Freq: Two times a day (BID) | ORAL | 3 refills | 90.00000 days | Status: CP
Start: 2020-03-02 — End: 2021-03-02

## 2020-05-17 ENCOUNTER — Encounter
Admit: 2020-05-17 | Discharge: 2020-05-18 | Payer: PRIVATE HEALTH INSURANCE | Attending: Internal Medicine | Primary: Internal Medicine

## 2020-05-17 DIAGNOSIS — M0579 Rheumatoid arthritis with rheumatoid factor of multiple sites without organ or systems involvement: Principal | ICD-10-CM

## 2020-05-17 DIAGNOSIS — M19071 Primary osteoarthritis, right ankle and foot: Principal | ICD-10-CM

## 2020-05-17 MED ORDER — HYDROXYCHLOROQUINE 200 MG TABLET
ORAL_TABLET | Freq: Every day | ORAL | 3 refills | 90.00000 days | Status: CP
Start: 2020-05-17 — End: 2021-05-17

## 2020-06-02 DIAGNOSIS — I10 Essential (primary) hypertension: Principal | ICD-10-CM

## 2020-06-02 MED ORDER — HYDROCHLOROTHIAZIDE 25 MG TABLET
ORAL_TABLET | 1 refills | 0 days | Status: CP
Start: 2020-06-02 — End: ?

## 2020-06-17 ENCOUNTER — Encounter
Admit: 2020-06-17 | Discharge: 2020-07-16 | Payer: PRIVATE HEALTH INSURANCE | Attending: Rehabilitative and Restorative Service Providers" | Primary: Rehabilitative and Restorative Service Providers"

## 2020-07-04 DIAGNOSIS — G8929 Other chronic pain: Principal | ICD-10-CM

## 2020-07-04 DIAGNOSIS — M25532 Pain in left wrist: Principal | ICD-10-CM

## 2020-07-14 MED ORDER — PENICILLIN V POTASSIUM 500 MG TABLET
ORAL_TABLET | Freq: Three times a day (TID) | ORAL | 0 refills | 10 days | Status: CP
Start: 2020-07-14 — End: ?

## 2020-08-03 ENCOUNTER — Encounter
Admit: 2020-08-03 | Discharge: 2020-08-04 | Payer: PRIVATE HEALTH INSURANCE | Attending: Orthopaedic Surgery | Primary: Orthopaedic Surgery

## 2020-08-03 DIAGNOSIS — M25532 Pain in left wrist: Principal | ICD-10-CM

## 2020-08-03 DIAGNOSIS — G8929 Other chronic pain: Secondary | ICD-10-CM

## 2020-08-15 MED ORDER — PENICILLIN V POTASSIUM 500 MG TABLET
ORAL_TABLET | Freq: Three times a day (TID) | ORAL | 0 refills | 10 days | Status: CP
Start: 2020-08-15 — End: ?

## 2020-09-22 ENCOUNTER — Encounter: Admit: 2020-09-22 | Discharge: 2020-09-23 | Payer: PRIVATE HEALTH INSURANCE

## 2020-09-29 ENCOUNTER — Ambulatory Visit
Admit: 2020-09-29 | Discharge: 2020-09-30 | Payer: PRIVATE HEALTH INSURANCE | Attending: Internal Medicine | Primary: Internal Medicine

## 2020-09-29 ENCOUNTER — Ambulatory Visit: Admit: 2020-09-29 | Discharge: 2020-09-30 | Payer: PRIVATE HEALTH INSURANCE

## 2020-09-29 ENCOUNTER — Ambulatory Visit
Admit: 2020-09-29 | Discharge: 2020-09-30 | Payer: PRIVATE HEALTH INSURANCE | Attending: Student in an Organized Health Care Education/Training Program | Primary: Student in an Organized Health Care Education/Training Program

## 2020-09-29 DIAGNOSIS — E119 Type 2 diabetes mellitus without complications: Principal | ICD-10-CM

## 2020-09-29 DIAGNOSIS — M0579 Rheumatoid arthritis with rheumatoid factor of multiple sites without organ or systems involvement: Principal | ICD-10-CM

## 2020-09-29 MED ORDER — LEFLUNOMIDE 20 MG TABLET
ORAL_TABLET | Freq: Every day | ORAL | 3 refills | 90 days | Status: CP
Start: 2020-09-29 — End: 2021-09-29

## 2020-10-03 MED ORDER — LEFLUNOMIDE 20 MG TABLET
ORAL_TABLET | Freq: Every day | ORAL | 3 refills | 90 days | Status: CP
Start: 2020-10-03 — End: 2021-10-03

## 2020-10-11 ENCOUNTER — Ambulatory Visit: Admit: 2020-10-11 | Discharge: 2020-10-12 | Payer: PRIVATE HEALTH INSURANCE

## 2020-10-11 DIAGNOSIS — J449 Chronic obstructive pulmonary disease, unspecified: Principal | ICD-10-CM

## 2020-10-11 DIAGNOSIS — E119 Type 2 diabetes mellitus without complications: Principal | ICD-10-CM

## 2020-10-11 DIAGNOSIS — I10 Essential (primary) hypertension: Principal | ICD-10-CM

## 2020-10-11 DIAGNOSIS — Z1231 Encounter for screening mammogram for malignant neoplasm of breast: Principal | ICD-10-CM

## 2020-10-11 DIAGNOSIS — M0579 Rheumatoid arthritis with rheumatoid factor of multiple sites without organ or systems involvement: Principal | ICD-10-CM

## 2020-10-11 MED ORDER — TIOTROPIUM BROMIDE 18 MCG CAPSULE WITH INHALATION DEVICE
ORAL_CAPSULE | Freq: Every day | RESPIRATORY_TRACT | 11 refills | 30 days | Status: CP
Start: 2020-10-11 — End: 2021-10-11

## 2020-10-11 MED ORDER — ALBUTEROL SULFATE HFA 90 MCG/ACTUATION AEROSOL INHALER
Freq: Four times a day (QID) | RESPIRATORY_TRACT | 11 refills | 0 days | Status: CP | PRN
Start: 2020-10-11 — End: ?

## 2020-10-11 MED ORDER — HYDROCHLOROTHIAZIDE 25 MG TABLET
ORAL_TABLET | Freq: Every day | ORAL | 3 refills | 90.00000 days | Status: CP
Start: 2020-10-11 — End: ?

## 2020-12-29 ENCOUNTER — Encounter: Admit: 2020-12-29 | Discharge: 2020-12-29 | Payer: PRIVATE HEALTH INSURANCE

## 2020-12-29 DIAGNOSIS — Z1231 Encounter for screening mammogram for malignant neoplasm of breast: Principal | ICD-10-CM

## 2021-02-02 ENCOUNTER — Encounter
Admit: 2021-02-02 | Discharge: 2021-02-03 | Payer: PRIVATE HEALTH INSURANCE | Attending: Internal Medicine | Primary: Internal Medicine

## 2021-02-02 DIAGNOSIS — J069 Acute upper respiratory infection, unspecified: Principal | ICD-10-CM

## 2021-02-02 DIAGNOSIS — M0579 Rheumatoid arthritis with rheumatoid factor of multiple sites without organ or systems involvement: Principal | ICD-10-CM

## 2021-02-02 MED ORDER — CIPROFLOXACIN 0.3 %-DEXAMETHASONE 0.1 % EAR DROPS,SUSPENSION
Freq: Two times a day (BID) | OTIC | 0 refills | 38.00000 days | Status: CP
Start: 2021-02-02 — End: 2021-02-02

## 2021-02-02 MED ORDER — AMOXICILLIN 500 MG CAPSULE
ORAL_CAPSULE | Freq: Three times a day (TID) | ORAL | 0 refills | 10.00000 days | Status: CP
Start: 2021-02-02 — End: ?

## 2021-03-06 DIAGNOSIS — M19079 Primary osteoarthritis, unspecified ankle and foot: Principal | ICD-10-CM

## 2021-03-28 ENCOUNTER — Encounter
Admit: 2021-03-28 | Discharge: 2021-04-26 | Payer: PRIVATE HEALTH INSURANCE | Attending: Rehabilitative and Restorative Service Providers" | Primary: Rehabilitative and Restorative Service Providers"

## 2021-05-11 MED ORDER — PENICILLIN V POTASSIUM 500 MG TABLET
ORAL_TABLET | Freq: Four times a day (QID) | ORAL | 0 refills | 10 days | Status: CP
Start: 2021-05-11 — End: ?

## 2021-06-15 ENCOUNTER — Encounter
Admit: 2021-06-15 | Discharge: 2021-06-16 | Payer: PRIVATE HEALTH INSURANCE | Attending: Internal Medicine | Primary: Internal Medicine

## 2021-06-16 MED ORDER — PREDNISONE 5 MG TABLET
ORAL_TABLET | ORAL | 0 refills | 118 days | Status: CP
Start: 2021-06-16 — End: 2021-10-12

## 2021-06-16 MED ORDER — ALENDRONATE 70 MG TABLET
ORAL_TABLET | ORAL | 11 refills | 28.00000 days | Status: CP
Start: 2021-06-16 — End: 2022-06-16

## 2021-07-03 ENCOUNTER — Encounter: Admit: 2021-07-03 | Discharge: 2021-07-04 | Payer: PRIVATE HEALTH INSURANCE

## 2021-07-11 ENCOUNTER — Encounter: Admit: 2021-07-11 | Discharge: 2021-07-12 | Payer: PRIVATE HEALTH INSURANCE

## 2021-07-11 DIAGNOSIS — M0579 Rheumatoid arthritis with rheumatoid factor of multiple sites without organ or systems involvement: Principal | ICD-10-CM

## 2021-07-11 DIAGNOSIS — J449 Chronic obstructive pulmonary disease, unspecified: Principal | ICD-10-CM

## 2021-07-11 DIAGNOSIS — K219 Gastro-esophageal reflux disease without esophagitis: Principal | ICD-10-CM

## 2021-07-11 DIAGNOSIS — R490 Dysphonia: Principal | ICD-10-CM

## 2021-07-11 DIAGNOSIS — E119 Type 2 diabetes mellitus without complications: Principal | ICD-10-CM

## 2021-07-11 DIAGNOSIS — I10 Essential (primary) hypertension: Principal | ICD-10-CM

## 2021-07-11 MED ORDER — OMEPRAZOLE 20 MG TABLET,DELAYED RELEASE
ORAL_TABLET | Freq: Every day | ORAL | 0 refills | 90 days | Status: CP
Start: 2021-07-11 — End: 2022-07-11

## 2021-09-05 ENCOUNTER — Ambulatory Visit: Admit: 2021-09-05 | Payer: PRIVATE HEALTH INSURANCE | Attending: Audiologist | Primary: Audiologist

## 2021-09-05 ENCOUNTER — Ambulatory Visit
Admit: 2021-09-05 | Discharge: 2021-09-06 | Payer: PRIVATE HEALTH INSURANCE | Attending: Student in an Organized Health Care Education/Training Program | Primary: Student in an Organized Health Care Education/Training Program

## 2021-09-05 DIAGNOSIS — K219 Gastro-esophageal reflux disease without esophagitis: Principal | ICD-10-CM

## 2021-09-05 DIAGNOSIS — R490 Dysphonia: Principal | ICD-10-CM

## 2021-09-05 MED ORDER — FLUTICASONE PROPIONATE 50 MCG/ACTUATION NASAL SPRAY,SUSPENSION
Freq: Two times a day (BID) | NASAL | 1 refills | 0 days | Status: CP
Start: 2021-09-05 — End: 2022-09-05

## 2021-09-05 MED ORDER — FLUCONAZOLE 100 MG TABLET
ORAL_TABLET | 0 refills | 0 days | Status: CP
Start: 2021-09-05 — End: ?

## 2021-09-26 ENCOUNTER — Ambulatory Visit
Admit: 2021-09-26 | Discharge: 2021-09-27 | Payer: PRIVATE HEALTH INSURANCE | Attending: Student in an Organized Health Care Education/Training Program | Primary: Student in an Organized Health Care Education/Training Program

## 2021-09-26 MED ORDER — FLUCONAZOLE 100 MG TABLET
ORAL_TABLET | 0 refills | 0 days | Status: CP
Start: 2021-09-26 — End: ?

## 2021-09-27 MED ORDER — FLUTICASONE PROPIONATE 50 MCG/ACTUATION NASAL SPRAY,SUSPENSION
Freq: Two times a day (BID) | NASAL | 1 refills | 31 days | Status: CP
Start: 2021-09-27 — End: 2022-09-27

## 2021-10-01 DIAGNOSIS — K219 Gastro-esophageal reflux disease without esophagitis: Principal | ICD-10-CM

## 2021-10-03 MED ORDER — OMEPRAZOLE 20 MG CAPSULE,DELAYED RELEASE
ORAL_CAPSULE | Freq: Every morning | ORAL | 3 refills | 90.00000 days | Status: CP
Start: 2021-10-03 — End: 2022-09-28

## 2021-10-10 ENCOUNTER — Ambulatory Visit: Admit: 2021-10-10 | Discharge: 2021-10-11 | Payer: PRIVATE HEALTH INSURANCE

## 2021-10-10 DIAGNOSIS — E119 Type 2 diabetes mellitus without complications: Principal | ICD-10-CM

## 2021-10-10 DIAGNOSIS — M0579 Rheumatoid arthritis with rheumatoid factor of multiple sites without organ or systems involvement: Principal | ICD-10-CM

## 2021-10-10 DIAGNOSIS — K219 Gastro-esophageal reflux disease without esophagitis: Principal | ICD-10-CM

## 2021-10-10 DIAGNOSIS — M79671 Pain in right foot: Principal | ICD-10-CM

## 2021-10-10 DIAGNOSIS — I10 Essential (primary) hypertension: Principal | ICD-10-CM

## 2021-10-10 DIAGNOSIS — J449 Chronic obstructive pulmonary disease, unspecified: Principal | ICD-10-CM

## 2021-10-10 MED ORDER — HYDROCHLOROTHIAZIDE 25 MG TABLET
ORAL_TABLET | Freq: Every day | ORAL | 4 refills | 90.00000 days | Status: CP
Start: 2021-10-10 — End: 2022-10-16

## 2021-10-10 MED ORDER — DULOXETINE 30 MG CAPSULE,DELAYED RELEASE
ORAL_CAPSULE | Freq: Every day | ORAL | 3 refills | 90 days | Status: CP
Start: 2021-10-10 — End: 2022-10-10

## 2021-10-10 MED ORDER — OMEPRAZOLE 20 MG CAPSULE,DELAYED RELEASE
ORAL_CAPSULE | Freq: Every morning | ORAL | 4 refills | 90.00000 days | Status: CP
Start: 2021-10-10 — End: 2022-10-16

## 2021-10-10 MED ORDER — FLUTICASONE PROPIONATE 50 MCG/ACTUATION NASAL SPRAY,SUSPENSION
Freq: Two times a day (BID) | NASAL | 11 refills | 31.00000 days | Status: CP
Start: 2021-10-10 — End: 2023-10-17

## 2021-10-24 ENCOUNTER — Ambulatory Visit
Admit: 2021-10-24 | Discharge: 2021-10-25 | Payer: PRIVATE HEALTH INSURANCE | Attending: Student in an Organized Health Care Education/Training Program | Primary: Student in an Organized Health Care Education/Training Program

## 2021-10-30 ENCOUNTER — Ambulatory Visit: Admit: 2021-10-30 | Discharge: 2021-10-31 | Payer: PRIVATE HEALTH INSURANCE

## 2021-10-31 ENCOUNTER — Ambulatory Visit
Admit: 2021-10-31 | Discharge: 2021-11-03 | Payer: PRIVATE HEALTH INSURANCE | Attending: Rehabilitative and Restorative Service Providers" | Primary: Rehabilitative and Restorative Service Providers"

## 2021-11-06 ENCOUNTER — Ambulatory Visit
Admit: 2021-11-06 | Discharge: 2021-11-07 | Payer: PRIVATE HEALTH INSURANCE | Attending: Foot and Ankle Surgery | Primary: Foot and Ankle Surgery

## 2021-11-16 ENCOUNTER — Ambulatory Visit
Admit: 2021-11-16 | Discharge: 2021-11-17 | Payer: PRIVATE HEALTH INSURANCE | Attending: Internal Medicine | Primary: Internal Medicine

## 2021-11-16 DIAGNOSIS — M0579 Rheumatoid arthritis with rheumatoid factor of multiple sites without organ or systems involvement: Principal | ICD-10-CM

## 2021-11-16 MED ORDER — METHOTREXATE SODIUM 2.5 MG TABLET
ORAL_TABLET | ORAL | 3 refills | 84 days | Status: CP
Start: 2021-11-16 — End: ?

## 2021-11-16 MED ORDER — PREDNISONE 10 MG TABLET
ORAL_TABLET | ORAL | 0 refills | 8 days | Status: CP
Start: 2021-11-16 — End: 2021-11-24

## 2021-11-16 MED ORDER — FOLIC ACID 1 MG TABLET
ORAL_TABLET | Freq: Every day | ORAL | 3 refills | 90 days | Status: CP
Start: 2021-11-16 — End: 2022-11-16

## 2022-01-05 ENCOUNTER — Ambulatory Visit
Admit: 2022-01-05 | Payer: PRIVATE HEALTH INSURANCE | Attending: Student in an Organized Health Care Education/Training Program | Primary: Student in an Organized Health Care Education/Training Program

## 2022-01-05 MED ORDER — ALBUTEROL SULFATE HFA 90 MCG/ACTUATION AEROSOL INHALER
Freq: Four times a day (QID) | RESPIRATORY_TRACT | 11 refills | 0.00000 days | Status: CP | PRN
Start: 2022-01-05 — End: 2023-01-05

## 2022-01-05 MED ORDER — DOXYCYCLINE HYCLATE 100 MG TABLET
ORAL_TABLET | Freq: Two times a day (BID) | ORAL | 0 refills | 7.00000 days | Status: CP
Start: 2022-01-05 — End: 2022-01-12

## 2022-01-05 MED ORDER — PREDNISONE 20 MG TABLET
ORAL_TABLET | Freq: Every day | ORAL | 0 refills | 5.00000 days | Status: CP
Start: 2022-01-05 — End: ?

## 2022-01-20 ENCOUNTER — Ambulatory Visit: Admit: 2022-01-20 | Payer: PRIVATE HEALTH INSURANCE

## 2022-01-20 ENCOUNTER — Ambulatory Visit
Admit: 2022-01-20 | Discharge: 2022-02-07 | Disposition: A | Discharge status: Home Health | Payer: PRIVATE HEALTH INSURANCE

## 2022-01-20 ENCOUNTER — Encounter: Admit: 2022-01-20 | Payer: PRIVATE HEALTH INSURANCE

## 2022-01-29 MED ORDER — FLOVENT DISKUS 50 MCG/ACTUATION POWDER FOR INHALATION
Freq: Two times a day (BID) | RESPIRATORY_TRACT | 12 refills | 0 days | Status: CP
Start: 2022-01-29 — End: 2022-01-29

## 2022-01-29 MED ORDER — TRELEGY ELLIPTA 100 MCG-62.5 MCG-25 MCG POWDER FOR INHALATION
Freq: Every day | RESPIRATORY_TRACT | 0 refills | 60 days | Status: CP
Start: 2022-01-29 — End: 2022-01-29

## 2022-01-29 MED ORDER — ANORO ELLIPTA 62.5 MCG-25 MCG/ACTUATION POWDER FOR INHALATION
Freq: Every day | RESPIRATORY_TRACT | 0 refills | 30 days | Status: CP
Start: 2022-01-29 — End: 2022-01-29

## 2022-01-30 MED ORDER — FUROSEMIDE 40 MG TABLET
ORAL_TABLET | Freq: Every day | ORAL | 0 refills | 30 days | Status: CP
Start: 2022-01-30 — End: 2022-03-01

## 2022-01-30 MED ORDER — TRELEGY ELLIPTA 100 MCG-62.5 MCG-25 MCG POWDER FOR INHALATION
Freq: Every day | RESPIRATORY_TRACT | 0 refills | 60 days | Status: CP
Start: 2022-01-30 — End: 2022-01-30

## 2022-02-05 DIAGNOSIS — J449 Chronic obstructive pulmonary disease, unspecified: Principal | ICD-10-CM

## 2022-02-07 MED ORDER — IPRATROPIUM 0.5 MG-ALBUTEROL 3 MG (2.5 MG BASE)/3 ML NEBULIZATION SOLN
Freq: Four times a day (QID) | RESPIRATORY_TRACT | 0 refills | 30 days | Status: CP
Start: 2022-02-07 — End: 2022-03-09
  Filled 2022-02-07: qty 360, 30d supply, fill #0

## 2022-02-07 MED ORDER — TRELEGY ELLIPTA 100 MCG-62.5 MCG-25 MCG POWDER FOR INHALATION
Freq: Every day | RESPIRATORY_TRACT | 1 refills | 60 days | Status: CP
Start: 2022-02-07 — End: 2022-02-07

## 2022-02-08 DIAGNOSIS — Z09 Encounter for follow-up examination after completed treatment for conditions other than malignant neoplasm: Principal | ICD-10-CM

## 2022-02-08 MED ORDER — LEVOFLOXACIN 750 MG TABLET
ORAL_TABLET | Freq: Every day | ORAL | 0 refills | 1.00000 days | Status: CP
Start: 2022-02-08 — End: 2022-02-09
  Filled 2022-02-07: qty 1, 1d supply, fill #0

## 2022-02-14 ENCOUNTER — Ambulatory Visit: Admit: 2022-02-14 | Discharge: 2022-02-15 | Payer: PRIVATE HEALTH INSURANCE

## 2022-02-14 DIAGNOSIS — F17200 Nicotine dependence, unspecified, uncomplicated: Principal | ICD-10-CM

## 2022-02-14 DIAGNOSIS — E119 Type 2 diabetes mellitus without complications: Principal | ICD-10-CM

## 2022-02-14 DIAGNOSIS — J449 Chronic obstructive pulmonary disease, unspecified: Principal | ICD-10-CM

## 2022-02-14 DIAGNOSIS — M0579 Rheumatoid arthritis with rheumatoid factor of multiple sites without organ or systems involvement: Principal | ICD-10-CM

## 2022-02-14 DIAGNOSIS — Z09 Encounter for follow-up examination after completed treatment for conditions other than malignant neoplasm: Principal | ICD-10-CM

## 2022-02-14 MED ORDER — FLUTICASONE 250 MCG-SALMETEROL 50 MCG/DOSE BLISTR POWDR FOR INHALATION
Freq: Two times a day (BID) | RESPIRATORY_TRACT | 11 refills | 30 days | Status: CP
Start: 2022-02-14 — End: 2023-02-14

## 2022-02-21 ENCOUNTER — Ambulatory Visit: Admit: 2022-02-21 | Discharge: 2022-02-22 | Payer: PRIVATE HEALTH INSURANCE

## 2022-02-21 ENCOUNTER — Ambulatory Visit
Admit: 2022-02-21 | Discharge: 2022-02-22 | Payer: PRIVATE HEALTH INSURANCE | Attending: Student in an Organized Health Care Education/Training Program | Primary: Student in an Organized Health Care Education/Training Program

## 2022-02-21 DIAGNOSIS — M25562 Pain in left knee: Principal | ICD-10-CM

## 2022-02-21 MED ORDER — ACETAMINOPHEN 500 MG TABLET
ORAL_TABLET | Freq: Three times a day (TID) | ORAL | 0 refills | 10 days | Status: CP
Start: 2022-02-21 — End: 2022-03-03

## 2022-02-21 MED ORDER — MELOXICAM 15 MG TABLET
ORAL_TABLET | Freq: Every day | ORAL | 0 refills | 10 days | Status: CP
Start: 2022-02-21 — End: 2022-03-03

## 2022-02-26 ENCOUNTER — Ambulatory Visit: Admit: 2022-02-26 | Discharge: 2022-02-27 | Payer: PRIVATE HEALTH INSURANCE

## 2022-02-26 DIAGNOSIS — F17211 Nicotine dependence, cigarettes, in remission: Principal | ICD-10-CM

## 2022-02-26 DIAGNOSIS — I5082 Biventricular heart failure: Principal | ICD-10-CM

## 2022-02-26 DIAGNOSIS — J449 Chronic obstructive pulmonary disease, unspecified: Principal | ICD-10-CM

## 2022-02-26 DIAGNOSIS — I5032 Chronic diastolic (congestive) heart failure: Principal | ICD-10-CM

## 2022-02-26 DIAGNOSIS — M0579 Rheumatoid arthritis with rheumatoid factor of multiple sites without organ or systems involvement: Principal | ICD-10-CM

## 2022-02-26 MED ORDER — FUROSEMIDE 40 MG TABLET
ORAL_TABLET | Freq: Every day | ORAL | 11 refills | 30 days | Status: CP
Start: 2022-02-26 — End: 2022-03-05

## 2022-02-26 MED ORDER — FLUTICASONE PROPIONATE 230 MCG-SALMETEROL 21 MCG/ACTUATION HFA INHALER
Freq: Two times a day (BID) | RESPIRATORY_TRACT | 3 refills | 0 days | Status: CP
Start: 2022-02-26 — End: 2023-02-26
  Filled 2022-02-28: qty 12, 30d supply, fill #0

## 2022-02-26 MED ORDER — TIOTROPIUM BROMIDE 2.5 MCG/ACTUATION MIST FOR INHALATION
Freq: Every day | RESPIRATORY_TRACT | 0 refills | 0 days | Status: CP
Start: 2022-02-26 — End: ?
  Filled 2022-02-28: qty 4, 30d supply, fill #0

## 2022-02-26 MED ORDER — NICOTINE 14 MG/24 HR DAILY TRANSDERMAL PATCH
MEDICATED_PATCH | TRANSDERMAL | 0 refills | 28 days | Status: CP
Start: 2022-02-26 — End: ?
  Filled 2022-02-28: qty 28, 28d supply, fill #0

## 2022-02-27 ENCOUNTER — Ambulatory Visit
Admit: 2022-02-27 | Discharge: 2022-02-28 | Payer: PRIVATE HEALTH INSURANCE | Attending: Internal Medicine | Primary: Internal Medicine

## 2022-02-27 DIAGNOSIS — J449 Chronic obstructive pulmonary disease, unspecified: Principal | ICD-10-CM

## 2022-02-27 DIAGNOSIS — M0579 Rheumatoid arthritis with rheumatoid factor of multiple sites without organ or systems involvement: Principal | ICD-10-CM

## 2022-02-27 MED ORDER — PREDNISONE 5 MG TABLET
ORAL_TABLET | ORAL | 0 refills | 21 days | Status: CP
Start: 2022-02-27 — End: 2022-03-20

## 2022-02-27 MED ORDER — METHOTREXATE SODIUM 2.5 MG TABLET
ORAL_TABLET | ORAL | 3 refills | 84 days | Status: CP
Start: 2022-02-27 — End: ?

## 2022-02-28 DIAGNOSIS — R1114 Bilious vomiting: Principal | ICD-10-CM

## 2022-02-28 MED ORDER — ONDANSETRON 4 MG DISINTEGRATING TABLET
ORAL_TABLET | Freq: Three times a day (TID) | ORAL | 0 refills | 7 days | Status: CP | PRN
Start: 2022-02-28 — End: 2022-03-07

## 2022-03-14 ENCOUNTER — Ambulatory Visit: Admit: 2022-03-14 | Discharge: 2022-03-15 | Payer: PRIVATE HEALTH INSURANCE

## 2022-03-14 DIAGNOSIS — M0579 Rheumatoid arthritis with rheumatoid factor of multiple sites without organ or systems involvement: Principal | ICD-10-CM

## 2022-03-14 DIAGNOSIS — I10 Essential (primary) hypertension: Principal | ICD-10-CM

## 2022-03-14 DIAGNOSIS — J449 Chronic obstructive pulmonary disease, unspecified: Principal | ICD-10-CM

## 2022-03-14 DIAGNOSIS — R0602 Shortness of breath: Principal | ICD-10-CM

## 2022-03-14 MED ORDER — PREDNISONE 5 MG TABLET
ORAL_TABLET | ORAL | 0 refills | 21 days | Status: CP
Start: 2022-03-14 — End: 2022-04-04

## 2022-03-16 MED ORDER — MAGNESIUM OXIDE 400 MG (241.3 MG MAGNESIUM) TABLET
ORAL_TABLET | Freq: Every day | ORAL | 0 refills | 30 days | Status: CP
Start: 2022-03-16 — End: 2023-03-16

## 2022-03-21 DIAGNOSIS — J449 Chronic obstructive pulmonary disease, unspecified: Principal | ICD-10-CM

## 2022-03-21 MED ORDER — FLUTICASONE PROPIONATE 230 MCG-SALMETEROL 21 MCG/ACTUATION HFA INHALER
Freq: Two times a day (BID) | RESPIRATORY_TRACT | 11 refills | 120 days | Status: CP
Start: 2022-03-21 — End: 2023-03-21
  Filled 2022-03-29: qty 12, 30d supply, fill #0

## 2022-03-21 MED ORDER — TIOTROPIUM BROMIDE 2.5 MCG/ACTUATION MIST FOR INHALATION
Freq: Every day | RESPIRATORY_TRACT | 11 refills | 120 days | Status: CP
Start: 2022-03-21 — End: ?
  Filled 2022-03-29: qty 4, 30d supply, fill #0

## 2022-03-21 MED ORDER — POTASSIUM CHLORIDE ER 20 MEQ TABLET,EXTENDED RELEASE(PART/CRYST)
ORAL_TABLET | Freq: Every day | ORAL | 11 refills | 30 days | Status: CP
Start: 2022-03-21 — End: 2023-03-21
  Filled 2022-03-29: qty 30, 30d supply, fill #0

## 2022-03-27 DIAGNOSIS — Z1231 Encounter for screening mammogram for malignant neoplasm of breast: Principal | ICD-10-CM

## 2022-04-04 ENCOUNTER — Ambulatory Visit: Admit: 2022-04-04 | Discharge: 2022-04-04 | Payer: PRIVATE HEALTH INSURANCE

## 2022-04-04 ENCOUNTER — Ambulatory Visit
Admit: 2022-04-04 | Discharge: 2022-04-04 | Payer: PRIVATE HEALTH INSURANCE | Attending: Internal Medicine | Primary: Internal Medicine

## 2022-04-04 DIAGNOSIS — M25522 Pain in left elbow: Principal | ICD-10-CM

## 2022-04-04 DIAGNOSIS — M0579 Rheumatoid arthritis with rheumatoid factor of multiple sites without organ or systems involvement: Principal | ICD-10-CM

## 2022-04-04 MED ORDER — SULFASALAZINE 500 MG TABLET,DELAYED RELEASE
ORAL_TABLET | Freq: Two times a day (BID) | ORAL | 3 refills | 90 days | Status: CP
Start: 2022-04-04 — End: 2023-04-04

## 2022-04-04 MED ORDER — HYDROXYCHLOROQUINE 200 MG TABLET
ORAL_TABLET | Freq: Two times a day (BID) | ORAL | 3 refills | 90 days | Status: CP
Start: 2022-04-04 — End: 2023-04-04

## 2022-04-04 MED ORDER — PREDNISONE 5 MG TABLET
ORAL_TABLET | Freq: Every day | ORAL | 1 refills | 90 days | Status: CP
Start: 2022-04-04 — End: ?

## 2022-04-06 ENCOUNTER — Ambulatory Visit: Admit: 2022-04-06 | Discharge: 2022-04-07 | Payer: PRIVATE HEALTH INSURANCE

## 2022-04-11 ENCOUNTER — Ambulatory Visit: Admit: 2022-04-11 | Discharge: 2022-04-11 | Payer: PRIVATE HEALTH INSURANCE

## 2022-04-11 DIAGNOSIS — I5082 Biventricular heart failure: Principal | ICD-10-CM

## 2022-04-11 DIAGNOSIS — I5032 Chronic diastolic (congestive) heart failure: Principal | ICD-10-CM

## 2022-04-16 MED ORDER — MAGNESIUM OXIDE 400 MG (241.3 MG MAGNESIUM) TABLET
ORAL_TABLET | Freq: Every day | ORAL | 11 refills | 30 days | Status: CP
Start: 2022-04-16 — End: 2023-04-16
  Filled 2022-04-19: qty 30, 30d supply, fill #0

## 2022-04-24 MED FILL — POTASSIUM CHLORIDE ER 20 MEQ TABLET,EXTENDED RELEASE(PART/CRYST): ORAL | 30 days supply | Qty: 30 | Fill #1

## 2022-04-25 MED ORDER — CYCLOBENZAPRINE 5 MG TABLET
ORAL_TABLET | Freq: Every day | ORAL | 1 refills | 20 days | Status: CP | PRN
Start: 2022-04-25 — End: ?
  Filled 2022-04-30: qty 20, 20d supply, fill #0

## 2022-04-26 ENCOUNTER — Ambulatory Visit: Admit: 2022-04-26 | Discharge: 2022-04-27 | Payer: PRIVATE HEALTH INSURANCE

## 2022-05-07 ENCOUNTER — Telehealth: Admit: 2022-05-07 | Discharge: 2022-05-08 | Payer: PRIVATE HEALTH INSURANCE

## 2022-05-07 DIAGNOSIS — R059 Cough, unspecified type: Principal | ICD-10-CM

## 2022-05-08 DIAGNOSIS — J449 Chronic obstructive pulmonary disease, unspecified: Principal | ICD-10-CM

## 2022-05-08 MED ORDER — TIOTROPIUM BROMIDE 2.5 MCG/ACTUATION MIST FOR INHALATION
Freq: Every day | RESPIRATORY_TRACT | 11 refills | 120 days | Status: CP
Start: 2022-05-08 — End: ?

## 2022-05-11 DIAGNOSIS — J449 Chronic obstructive pulmonary disease, unspecified: Principal | ICD-10-CM

## 2022-05-11 MED ORDER — TIOTROPIUM BROMIDE 2.5 MCG/ACTUATION MIST FOR INHALATION
Freq: Every day | RESPIRATORY_TRACT | 11 refills | 120 days | Status: CP
Start: 2022-05-11 — End: ?

## 2022-05-15 MED FILL — CYCLOBENZAPRINE 5 MG TABLET: ORAL | 20 days supply | Qty: 20 | Fill #1

## 2022-05-17 DIAGNOSIS — J449 Chronic obstructive pulmonary disease, unspecified: Principal | ICD-10-CM

## 2022-05-18 DIAGNOSIS — J449 Chronic obstructive pulmonary disease, unspecified: Principal | ICD-10-CM

## 2022-05-18 MED ORDER — TIOTROPIUM BROMIDE 2.5 MCG/ACTUATION MIST FOR INHALATION
Freq: Every day | RESPIRATORY_TRACT | 11 refills | 120 days | Status: CP
Start: 2022-05-18 — End: ?

## 2022-05-21 MED FILL — MAGNESIUM OXIDE 400 MG (241.3 MG MAGNESIUM) TABLET: ORAL | 30 days supply | Qty: 30 | Fill #1

## 2022-05-22 ENCOUNTER — Ambulatory Visit: Admit: 2022-05-22 | Discharge: 2022-05-23 | Payer: PRIVATE HEALTH INSURANCE

## 2022-05-22 DIAGNOSIS — I5032 Chronic diastolic (congestive) heart failure: Principal | ICD-10-CM

## 2022-05-22 DIAGNOSIS — M0579 Rheumatoid arthritis with rheumatoid factor of multiple sites without organ or systems involvement: Principal | ICD-10-CM

## 2022-05-22 DIAGNOSIS — E119 Type 2 diabetes mellitus without complications: Principal | ICD-10-CM

## 2022-05-22 DIAGNOSIS — J449 Chronic obstructive pulmonary disease, unspecified: Principal | ICD-10-CM

## 2022-05-22 DIAGNOSIS — I10 Essential (primary) hypertension: Principal | ICD-10-CM

## 2022-05-22 DIAGNOSIS — I5082 Biventricular heart failure: Principal | ICD-10-CM

## 2022-05-22 MED ORDER — NYSTATIN 100,000 UNIT/ML ORAL SUSPENSION
Freq: Four times a day (QID) | ORAL | 0 refills | 24 days | Status: CP
Start: 2022-05-22 — End: ?

## 2022-05-22 MED ORDER — CYCLOBENZAPRINE 5 MG TABLET
ORAL_TABLET | Freq: Three times a day (TID) | ORAL | 5 refills | 20 days | Status: CP | PRN
Start: 2022-05-22 — End: ?

## 2022-05-24 ENCOUNTER — Encounter: Payer: BC Managed Care – PPO | Attending: Internal Medicine

## 2022-05-24 ENCOUNTER — Other Ambulatory Visit: Payer: Self-pay

## 2022-05-24 DIAGNOSIS — J449 Chronic obstructive pulmonary disease, unspecified: Secondary | ICD-10-CM | POA: Insufficient documentation

## 2022-05-24 NOTE — Progress Notes (Signed)
Virtual Visit completed. Patient informed on EP and RD appointment and 6 Minute walk test. Patient also informed of patient health questionnaires on My Chart. Patient Verbalizes understanding. Visit diagnosis can be found in [facility] 06/11/2022.

## 2022-05-28 MED FILL — POTASSIUM CHLORIDE ER 20 MEQ TABLET,EXTENDED RELEASE(PART/CRYST): ORAL | 30 days supply | Qty: 30 | Fill #2

## 2022-06-11 ENCOUNTER — Encounter: Payer: BC Managed Care – PPO | Admitting: *Deleted

## 2022-06-11 VITALS — Ht 66.5 in | Wt 245.4 lb

## 2022-06-11 DIAGNOSIS — J449 Chronic obstructive pulmonary disease, unspecified: Secondary | ICD-10-CM | POA: Diagnosis present

## 2022-06-11 NOTE — Progress Notes (Signed)
Pulmonary Individual Treatment Plan  Patient Details  Name: Kelly Trujillo MRN: [id number] Date of Birth: Mar 24, 1961 Referring Provider:   Flowsheet Row Pulmonary Rehab from 06/11/2022 in [facility] Cardiac and Pulmonary Rehab  Referring Provider Tai Wang       Initial Encounter Date:  Flowsheet Row Pulmonary Rehab from 06/11/2022 in [facility] Cardiac and Pulmonary Rehab  Date 06/11/22       Visit Diagnosis: Chronic obstructive pulmonary disease, unspecified COPD type (HCC)  Patient's Home Medications on Admission:  Current Outpatient Medications:    acetaminophen (TYLENOL) 325 MG tablet, Take by mouth., Disp: , Rfl:    acetaminophen (TYLENOL) 325 MG tablet, Take by mouth. (Patient not taking: Reported on 05/24/2022), Disp: , Rfl:    ADVAIR DISKUS 500-50 MCG/DOSE AEPB, , Disp: , Rfl:    albuterol (VENTOLIN HFA) 108 (90 Base) MCG/ACT inhaler, Inhale into the lungs., Disp: , Rfl:    B Complex Vitamins (B-COMPLEX/B-12 PO), Take 1 tablet by mouth every morning. (Patient not taking: Reported on 05/24/2022), Disp: , Rfl:    Calcium Carbonate-Vit D-Min (CALCIUM 1200 PO), Take 1 tablet by mouth every morning. (Patient not taking: Reported on 05/24/2022), Disp: , Rfl:    Cholecalciferol (VITAMIN D-1000 MAX ST) 25 MCG (1000 UT) tablet, Take by mouth. (Patient not taking: Reported on 05/24/2022), Disp: , Rfl:    Cholecalciferol (VITAMIN D-3) 1000 units CAPS, Take 2 capsules by mouth every morning., Disp: , Rfl:    ciprofloxacin (CIPRO) 500 MG tablet, Take 1 tablet (500 mg total) by mouth every 12 (twelve) hours. (Patient not taking: Reported on 05/24/2022), Disp: 14 tablet, Rfl: 0   cyclobenzaprine (FLEXERIL) 5 MG tablet, Take by mouth., Disp: , Rfl:    diclofenac sodium (VOLTAREN) 1 % GEL, Apply 2 g topically 4 (four) times daily., Disp: , Rfl:    fluticasone-salmeterol (ADVAIR HFA) 230-21 MCG/ACT inhaler, Inhale into the lungs., Disp: , Rfl:    folic acid (FOLVITE) 1 MG tablet, Take 1 mg by mouth daily., Disp: ,  Rfl:    folic acid (FOLVITE) 1 MG tablet, Take 1 tablet by mouth daily. (Patient not taking: Reported on 05/24/2022), Disp: , Rfl:    furosemide (LASIX) 40 MG tablet, Take by mouth., Disp: , Rfl:    hydrochlorothiazide (HYDRODIURIL) 25 MG tablet, Take 25 mg by mouth daily., Disp: , Rfl: 5   hydroxychloroquine (PLAQUENIL) 200 MG tablet, Take by mouth., Disp: , Rfl:    ibuprofen (ADVIL,MOTRIN) 800 MG tablet, Take 800 mg by mouth every 8 (eight) hours as needed. (Patient not taking: Reported on 05/24/2022), Disp: , Rfl:    magnesium oxide (MAG-OX) 400 MG tablet, Take by mouth., Disp: , Rfl:    meloxicam (MOBIC) 7.5 MG tablet, Take 1 tablet (7.5 mg total) by mouth daily. (Patient not taking: Reported on 05/24/2022), Disp: 30 tablet, Rfl: 0   methotrexate (RHEUMATREX) 2.5 MG tablet, Take by mouth., Disp: , Rfl:    Misc Natural Products (GREEN TEA) TABS, Take by mouth daily. (Patient not taking: Reported on 05/24/2022), Disp: , Rfl:    Multiple Vitamin (MULTI-VITAMINS) TABS, Take by mouth. (Patient not taking: Reported on 05/24/2022), Disp: , Rfl:    Multiple Vitamin (MULTI-VITAMINS) TABS, Take 1 tablet by mouth daily. (Patient not taking: Reported on 05/24/2022), Disp: , Rfl:    Multiple Vitamins-Minerals (MULTIVITAMIN WITH MINERALS) tablet, Take 1 tablet by mouth daily. (Patient not taking: Reported on 05/24/2022), Disp: , Rfl:    nystatin (MYCOSTATIN) 100000 UNIT/ML suspension, Take by mouth., Disp: , Rfl:  omeprazole (PRILOSEC) 20 MG capsule, Take 1 capsule by mouth every morning., Disp: , Rfl:    potassium chloride SA (KLOR-CON M) 20 MEQ tablet, Take 1 tablet by mouth daily., Disp: , Rfl:    predniSONE (DELTASONE) 5 MG tablet, Take by mouth., Disp: , Rfl:    sulfaSALAzine (AZULFIDINE) 500 MG EC tablet, Take by mouth., Disp: , Rfl:    Tiotropium Bromide Monohydrate 2.5 MCG/ACT AERS, Inhale into the lungs., Disp: , Rfl:    Turmeric 500 MG CAPS, Take by mouth daily., Disp: , Rfl:    varenicline (CHANTIX) 0.5 MG  tablet, Take 0.5mg  (1 tablet) daily days 1-3.  Increase to 0.5mg  twice daily days 4-7.  Increase to 1mg  twice daily on day 8. (Patient not taking: Reported on 05/24/2022), Disp: , Rfl:    VITAMIN E PO, Take 180 mg by mouth daily. (Patient not taking: Reported on 05/24/2022), Disp: , Rfl:   Past Medical History: Past Medical History:  Diagnosis Date   COPD (chronic obstructive pulmonary disease) (HCC)    Hypertension     Tobacco Use: Social History   Tobacco Use  Smoking Status Former   Packs/day: 1.00   Years: 35.00   Total pack years: 35.00   Types: Cigarettes   Quit date: 01/19/2022   Years since quitting: 0.3  Smokeless Tobacco Never    Labs: Review Flowsheet        No data to display           Pulmonary Assessment Scores:  Pulmonary Assessment Scores     Row Name 06/11/22 1547         ADL UCSD   SOB Score total 22     Rest 0     Walk 0     Stairs 1     Bath 2     Dress 2     Shop 2       CAT Score   CAT Score 9       mMRC Score   mMRC Score 0              UCSD: Self-administered rating of dyspnea associated with activities of daily living (ADLs) 6-point scale (0 = "not at all" to 5 = "maximal or unable to do because of breathlessness")  Scoring Scores range from 0 to 120.  Minimally important difference is 5 units  CAT: CAT can identify the health impairment of COPD patients and is better correlated with disease progression.  CAT has a scoring range of zero to 40. The CAT score is classified into four groups of low (less than 10), medium (10 - 20), high (21-30) and very high (31-40) based on the impact level of disease on health status. A CAT score over 10 suggests significant symptoms.  A worsening CAT score could be explained by an exacerbation, poor medication adherence, poor inhaler technique, or progression of COPD or comorbid conditions.  CAT MCID is 2 points  mMRC: mMRC (Modified Medical Research Council) Dyspnea Scale is used to assess  the degree of baseline functional disability in patients of respiratory disease due to dyspnea. No minimal important difference is established. A decrease in score of 1 point or greater is considered a positive change.   Pulmonary Function Assessment:  Pulmonary Function Assessment - 05/24/22 0913       Breath   Shortness of Breath Yes;Limiting activity             Exercise Target Goals: Exercise Program Goal: Individual exercise  prescription set using results from initial 6 min walk test and THRR while considering  patient's activity barriers and safety.   Exercise Prescription Goal: Initial exercise prescription builds to 30-45 minutes a day of aerobic activity, 2-3 days per week.  Home exercise guidelines will be given to patient during program as part of exercise prescription that the participant will acknowledge.  Education: Aerobic Exercise: - Group verbal and visual presentation on the components of exercise prescription. Introduces F.I.T.T principle from ACSM for exercise prescriptions.  Reviews F.I.T.T. principles of aerobic exercise including progression. Written material given at graduation.   Education: Resistance Exercise: - Group verbal and visual presentation on the components of exercise prescription. Introduces F.I.T.T principle from ACSM for exercise prescriptions  Reviews F.I.T.T. principles of resistance exercise including progression. Written material given at graduation.    Education: Exercise & Equipment Safety: - Individual verbal instruction and demonstration of equipment use and safety with use of the equipment. Flowsheet Row Pulmonary Rehab from 06/11/2022 in [facility] Cardiac and Pulmonary Rehab  Date 06/11/22  Educator [facility]  Instruction Review Code 1- Verbalizes Understanding       Education: Exercise Physiology & General Exercise Guidelines: - Group verbal and written instruction with models to review the exercise physiology of the cardiovascular system  and associated critical values. Provides general exercise guidelines with specific guidelines to those with heart or lung disease.    Education: Flexibility, Balance, Mind/Body Relaxation: - Group verbal and visual presentation with interactive activity on the components of exercise prescription. Introduces F.I.T.T principle from ACSM for exercise prescriptions. Reviews F.I.T.T. principles of flexibility and balance exercise training including progression. Also discusses the mind body connection.  Reviews various relaxation techniques to help reduce and manage stress (i.e. Deep breathing, progressive muscle relaxation, and visualization). Balance handout provided to take home. Written material given at graduation.   Activity Barriers & Risk Stratification:   6 Minute Walk:  6 Minute Walk     Row Name 06/11/22 1530         6 Minute Walk   Phase Initial     Distance 665 feet     Walk Time 6 minutes     # of Rest Breaks 0     MPH 1.23     METS 1.81     RPE 11     Perceived Dyspnea  1     VO2 Peak 6.33     Symptoms No     Resting HR 83 bpm     Resting BP 124/80     Resting Oxygen Saturation  93 %     Exercise Oxygen Saturation  during 6 min walk 86 %     Max Ex. HR 119 bpm     Max Ex. BP 138/88     2 Minute Post BP 124/80       Interval HR   1 Minute HR 90     2 Minute HR 111     3 Minute HR 114     4 Minute HR 111     5 Minute HR 119     6 Minute HR 118     Interval Heart Rate? Yes       Interval Oxygen   Interval Oxygen? Yes     Baseline Oxygen Saturation % 93 %     1 Minute Oxygen Saturation % 90 %     1 Minute Liters of Oxygen 2 L     2 Minute Oxygen Saturation % 87 %  2 Minute Liters of Oxygen 2 L     3 Minute Oxygen Saturation % 87 %     3 Minute Liters of Oxygen 2 L     4 Minute Oxygen Saturation % 89 %     4 Minute Liters of Oxygen 2 L     5 Minute Oxygen Saturation % 86 %     5 Minute Liters of Oxygen 2 L     6 Minute Oxygen Saturation % 86 %     6  Minute Liters of Oxygen 2 L     2 Minute Post Oxygen Saturation % 94 %     2 Minute Post Liters of Oxygen 2 L             Oxygen Initial Assessment:  Oxygen Initial Assessment - 06/11/22 1546       Home Oxygen   Home Oxygen Device Home Concentrator;Portable Concentrator;E-Tanks    Sleep Oxygen Prescription BiPAP    Liters per minute 2    Home Exercise Oxygen Prescription Continuous    Liters per minute 2    Home Resting Oxygen Prescription Continuous    Liters per minute 2    Compliance with Home Oxygen Use Yes      Initial 6 min Walk   Oxygen Used Continuous    Liters per minute 2      Program Oxygen Prescription   Program Oxygen Prescription Continuous    Liters per minute 2      Intervention   Short Term Goals To learn and exhibit compliance with exercise, home and travel O2 prescription;To learn and understand importance of monitoring SPO2 with pulse oximeter and demonstrate accurate use of the pulse oximeter.;To learn and understand importance of maintaining oxygen saturations>88%;To learn and demonstrate proper pursed lip breathing techniques or other breathing techniques. ;To learn and demonstrate proper use of respiratory medications    Long  Term Goals Exhibits compliance with exercise, home  and travel O2 prescription;Verbalizes importance of monitoring SPO2 with pulse oximeter and return demonstration;Maintenance of O2 saturations>88%;Exhibits proper breathing techniques, such as pursed lip breathing or other method taught during program session;Compliance with respiratory medication;Demonstrates proper use of MDI's             Oxygen Re-Evaluation:   Oxygen Discharge (Final Oxygen Re-Evaluation):   Initial Exercise Prescription:  Initial Exercise Prescription - 06/11/22 1500       Date of Initial Exercise RX and Referring Provider   Date 06/11/22    Referring Provider Tai Regino Schultze      Oxygen   Oxygen Continuous    Liters 2    Maintain Oxygen  Saturation 88% or higher      Recumbant Bike   Level 1    RPM 50    Minutes 15    METs 1.81      NuStep   Level 1    SPM 80    Minutes 15    METs 1.81      Arm Ergometer   Level 1    RPM 30    Minutes 15    METs 1.81      Biostep-RELP   Level 1    SPM 50    Minutes 15    METs 1.81      Track   Laps 15    Minutes 15    METs 1.82      Prescription Details   Frequency (times per week) 3    Duration Progress to  30 minutes of continuous aerobic without signs/symptoms of physical distress      Intensity   THRR 40-80% of Max Heartrate 113-143    Ratings of Perceived Exertion 11-13    Perceived Dyspnea 0-4      Progression   Progression Continue to progress workloads to maintain intensity without signs/symptoms of physical distress.      Resistance Training   Training Prescription Yes    Weight 2    Reps 10-15             Perform Capillary Blood Glucose checks as needed.  Exercise Prescription Changes:   Exercise Prescription Changes     Row Name 06/11/22 1500             Response to Exercise   Blood Pressure (Admit) 124/80       Blood Pressure (Exercise) 138/88       Blood Pressure (Exit) 124/80       Heart Rate (Admit) 83 bpm       Heart Rate (Exercise) 119 bpm       Heart Rate (Exit) 90 bpm       Oxygen Saturation (Admit) 93 %       Oxygen Saturation (Exercise) 86 %       Oxygen Saturation (Exit) 94 %       Rating of Perceived Exertion (Exercise) 11       Perceived Dyspnea (Exercise) 1       Comments 6 MWT results                Exercise Comments:   Exercise Goals and Review:   Exercise Goals     Row Name 06/11/22 1540             Exercise Goals   Increase Physical Activity Yes       Intervention Provide advice, education, support and counseling about physical activity/exercise needs.;Develop an individualized exercise prescription for aerobic and resistive training based on initial evaluation findings, risk  stratification, comorbidities and participant's personal goals.       Expected Outcomes Short Term: Attend rehab on a regular basis to increase amount of physical activity.;Long Term: Add in home exercise to make exercise part of routine and to increase amount of physical activity.;Long Term: Exercising regularly at least 3-5 days a week.       Increase Strength and Stamina Yes       Intervention Provide advice, education, support and counseling about physical activity/exercise needs.;Develop an individualized exercise prescription for aerobic and resistive training based on initial evaluation findings, risk stratification, comorbidities and participant's personal goals.       Expected Outcomes Short Term: Increase workloads from initial exercise prescription for resistance, speed, and METs.;Short Term: Perform resistance training exercises routinely during rehab and add in resistance training at home;Long Term: Improve cardiorespiratory fitness, muscular endurance and strength as measured by increased METs and functional capacity ( )       Able to understand and use rate of perceived exertion (RPE) scale Yes       Intervention Provide education and explanation on how to use RPE scale       Expected Outcomes Short Term: Able to use RPE daily in rehab to express subjective intensity level;Long Term:  Able to use RPE to guide intensity level when exercising independently       Able to understand and use Dyspnea scale Yes       Intervention Provide education and explanation on how to use  Dyspnea scale       Expected Outcomes Short Term: Able to use Dyspnea scale daily in rehab to express subjective sense of shortness of breath during exertion;Long Term: Able to use Dyspnea scale to guide intensity level when exercising independently       Knowledge and understanding of Target Heart Rate Range (THRR) Yes       Intervention Provide education and explanation of THRR including how the numbers were predicted  and where they are located for reference       Expected Outcomes Short Term: Able to state/look up THRR;Short Term: Able to use daily as guideline for intensity in rehab;Long Term: Able to use THRR to govern intensity when exercising independently       Able to check pulse independently Yes       Intervention Provide education and demonstration on how to check pulse in carotid and radial arteries.;Review the importance of being able to check your own pulse for safety during independent exercise       Expected Outcomes Short Term: Able to explain why pulse checking is important during independent exercise;Long Term: Able to check pulse independently and accurately       Understanding of Exercise Prescription Yes       Intervention Provide education, explanation, and written materials on patient's individual exercise prescription       Expected Outcomes Short Term: Able to explain program exercise prescription;Long Term: Able to explain home exercise prescription to exercise independently                Exercise Goals Re-Evaluation :   Discharge Exercise Prescription (Final Exercise Prescription Changes):  Exercise Prescription Changes - 06/11/22 1500       Response to Exercise   Blood Pressure (Admit) 124/80    Blood Pressure (Exercise) 138/88    Blood Pressure (Exit) 124/80    Heart Rate (Admit) 83 bpm    Heart Rate (Exercise) 119 bpm    Heart Rate (Exit) 90 bpm    Oxygen Saturation (Admit) 93 %    Oxygen Saturation (Exercise) 86 %    Oxygen Saturation (Exit) 94 %    Rating of Perceived Exertion (Exercise) 11    Perceived Dyspnea (Exercise) 1    Comments 6 MWT results             Nutrition:  Target Goals: Understanding of nutrition guidelines, daily intake of sodium 1500mg , cholesterol 200mg , calories 30% from fat and 7% or less from saturated fats, daily to have 5 or more servings of fruits and vegetables.  Education: All About Nutrition: -Group instruction provided  by verbal, written material, interactive activities, discussions, models, and posters to present general guidelines for heart healthy nutrition including fat, fiber, MyPlate, the role of sodium in heart healthy nutrition, utilization of the nutrition label, and utilization of this knowledge for meal planning. Follow up email sent as well. Written material given at graduation.   Biometrics:  Pre Biometrics - 06/11/22 1541       Pre Biometrics   Height 5' 6.5" (1.689 m)    Weight 245 lb 6.4 oz (111.3 kg)    BMI (Calculated) 39.02    Single Leg Stand 2.2 seconds              Nutrition Therapy Plan and Nutrition Goals:  Nutrition Therapy & Goals - 06/11/22 1542       Intervention Plan   Intervention Prescribe, educate and counsel regarding individualized specific dietary modifications aiming towards targeted  core components such as weight, hypertension, lipid management, diabetes, heart failure and other comorbidities.    Expected Outcomes Short Term Goal: Understand basic principles of dietary content, such as calories, fat, sodium, cholesterol and nutrients.;Short Term Goal: A plan has been developed with personal nutrition goals set during dietitian appointment.;Long Term Goal: Adherence to prescribed nutrition plan.             Nutrition Assessments:  MEDIFICTS Score Key: ?70 Need to make dietary changes  40-70 Heart Healthy Diet ? 40 Therapeutic Level Cholesterol Diet  Flowsheet Row Pulmonary Rehab from 06/11/2022 in [facility] Cardiac and Pulmonary Rehab  Picture Your Plate Total Score on Admission 56      Picture Your Plate Scores: <44 Unhealthy dietary pattern with much room for improvement. 41-50 Dietary pattern unlikely to meet recommendations for good health and room for improvement. 51-60 More healthful dietary pattern, with some room for improvement.  >60 Healthy dietary pattern, although there may be some specific behaviors that could be improved.   Nutrition  Goals Re-Evaluation:   Nutrition Goals Discharge (Final Nutrition Goals Re-Evaluation):   Psychosocial: Target Goals: Acknowledge presence or absence of significant depression and/or stress, maximize coping skills, provide positive support system. Participant is able to verbalize types and ability to use techniques and skills needed for reducing stress and depression.   Education: Stress, Anxiety, and Depression - Group verbal and visual presentation to define topics covered.  Reviews how body is impacted by stress, anxiety, and depression.  Also discusses healthy ways to reduce stress and to treat/manage anxiety and depression.  Written material given at graduation.   Education: Sleep Hygiene -Provides group verbal and written instruction about how sleep can affect your health.  Define sleep hygiene, discuss sleep cycles and impact of sleep habits. Review good sleep hygiene tips.    Initial Review & Psychosocial Screening:  Initial Psych Review & Screening - 05/24/22 0921       Initial Review   Current issues with Current Stress Concerns    Source of Stress Concerns Chronic Illness;Unable to participate in former interests or hobbies;Occupation    Comments She has been out of work for 4 months and has been frustrating. She has been having trouble sleepingand wants to gether health back to normal. She was working at [employer] in [place]. Her health and being on oxygen is one of of her big stress concerns. She is in pain alot and has to use a walker.      Family Dynamics   Good Support System? Yes      Barriers   Psychosocial barriers to participate in program The patient should benefit from training in stress management and relaxation.      Screening Interventions   Interventions Encouraged to exercise;To provide support and resources with identified psychosocial needs;Provide feedback about the scores to participant    Expected Outcomes Short Term goal: Utilizing psychosocial counselor,  staff and physician to assist with identification of specific Stressors or current issues interfering with healing process. Setting desired goal for each stressor or current issue identified.;Long Term Goal: Stressors or current issues are controlled or eliminated.;Short Term goal: Identification and review with participant of any Quality of Life or Depression concerns found by scoring the questionnaire.;Long Term goal: The participant improves quality of Life and PHQ9 Scores as seen by post scores and/or verbalization of changes             Quality of Life Scores:  Scores of 19 and below usually indicate  a poorer quality of life in these areas.  A difference of  2-3 points is a clinically meaningful difference.  A difference of 2-3 points in the total score of the Quality of Life Index has been associated with significant improvement in overall quality of life, self-image, physical symptoms, and general health in studies assessing change in quality of life.  PHQ-9: Review Flowsheet       06/11/2022  Depression screen PHQ 2/9  Decreased Interest 0  Down, Depressed, Hopeless 1  PHQ - 2 Score 1  Altered sleeping 2  Tired, decreased energy 1  Change in appetite 1  Feeling bad or failure about yourself  0  Trouble concentrating 1  Moving slowly or fidgety/restless 1  Suicidal thoughts 0  PHQ-9 Score 7   Interpretation of Total Score  Total Score Depression Severity:  1-4 = Minimal depression, 5-9 = Mild depression, 10-14 = Moderate depression, 15-19 = Moderately severe depression, 20-27 = Severe depression   Psychosocial Evaluation and Intervention:  Psychosocial Evaluation - 05/24/22 0924       Psychosocial Evaluation & Interventions   Interventions Encouraged to exercise with the program and follow exercise prescription;Stress management education;Relaxation education    Comments She has been out of work for 4 months and has been frustrating. She has been having trouble  sleepingand wants to gether health back to normal. She was working at [employer] in [place]. Her health and being on oxygen is one of of her big stress concerns. She is in pain alot and has to use a walker.    Expected Outcomes Short: Start [facility] to help with mood. Long: Maintain a healthy mental state.    Continue Psychosocial Services  Follow up required by staff             Psychosocial Re-Evaluation:   Psychosocial Discharge (Final Psychosocial Re-Evaluation):   Education: Education Goals: Education classes will be provided on a weekly basis, covering required topics. Participant will state understanding/return demonstration of topics presented.  Learning Barriers/Preferences:  Learning Barriers/Preferences - 05/24/22 0914       Learning Barriers/Preferences   Learning Barriers None    Learning Preferences None             General Pulmonary Education Topics:  Infection Prevention: - Provides verbal and written material to individual with discussion of infection control including proper hand washing and proper equipment cleaning during exercise session. Flowsheet Row Pulmonary Rehab from 06/11/2022 in [facility] Cardiac and Pulmonary Rehab  Date 06/11/22  Educator [facility]  Instruction Review Code 1- Verbalizes Understanding       Falls Prevention: - Provides verbal and written material to individual with discussion of falls prevention and safety. Flowsheet Row Pulmonary Rehab from 06/11/2022 in [facility] Cardiac and Pulmonary Rehab  Date 06/11/22  Educator [facility]  Instruction Review Code 1- Verbalizes Understanding       Chronic Lung Disease Review: - Group verbal instruction with posters, models, PowerPoint presentations and videos,  to review new updates, new respiratory medications, new advancements in procedures and treatments. Providing information on websites and "800" numbers for continued self-education. Includes information about supplement oxygen, available portable  oxygen systems, continuous and intermittent flow rates, oxygen safety, concentrators, and Medicare reimbursement for oxygen. Explanation of Pulmonary Drugs, including class, frequency, complications, importance of spacers, rinsing mouth after steroid MDI's, and proper cleaning methods for nebulizers. Review of basic lung anatomy and physiology related to function, structure, and complications of lung disease. Review of risk factors. Discussion about methods  for diagnosing sleep apnea and types of masks and machines for OSA. Includes a review of the use of types of environmental controls: home humidity, furnaces, filters, dust mite/pet prevention, HEPA vacuums. Discussion about weather changes, air quality and the benefits of nasal washing. Instruction on Warning signs, infection symptoms, calling MD promptly, preventive modes, and value of vaccinations. Review of effective airway clearance, coughing and/or vibration techniques. Emphasizing that all should Create an Action Plan. Written material given at graduation. Flowsheet Row Pulmonary Rehab from 06/11/2022 in [facility] Cardiac and Pulmonary Rehab  Education need identified 06/11/22       AED/CPR: - Group verbal and written instruction with the use of models to demonstrate the basic use of the AED with the basic ABC's of resuscitation.    Anatomy and Cardiac Procedures: - Group verbal and visual presentation and models provide information about basic cardiac anatomy and function. Reviews the testing methods done to diagnose heart disease and the outcomes of the test results. Describes the treatment choices: Medical Management, Angioplasty, or Coronary Bypass Surgery for treating various heart conditions including Myocardial Infarction, Angina, Valve Disease, and Cardiac Arrhythmias.  Written material given at graduation.   Medication Safety: - Group verbal and visual instruction to review commonly prescribed medications for heart and lung disease.  Reviews the medication, class of the drug, and side effects. Includes the steps to properly store meds and maintain the prescription regimen.  Written material given at graduation.   Other: -Provides group and verbal instruction on various topics (see comments)   Knowledge Questionnaire Score:  Knowledge Questionnaire Score - 06/11/22 1545       Knowledge Questionnaire Score   Pre Score 15/18              Core Components/Risk Factors/Patient Goals at Admission:  Personal Goals and Risk Factors at Admission - 06/11/22 1542       Core Components/Risk Factors/Patient Goals on Admission    Weight Management Yes    Intervention Weight Management: Develop a combined nutrition and exercise program designed to reach desired caloric intake, while maintaining appropriate intake of nutrient and fiber, sodium and fats, and appropriate energy expenditure required for the weight goal.;Weight Management: Provide education and appropriate resources to help participant work on and attain dietary goals.;Weight Management/Obesity: Establish reasonable short term and long term weight goals.;Obesity: Provide education and appropriate resources to help participant work on and attain dietary goals.    Admit Weight 245 lb 6.4 oz (111.3 kg)    Goal Weight: Short Term 240 lb (108.9 kg)    Goal Weight: Long Term 220 lb (99.8 kg)    Expected Outcomes Short Term: Continue to assess and modify interventions until short term weight is achieved;Long Term: Adherence to nutrition and physical activity/exercise program aimed toward attainment of established weight goal;Weight Loss: Understanding of general recommendations for a balanced deficit meal plan, which promotes 1-2 lb weight loss per week and includes a negative energy balance of [phone] kcal/d;Understanding recommendations for meals to include 15-35% energy as protein, 25-35% energy from fat, 35-60% energy from carbohydrates, less than 200mg  of dietary  cholesterol, 20-35 gm of total fiber daily;Understanding of distribution of calorie intake throughout the day with the consumption of 4-5 meals/snacks    Tobacco Cessation Yes    Number of packs per day 0    Intervention Assist the participant in steps to quit. Provide individualized education and counseling about committing to Tobacco Cessation, relapse prevention, and pharmacological support that can be provided by physician.;Offer  self-teaching materials, assist with locating and accessing local/national Quit Smoking programs, and support quit date choice.    Expected Outcomes Short Term: Will demonstrate readiness to quit, by selecting a quit date.;Short Term: Will quit all tobacco product use, adhering to prevention of relapse plan.;Long Term: Complete abstinence from all tobacco products for at least 12 months from quit date.    Improve shortness of breath with ADL's Yes    Intervention Provide education, individualized exercise plan and daily activity instruction to help decrease symptoms of SOB with activities of daily living.    Expected Outcomes Long Term: Be able to perform more ADLs without symptoms or delay the onset of symptoms;Short Term: Improve cardiorespiratory fitness to achieve a reduction of symptoms when performing ADLs    Hypertension Yes    Intervention Provide education on lifestyle modifcations including regular physical activity/exercise, weight management, moderate sodium restriction and increased consumption of fresh fruit, vegetables, and low fat dairy, alcohol moderation, and smoking cessation.;Monitor prescription use compliance.    Expected Outcomes Short Term: Continued assessment and intervention until BP is < 140/90mm HG in hypertensive participants. < 130/74mm HG in hypertensive participants with diabetes, heart failure or chronic kidney disease.;Long Term: Maintenance of blood pressure at goal levels.    Lipids Yes    Intervention Provide education and support for  participant on nutrition & aerobic/resistive exercise along with prescribed medications to achieve LDL 70mg , HDL >40mg .    Expected Outcomes Short Term: Participant states understanding of desired cholesterol values and is compliant with medications prescribed. Participant is following exercise prescription and nutrition guidelines.;Long Term: Cholesterol controlled with medications as prescribed, with individualized exercise RX and with personalized nutrition plan. Value goals: LDL < 70mg , HDL > 40 mg.             Education:Diabetes - Individual verbal and written instruction to review signs/symptoms of diabetes, desired ranges of glucose level fasting, after meals and with exercise. Acknowledge that pre and post exercise glucose checks will be done for 3 sessions at entry of program.   Know Your Numbers and Heart Failure: - Group verbal and visual instruction to discuss disease risk factors for cardiac and pulmonary disease and treatment options.  Reviews associated critical values for Overweight/Obesity, Hypertension, Cholesterol, and Diabetes.  Discusses basics of heart failure: signs/symptoms and treatments.  Introduces Heart Failure Zone chart for action plan for heart failure.  Written material given at graduation.   Core Components/Risk Factors/Patient Goals Review:    Core Components/Risk Factors/Patient Goals at Discharge (Final Review):    ITP Comments:  ITP Comments     Row Name 05/24/22 0937 06/11/22 1528         ITP Comments Virtual Visit completed. Patient informed on EP and RD appointment and 6 Minute walk test. Patient also informed of patient health questionnaires on My Chart. Patient Verbalizes understanding. Visit diagnosis can be found in [facility] 06/11/2022. Completed and gym orientation. Initial ITP created and sent for review to Dr. Jinny Sanders, Medical Director.               Comments: initial ITP

## 2022-06-16 ENCOUNTER — Encounter: Payer: Self-pay | Admitting: Emergency Medicine

## 2022-06-16 ENCOUNTER — Ambulatory Visit
Admission: EM | Admit: 2022-06-16 | Discharge: 2022-06-16 | Disposition: A | Discharge status: Home / Self Care | Payer: BC Managed Care – PPO | Attending: Emergency Medicine | Admitting: Emergency Medicine

## 2022-06-16 DIAGNOSIS — K0889 Other specified disorders of teeth and supporting structures: Secondary | ICD-10-CM

## 2022-06-16 DIAGNOSIS — H66011 Acute suppurative otitis media with spontaneous rupture of ear drum, right ear: Secondary | ICD-10-CM

## 2022-06-16 MED ORDER — AMOXICILLIN-POT CLAVULANATE 875-125 MG PO TABS
1.0000 | ORAL_TABLET | Freq: Two times a day (BID) | ORAL | 0 refills | Status: AC
Start: 2022-06-16 — End: 2022-06-26

## 2022-06-16 NOTE — ED Triage Notes (Signed)
Patient c/o right ear pain that started 2 days ago.  Patient also reports right lower and bottom tooth pain for week.  Patient denies fevers.

## 2022-06-16 NOTE — Discharge Instructions (Signed)
Take the Augmentin twice daily for 10 days with food for treatment of your ear infection and dental infection.  Take an over-the-counter probiotic 1 hour after each dose of antibiotic to prevent diarrhea.  Use over-the-counter Tylenol and ibuprofen as needed for pain or fever.  You can apply clove oil to your tooth to help with pain.  Place a hot water bottle, or heating pad, underneath your pillowcase at night to help dilate up your ear and aid in pain relief as well as resolution of the infection.  Return for reevaluation for any new or worsening symptoms.

## 2022-06-16 NOTE — ED Provider Notes (Signed)
[facility]    CSN: [id number] Arrival date & time: 06/16/22  1249      History   Chief Complaint Chief Complaint  Patient presents with   Otalgia    right   Dental Pain    HPI Kelly Trujillo is a 61 y.o. female.   HPI  61 year old female here for evaluation of ear and dental complaints.  Patient reports that she has been experiencing pain in her right ear and drainage for the last 2 days.  She has a known hole in her tympanic membrane.  She states that she has been using over-the-counter super otic eardrops but that they are expired.  She denies any ringing in her ear or fever.  Patient's other complaint is that she has been experiencing pain and her upper and lower teeth for the past week.  She mainly notices the pain at night when she is wearing her CPAP machine and the air blows across her teeth.  She denies any drainage.  She was recently treated for thrush.  Past Medical History:  Diagnosis Date   COPD (chronic obstructive pulmonary disease) ([place])    Hypertension     There are no problems to display for this patient.   Past Surgical History:  Procedure Laterality Date   ABDOMINAL HYSTERECTOMY     CYST EXCISION  1980s   KNEE ARTHROSCOPY WITH MEDIAL MENISECTOMY Left 08/16/2016   Procedure: KNEE ARTHROSCOPY WITH SYNOVECTOMY, MEDIAL AND LATERAL MENISCUS TEAR, LOOSE BODY;  Surgeon: Hessie Knows, MD;  Location: [facility] ORS;  Service: Orthopedics;  Laterality: Left;   TYMPANOPLASTY      OB History   No obstetric history on file.      Home Medications    Prior to Admission medications   Medication Sig Start Date End Date Taking? Authorizing Provider  amoxicillin-clavulanate (AUGMENTIN) 875-125 MG tablet Take 1 tablet by mouth every 12 (twelve) hours for 10 days. 06/16/22 06/26/22 Yes Margarette Canada, NP  acetaminophen (TYLENOL) 325 MG tablet Take by mouth.    [provider]  acetaminophen (TYLENOL) 325 MG tablet Take by mouth. Patient not taking:  Reported on 05/24/2022    [provider]  ADVAIR DISKUS 500-50 MCG/DOSE AEPB  09/12/18   [provider]  albuterol (VENTOLIN HFA) 108 (90 Base) MCG/ACT inhaler Inhale into the lungs. 04/23/19   [provider]  B Complex Vitamins (B-COMPLEX/B-12 PO) Take 1 tablet by mouth every morning. Patient not taking: Reported on 05/24/2022    [provider]  Calcium Carbonate-Vit D-Min (CALCIUM 1200 PO) Take 1 tablet by mouth every morning. Patient not taking: Reported on 05/24/2022    [provider]  Cholecalciferol (VITAMIN D-1000 MAX ST) 25 MCG (1000 UT) tablet Take by mouth. Patient not taking: Reported on 05/24/2022    [provider]  Cholecalciferol (VITAMIN D-3) 1000 units CAPS Take 2 capsules by mouth every morning.    [provider]  cyclobenzaprine (FLEXERIL) 5 MG tablet Take by mouth. 05/22/22   [provider]  diclofenac sodium (VOLTAREN) 1 % GEL Apply 2 g topically 4 (four) times daily.    [provider]  fluticasone-salmeterol (ADVAIR HFA) 230-21 MCG/ACT inhaler Inhale into the lungs. 03/21/22 03/21/23  [provider]  folic acid (FOLVITE) 1 MG tablet Take 1 mg by mouth daily.    [provider]  furosemide (LASIX) 40 MG tablet Take by mouth. 02/26/22   [provider]  hydrochlorothiazide (HYDRODIURIL) 25 MG tablet Take 25 mg by mouth  daily. 07/15/18   [provider]  hydroxychloroquine (PLAQUENIL) 200 MG tablet Take by mouth. 04/04/22 04/04/23  [provider]  ibuprofen (ADVIL,MOTRIN) 800 MG tablet Take 800 mg by mouth every 8 (eight) hours as needed. Patient not taking: Reported on 05/24/2022    [provider]  magnesium oxide (MAG-OX) 400 MG tablet Take by mouth. 04/16/22 04/16/23  [provider]  methotrexate (RHEUMATREX) 2.5 MG tablet Take by mouth. 02/27/22   [provider]  Misc Natural Products (GREEN TEA) TABS Take by mouth daily. Patient not  taking: Reported on 05/24/2022    [provider]  Multiple Vitamin (MULTI-VITAMINS) TABS Take by mouth. Patient not taking: Reported on 05/24/2022    [provider]  Multiple Vitamin (MULTI-VITAMINS) TABS Take 1 tablet by mouth daily. Patient not taking: Reported on 05/24/2022    [provider]  Multiple Vitamins-Minerals (MULTIVITAMIN WITH MINERALS) tablet Take 1 tablet by mouth daily. Patient not taking: Reported on 05/24/2022    [provider]  nystatin (MYCOSTATIN) 100000 UNIT/ML suspension Take by mouth. 05/22/22   [provider]  omeprazole (PRILOSEC) 20 MG capsule Take 1 capsule by mouth every morning. 10/10/21 10/16/22  [provider]  potassium chloride SA (KLOR-CON M) 20 MEQ tablet Take 1 tablet by mouth daily. 03/21/22 03/21/23  [provider]  predniSONE (DELTASONE) 5 MG tablet Take by mouth. 04/04/22   [provider]  sulfaSALAzine (AZULFIDINE) 500 MG EC tablet Take by mouth. 04/04/22 04/04/23  [provider]  Tiotropium Bromide Monohydrate 2.5 MCG/ACT AERS Inhale into the lungs. 05/18/22   [provider]  Turmeric 500 MG CAPS Take by mouth daily.    [provider]  VITAMIN E PO Take 180 mg by mouth daily. Patient not taking: Reported on 05/24/2022    [provider]    Family History Family History  Problem Relation Age of Onset   Colon cancer Mother    Heart failure Father     Social History Social History   Tobacco Use   Smoking status: Former    Packs/day: 1.00    Years: 35.00    Total pack years: 35.00    Types: Cigarettes    Quit date: 01/19/2022    Years since quitting: 0.4   Smokeless tobacco: Never  Vaping Use   Vaping Use: Never used  Substance Use Topics   Alcohol use: Not Currently    Comment: rare   Drug use: No     Allergies   Shellfish allergy, Other, and Sulfa antibiotics   Review of Systems Review of Systems  Constitutional:  Negative for  fever.  HENT:  Positive for dental problem, ear discharge and ear pain. Negative for hearing loss and tinnitus.      Physical Exam Triage Vital Signs ED Triage Vitals  Enc Vitals Group     BP 06/16/22 1315 116/68     Pulse Rate 06/16/22 1315 80     Resp 06/16/22 1315 15     Temp 06/16/22 1315 97.9 F (36.6 C)     Temp Source 06/16/22 1315 Oral     SpO2 06/16/22 1315 96 %     Weight 06/16/22 1311 245 lb 6 oz (111.3 kg)     Height 06/16/22 1311 5' 5.5" (1.664 m)     Head Circumference --      Peak Flow --      Pain Score 06/16/22 1310 2     Pain Loc --  Pain Edu? --      Excl. in GC? --    No data found.  Updated Vital Signs BP 116/68 (BP Location: Left Arm)   Pulse 80   Temp 97.9 F (36.6 C) (Oral)   Resp 15   Ht 5' 5.5" (1.664 m)   Wt 245 lb 6 oz (111.3 kg)   SpO2 96%   BMI 40.21 kg/m   Visual Acuity Right Eye Distance:   Left Eye Distance:   Bilateral Distance:    Right Eye Near:   Left Eye Near:    Bilateral Near:     Physical Exam Vitals and nursing note reviewed.  Constitutional:      Appearance: Normal appearance. She is not ill-appearing.  HENT:     Head: Normocephalic and atraumatic.     Right Ear: Ear canal and external ear normal. There is no impacted cerumen.     Left Ear: Tympanic membrane, ear canal and external ear normal. There is no impacted cerumen.     Mouth/Throat:     Mouth: Mucous membranes are moist.     Pharynx: Oropharynx is clear. No oropharyngeal exudate or posterior oropharyngeal erythema.  Skin:    General: Skin is warm and dry.     Capillary Refill: Capillary refill takes less than 2 seconds.     Findings: No erythema or rash.  Neurological:     General: No focal deficit present.     Mental Status: She is alert and oriented to person, place, and time.  Psychiatric:        Mood and Affect: Mood normal.        Behavior: Behavior normal.        Thought Content: Thought content normal.        Judgment: Judgment normal.       UC Treatments / Results  Labs (all labs ordered are listed, but only abnormal results are displayed) Labs Reviewed - No data to display  EKG   Radiology No results found.  Procedures Procedures (including critical care time)  Medications Ordered in UC Medications - No data to display  Initial Impression / Assessment and Plan / UC Course  I have reviewed the triage vital signs and the nursing notes.  Pertinent labs & imaging results that were available during my care of the patient were reviewed by me and considered in my medical decision making (see chart for details).  Patient is a pleasant, nontoxic-appearing 61 year old female here for evaluation of dental and ear complaints as outlined HPI above.  On exam patient has clear external auditory canals bilaterally.  The left TM is opaque in its appearance but free of erythema.  Patient reports that she has had reconstructive surgery on both of her ears.  The right tympanic membrane has a very large hole with some erythema to the rim.  There is some yellow debris in the ear canal.  Patient's oropharyngeal exam reveals no erythema or edema to the gum tissue.  No exudate noted.  Patient does have tenderness to percussion of upper and lower rear molars on the right side.  There is no overt DKA noted.  Given patient's tendency to temperature change and tenderness to percussion I am concerned she may have an apical abscess.  I will treat her otitis media and potential apical abscess with Augmentin twice daily for 10 days.  Return precautions reviewed.   Final Clinical Impressions(s) / UC Diagnoses   Final diagnoses:  Non-recurrent acute suppurative otitis media of  right ear with spontaneous rupture of tympanic membrane  Pain, dental     Discharge Instructions      Take the Augmentin twice daily for 10 days with food for treatment of your ear infection and dental infection.  Take an over-the-counter probiotic 1 hour after each  dose of antibiotic to prevent diarrhea.  Use over-the-counter Tylenol and ibuprofen as needed for pain or fever.  You can apply clove oil to your tooth to help with pain.  Place a hot water bottle, or heating pad, underneath your pillowcase at night to help dilate up your ear and aid in pain relief as well as resolution of the infection.  Return for reevaluation for any new or worsening symptoms.      ED Prescriptions     Medication Sig Dispense Auth. Provider   amoxicillin-clavulanate (AUGMENTIN) 875-125 MG tablet Take 1 tablet by mouth every 12 (twelve) hours for 10 days. 20 tablet Margarette Canada, NP      PDMP not reviewed this encounter.   Margarette Canada, NP 06/16/22 1338

## 2022-06-18 ENCOUNTER — Encounter: Payer: BC Managed Care – PPO | Attending: Internal Medicine | Admitting: *Deleted

## 2022-06-18 DIAGNOSIS — J449 Chronic obstructive pulmonary disease, unspecified: Secondary | ICD-10-CM | POA: Diagnosis present

## 2022-06-18 MED FILL — MAGNESIUM OXIDE 400 MG (241.3 MG MAGNESIUM) TABLET: ORAL | 30 days supply | Qty: 30 | Fill #2

## 2022-06-18 NOTE — Progress Notes (Signed)
Daily Session Note  Patient Details  Name: Kelly Trujillo MRN: [id number] Date of Birth: 02-15-1961 Referring Provider:   Flowsheet Row Pulmonary Rehab from 06/11/2022 in [facility] Cardiac and Pulmonary Rehab  Referring Provider Tai Wang       Encounter Date: 06/18/2022  Check In:  Session Check In - 06/18/22 1402       Check-In   Supervising physician immediately available to respond to emergencies See telemetry face sheet for immediately available ER MD    Location [facility]-Cardiac & Pulmonary Rehab    Staff Present Nyoka Cowden, RN, BSN, Lauretta Grill, RCP,RRT,BSRT;Kara [place], MS, ASCM CEP, Exercise Physiologist;Melissa Tilford Pillar, RDN, LDN    Virtual Visit No    Medication changes reported     No    Fall or balance concerns reported    No    Tobacco Cessation No Change    Warm-up and Cool-down Performed on first and last piece of equipment    Resistance Training Performed Yes    VAD Patient? No    PAD/SET Patient? No      Pain Assessment   Currently in Pain? No/denies                Social History   Tobacco Use  Smoking Status Former   Packs/day: 1.00   Years: 35.00   Total pack years: 35.00   Types: Cigarettes   Quit date: 01/19/2022   Years since quitting: 0.4  Smokeless Tobacco Never    Goals Met:  Independence with exercise equipment Exercise tolerated well No report of concerns or symptoms today  Goals Unmet:  Not Applicable  Comments: Pt able to follow exercise prescription today without complaint.  Will continue to monitor for progression.    Dr. Emily Filbert is Medical Director for [place].  Dr. Ottie Glazier is Medical Director for [facility] Pulmonary Rehabilitation.

## 2022-06-20 DIAGNOSIS — J449 Chronic obstructive pulmonary disease, unspecified: Secondary | ICD-10-CM

## 2022-06-20 NOTE — Progress Notes (Signed)
Daily Session Note  Patient Details  Name: Kelly Trujillo MRN: [id number] Date of Birth: [birth year] Referring Provider:   Flowsheet Row Pulmonary Rehab from 06/11/2022 in [facility] Cardiac and Pulmonary Rehab  Referring Provider Tai Wang       Encounter Date: 06/20/2022  Check In:  Session Check In - 06/20/22 1343       Check-In   Supervising physician immediately available to respond to emergencies See telemetry face sheet for immediately available ER MD    Location [facility]-Cardiac & Pulmonary Rehab    Staff Present Antionette Fairy, BS, Exercise Physiologist;Susanne Bice, RN, BSN, CCRP;Rashawna Scoles, MPA, RN;Joseph [place], [place]    Virtual Visit No    Medication changes reported     No    Fall or balance concerns reported    No    Tobacco Cessation No Change    Warm-up and Cool-down Performed on first and last piece of equipment    Resistance Training Performed Yes    VAD Patient? No    PAD/SET Patient? No      Pain Assessment   Currently in Pain? No/denies    Multiple Pain Sites No                Social History   Tobacco Use  Smoking Status Former   Packs/day: 1.00   Years: 35.00   Total pack years: 35.00   Types: Cigarettes   Quit date: 01/19/2022   Years since quitting: 0.4  Smokeless Tobacco Never    Goals Met:  Independence with exercise equipment Exercise tolerated well No report of concerns or symptoms today Strength training completed today  Goals Unmet:  Not Applicable  Comments: Pt able to follow exercise prescription today without complaint.  Will continue to monitor for progression.    Dr. Emily Filbert is Medical Director for [place].  Dr. Ottie Glazier is Medical Director for [facility] Pulmonary Rehabilitation.

## 2022-06-21 ENCOUNTER — Encounter: Payer: BC Managed Care – PPO | Admitting: *Deleted

## 2022-06-21 DIAGNOSIS — J449 Chronic obstructive pulmonary disease, unspecified: Secondary | ICD-10-CM

## 2022-06-21 NOTE — Progress Notes (Signed)
Daily Session Note  Patient Details  Name: Kelly Trujillo MRN: [id number] Date of Birth: 1961/10/29 Referring Provider:   Flowsheet Row Pulmonary Rehab from 06/11/2022 in [facility]  Referring Provider Tai Wang       Encounter Date: 06/21/2022  Check In:  Session Check In - 06/21/22 1340       Check-In   Supervising physician immediately available to respond to emergencies See telemetry face sheet for immediately available ER MD    Location [facility]-Cardiac & Pulmonary Rehab    Staff Present Antionette Fairy, BS, Exercise Physiologist;Joseph Ironwood, RCP,RRT,BSRT;Jessica Leadwood, MA, RCEP, CCRP, CCET;Samhita Kretsch, RN, BSN, CCRP;Melissa Colton, RDN, LDN    Virtual Visit No    Medication changes reported     No    Fall or balance concerns reported    No    Tobacco Cessation No Change    Warm-up and Cool-down Performed on first and last piece of equipment    Resistance Training Performed Yes    VAD Patient? No    PAD/SET Patient? No      Pain Assessment   Currently in Pain? No/denies    Multiple Pain Sites No                Social History   Tobacco Use  Smoking Status Former   Packs/day: 1.00   Years: 35.00   Total pack years: 35.00   Types: Cigarettes   Quit date: 01/19/2022   Years since quitting: 0.4  Smokeless Tobacco Never    Goals Met:  Independence with exercise equipment Exercise tolerated well No report of concerns or symptoms today  Goals Unmet:  Not Applicable  Comments: Pt able to follow exercise prescription today without complaint.  Will continue to monitor for progression.    Dr. Emily Filbert is Medical Director for [facility].  Dr. Ottie Glazier is Medical Director for [facility] Pulmonary Rehabilitation.

## 2022-06-25 ENCOUNTER — Ambulatory Visit: Payer: BC Managed Care – PPO

## 2022-06-25 DIAGNOSIS — J449 Chronic obstructive pulmonary disease, unspecified: Secondary | ICD-10-CM

## 2022-06-25 NOTE — Progress Notes (Signed)
Completed initial RD consultation ?

## 2022-06-26 MED ORDER — MELOXICAM 15 MG TABLET
ORAL_TABLET | Freq: Every day | ORAL | 3 refills | 15 days | Status: CP
Start: 2022-06-26 — End: 2023-06-26

## 2022-06-26 NOTE — Progress Notes (Signed)
Cancelled in error

## 2022-06-27 ENCOUNTER — Encounter: Payer: Self-pay | Admitting: *Deleted

## 2022-06-27 DIAGNOSIS — J449 Chronic obstructive pulmonary disease, unspecified: Secondary | ICD-10-CM

## 2022-06-27 NOTE — Progress Notes (Signed)
Pulmonary Individual Treatment Plan  Patient Details  Name: Kelly Trujillo MRN: [id number] Date of Birth: December 03, 1961 Referring Provider:   Flowsheet Row Pulmonary Rehab from 06/11/2022 in [facility] Cardiac and Pulmonary Rehab  Referring Provider Tai Wang       Initial Encounter Date:  Flowsheet Row Pulmonary Rehab from 06/11/2022 in [facility] Cardiac and Pulmonary Rehab  Date 06/11/22       Visit Diagnosis: Chronic obstructive pulmonary disease, unspecified COPD type ([place])  Patient's Home Medications on Admission:  Current Outpatient Medications:    acetaminophen (TYLENOL) 325 MG tablet, Take by mouth., Disp: , Rfl:    acetaminophen (TYLENOL) 325 MG tablet, Take by mouth. (Patient not taking: Reported on 05/24/2022), Disp: , Rfl:    ADVAIR DISKUS 500-50 MCG/DOSE AEPB, , Disp: , Rfl:    albuterol (VENTOLIN HFA) 108 (90 Base) MCG/ACT inhaler, Inhale into the lungs., Disp: , Rfl:    B Complex Vitamins (B-COMPLEX/B-12 PO), Take 1 tablet by mouth every morning. (Patient not taking: Reported on 05/24/2022), Disp: , Rfl:    Calcium Carbonate-Vit D-Min (CALCIUM 1200 PO), Take 1 tablet by mouth every morning. (Patient not taking: Reported on 05/24/2022), Disp: , Rfl:    Cholecalciferol (VITAMIN D-1000 MAX ST) 25 MCG (1000 UT) tablet, Take by mouth. (Patient not taking: Reported on 05/24/2022), Disp: , Rfl:    Cholecalciferol (VITAMIN D-3) 1000 units CAPS, Take 2 capsules by mouth every morning., Disp: , Rfl:    cyclobenzaprine (FLEXERIL) 5 MG tablet, Take by mouth., Disp: , Rfl:    diclofenac sodium (VOLTAREN) 1 % GEL, Apply 2 g topically 4 (four) times daily., Disp: , Rfl:    fluticasone-salmeterol (ADVAIR HFA) 230-21 MCG/ACT inhaler, Inhale into the lungs., Disp: , Rfl:    folic acid (FOLVITE) 1 MG tablet, Take 1 mg by mouth daily., Disp: , Rfl:    furosemide (LASIX) 40 MG tablet, Take by mouth., Disp: , Rfl:    hydrochlorothiazide (HYDRODIURIL) 25 MG tablet, Take 25 mg by mouth daily., Disp: , Rfl: 5    hydroxychloroquine (PLAQUENIL) 200 MG tablet, Take by mouth., Disp: , Rfl:    ibuprofen (ADVIL,MOTRIN) 800 MG tablet, Take 800 mg by mouth every 8 (eight) hours as needed. (Patient not taking: Reported on 05/24/2022), Disp: , Rfl:    magnesium oxide (MAG-OX) 400 MG tablet, Take by mouth., Disp: , Rfl:    methotrexate (RHEUMATREX) 2.5 MG tablet, Take by mouth., Disp: , Rfl:    Misc Natural Products (GREEN TEA) TABS, Take by mouth daily. (Patient not taking: Reported on 05/24/2022), Disp: , Rfl:    Multiple Vitamin (MULTI-VITAMINS) TABS, Take by mouth. (Patient not taking: Reported on 05/24/2022), Disp: , Rfl:    Multiple Vitamin (MULTI-VITAMINS) TABS, Take 1 tablet by mouth daily. (Patient not taking: Reported on 05/24/2022), Disp: , Rfl:    Multiple Vitamins-Minerals (MULTIVITAMIN WITH MINERALS) tablet, Take 1 tablet by mouth daily. (Patient not taking: Reported on 05/24/2022), Disp: , Rfl:    nystatin (MYCOSTATIN) 100000 UNIT/ML suspension, Take by mouth., Disp: , Rfl:    omeprazole (PRILOSEC) 20 MG capsule, Take 1 capsule by mouth every morning., Disp: , Rfl:    potassium chloride SA (KLOR-CON M) 20 MEQ tablet, Take 1 tablet by mouth daily., Disp: , Rfl:    predniSONE (DELTASONE) 5 MG tablet, Take by mouth., Disp: , Rfl:    sulfaSALAzine (AZULFIDINE) 500 MG EC tablet, Take by mouth., Disp: , Rfl:    Tiotropium Bromide Monohydrate 2.5 MCG/ACT AERS, Inhale into the lungs., Disp: ,  Rfl:    Turmeric 500 MG CAPS, Take by mouth daily., Disp: , Rfl:    VITAMIN E PO, Take 180 mg by mouth daily. (Patient not taking: Reported on 05/24/2022), Disp: , Rfl:   Past Medical History: Past Medical History:  Diagnosis Date   COPD (chronic obstructive pulmonary disease) ([place])    Hypertension     Tobacco Use: Social History   Tobacco Use  Smoking Status Former   Packs/day: 1.00   Years: 35.00   Total pack years: 35.00   Types: Cigarettes   Quit date: 01/19/2022   Years since quitting: 0.4  Smokeless Tobacco Never     Labs: Review Flowsheet        No data to display           Pulmonary Assessment Scores:  Pulmonary Assessment Scores     Row Name 06/11/22 1547         ADL UCSD   SOB Score total 22     Rest 0     Walk 0     Stairs 1     Bath 2     Dress 2     Shop 2       CAT Score   CAT Score 9       mMRC Score   mMRC Score 0              UCSD: Self-administered rating of dyspnea associated with activities of daily living (ADLs) 6-point scale (0 = "not at all" to 5 = "maximal or unable to do because of breathlessness")  Scoring Scores range from 0 to 120.  Minimally important difference is 5 units  CAT: CAT can identify the health impairment of COPD patients and is better correlated with disease progression.  CAT has a scoring range of zero to 40. The CAT score is classified into four groups of low (less than 10), medium (10 - 20), high (21-30) and very high (31-40) based on the impact level of disease on health status. A CAT score over 10 suggests significant symptoms.  A worsening CAT score could be explained by an exacerbation, poor medication adherence, poor inhaler technique, or progression of COPD or comorbid conditions.  CAT MCID is 2 points  mMRC: mMRC (Modified Medical Research Council) Dyspnea Scale is used to assess the degree of baseline functional disability in patients of respiratory disease due to dyspnea. No minimal important difference is established. A decrease in score of 1 point or greater is considered a positive change.   Pulmonary Function Assessment:  Pulmonary Function Assessment - 05/24/22 0913       Breath   Shortness of Breath Yes;Limiting activity             Exercise Target Goals: Exercise Program Goal: Individual exercise prescription set using results from initial 6 min walk test and THRR while considering  patient's activity barriers and safety.   Exercise Prescription Goal: Initial exercise prescription builds to 30-45  minutes a day of aerobic activity, 2-3 days per week.  Home exercise guidelines will be given to patient during program as part of exercise prescription that the participant will acknowledge.  Education: Aerobic Exercise: - Group verbal and visual presentation on the components of exercise prescription. Introduces F.I.T.T principle from ACSM for exercise prescriptions.  Reviews F.I.T.T. principles of aerobic exercise including progression. Written material given at graduation. Flowsheet Row Pulmonary Rehab from 06/20/2022 in [facility] Cardiac and Pulmonary Rehab  Date 06/20/22  Educator Craig Staggers  Instruction  Review Code 1- Verbalizes Understanding       Education: Resistance Exercise: - Group verbal and visual presentation on the components of exercise prescription. Introduces F.I.T.T principle from ACSM for exercise prescriptions  Reviews F.I.T.T. principles of resistance exercise including progression. Written material given at graduation.    Education: Exercise & Equipment Safety: - Individual verbal instruction and demonstration of equipment use and safety with use of the equipment. Flowsheet Row Pulmonary Rehab from 06/20/2022 in [facility] Cardiac and Pulmonary Rehab  Date 06/11/22  Educator [facility]  Instruction Review Code 1- Verbalizes Understanding       Education: Exercise Physiology & General Exercise Guidelines: - Group verbal and written instruction with models to review the exercise physiology of the cardiovascular system and associated critical values. Provides general exercise guidelines with specific guidelines to those with heart or lung disease.    Education: Flexibility, Balance, Mind/Body Relaxation: - Group verbal and visual presentation with interactive activity on the components of exercise prescription. Introduces F.I.T.T principle from ACSM for exercise prescriptions. Reviews F.I.T.T. principles of flexibility and balance exercise training including progression. Also discusses the mind  body connection.  Reviews various relaxation techniques to help reduce and manage stress (i.e. Deep breathing, progressive muscle relaxation, and visualization). Balance handout provided to take home. Written material given at graduation.   Activity Barriers & Risk Stratification:   6 Minute Walk:  6 Minute Walk     Row Name 06/11/22 1530         6 Minute Walk   Phase Initial     Distance 665 feet     Walk Time 6 minutes     # of Rest Breaks 0     MPH 1.23     METS 1.81     RPE 11     Perceived Dyspnea  1     VO2 Peak 6.33     Symptoms No     Resting HR 83 bpm     Resting BP 124/80     Resting Oxygen Saturation  93 %     Exercise Oxygen Saturation  during 6 min walk 86 %     Max Ex. HR 119 bpm     Max Ex. BP 138/88     2 Minute Post BP 124/80       Interval HR   1 Minute HR 90     2 Minute HR 111     3 Minute HR 114     4 Minute HR 111     5 Minute HR 119     6 Minute HR 118     Interval Heart Rate? Yes       Interval Oxygen   Interval Oxygen? Yes     Baseline Oxygen Saturation % 93 %     1 Minute Oxygen Saturation % 90 %     1 Minute Liters of Oxygen 2 L     2 Minute Oxygen Saturation % 87 %     2 Minute Liters of Oxygen 2 L     3 Minute Oxygen Saturation % 87 %     3 Minute Liters of Oxygen 2 L     4 Minute Oxygen Saturation % 89 %     4 Minute Liters of Oxygen 2 L     5 Minute Oxygen Saturation % 86 %     5 Minute Liters of Oxygen 2 L     6 Minute Oxygen Saturation % 86 %  6 Minute Liters of Oxygen 2 L     2 Minute Post Oxygen Saturation % 94 %     2 Minute Post Liters of Oxygen 2 L             Oxygen Initial Assessment:  Oxygen Initial Assessment - 06/11/22 1546       Home Oxygen   Home Oxygen Device Home Concentrator;Portable Concentrator;E-Tanks    Sleep Oxygen Prescription BiPAP    Liters per minute 2    Home Exercise Oxygen Prescription Continuous    Liters per minute 2    Home Resting Oxygen Prescription Continuous    Liters per  minute 2    Compliance with Home Oxygen Use Yes      Initial 6 min Walk   Oxygen Used Continuous    Liters per minute 2      Program Oxygen Prescription   Program Oxygen Prescription Continuous    Liters per minute 2      Intervention   Short Term Goals To learn and exhibit compliance with exercise, home and travel O2 prescription;To learn and understand importance of monitoring SPO2 with pulse oximeter and demonstrate accurate use of the pulse oximeter.;To learn and understand importance of maintaining oxygen saturations>88%;To learn and demonstrate proper pursed lip breathing techniques or other breathing techniques. ;To learn and demonstrate proper use of respiratory medications    Long  Term Goals Exhibits compliance with exercise, home  and travel O2 prescription;Verbalizes importance of monitoring SPO2 with pulse oximeter and return demonstration;Maintenance of O2 saturations>88%;Exhibits proper breathing techniques, such as pursed lip breathing or other method taught during program session;Compliance with respiratory medication;Demonstrates proper use of MDI's             Oxygen Re-Evaluation:   Oxygen Discharge (Final Oxygen Re-Evaluation):   Initial Exercise Prescription:  Initial Exercise Prescription - 06/11/22 1500       Date of Initial Exercise RX and Referring Provider   Date 06/11/22    Referring Provider Tai Mina Marble      Oxygen   Oxygen Continuous    Liters 2    Maintain Oxygen Saturation 88% or higher      Recumbant Bike   Level 1    RPM 50    Minutes 15    METs 1.81      NuStep   Level 1    SPM 80    Minutes 15    METs 1.81      Arm Ergometer   Level 1    RPM 30    Minutes 15    METs 1.81      Biostep-RELP   Level 1    SPM 50    Minutes 15    METs 1.81      Track   Laps 15    Minutes 15    METs 1.82      Prescription Details   Frequency (times per week) 3    Duration Progress to 30 minutes of continuous aerobic without  signs/symptoms of physical distress      Intensity   THRR 40-80% of Max Heartrate 113-143    Ratings of Perceived Exertion 11-13    Perceived Dyspnea 0-4      Progression   Progression Continue to progress workloads to maintain intensity without signs/symptoms of physical distress.      Resistance Training   Training Prescription Yes    Weight 2    Reps 10-15  Perform Capillary Blood Glucose checks as needed.  Exercise Prescription Changes:   Exercise Prescription Changes     Row Name 06/11/22 1500             Response to Exercise   Blood Pressure (Admit) 124/80       Blood Pressure (Exercise) 138/88       Blood Pressure (Exit) 124/80       Heart Rate (Admit) 83 bpm       Heart Rate (Exercise) 119 bpm       Heart Rate (Exit) 90 bpm       Oxygen Saturation (Admit) 93 %       Oxygen Saturation (Exercise) 86 %       Oxygen Saturation (Exit) 94 %       Rating of Perceived Exertion (Exercise) 11       Perceived Dyspnea (Exercise) 1       Comments 6 MWT results                Exercise Comments:   Exercise Goals and Review:   Exercise Goals     Row Name 06/11/22 1540             Exercise Goals   Increase Physical Activity Yes       Intervention Provide advice, education, support and counseling about physical activity/exercise needs.;Develop an individualized exercise prescription for aerobic and resistive training based on initial evaluation findings, risk stratification, comorbidities and participant's personal goals.       Expected Outcomes Short Term: Attend rehab on a regular basis to increase amount of physical activity.;Long Term: Add in home exercise to make exercise part of routine and to increase amount of physical activity.;Long Term: Exercising regularly at least 3-5 days a week.       Increase Strength and Stamina Yes       Intervention Provide advice, education, support and counseling about physical activity/exercise  needs.;Develop an individualized exercise prescription for aerobic and resistive training based on initial evaluation findings, risk stratification, comorbidities and participant's personal goals.       Expected Outcomes Short Term: Increase workloads from initial exercise prescription for resistance, speed, and METs.;Short Term: Perform resistance training exercises routinely during rehab and add in resistance training at home;Long Term: Improve cardiorespiratory fitness, muscular endurance and strength as measured by increased METs and functional capacity (6MWT)       Able to understand and use rate of perceived exertion (RPE) scale Yes       Intervention Provide education and explanation on how to use RPE scale       Expected Outcomes Short Term: Able to use RPE daily in rehab to express subjective intensity level;Long Term:  Able to use RPE to guide intensity level when exercising independently       Able to understand and use Dyspnea scale Yes       Intervention Provide education and explanation on how to use Dyspnea scale       Expected Outcomes Short Term: Able to use Dyspnea scale daily in rehab to express subjective sense of shortness of breath during exertion;Long Term: Able to use Dyspnea scale to guide intensity level when exercising independently       Knowledge and understanding of Target Heart Rate Range (THRR) Yes       Intervention Provide education and explanation of THRR including how the numbers were predicted and where they are located for reference       Expected Outcomes  Short Term: Able to state/look up THRR;Short Term: Able to use daily as guideline for intensity in rehab;Long Term: Able to use THRR to govern intensity when exercising independently       Able to check pulse independently Yes       Intervention Provide education and demonstration on how to check pulse in carotid and radial arteries.;Review the importance of being able to check your own pulse for safety during  independent exercise       Expected Outcomes Short Term: Able to explain why pulse checking is important during independent exercise;Long Term: Able to check pulse independently and accurately       Understanding of Exercise Prescription Yes       Intervention Provide education, explanation, and written materials on patient's individual exercise prescription       Expected Outcomes Short Term: Able to explain program exercise prescription;Long Term: Able to explain home exercise prescription to exercise independently                Exercise Goals Re-Evaluation :   Discharge Exercise Prescription (Final Exercise Prescription Changes):  Exercise Prescription Changes - 06/11/22 1500       Response to Exercise   Blood Pressure (Admit) 124/80    Blood Pressure (Exercise) 138/88    Blood Pressure (Exit) 124/80    Heart Rate (Admit) 83 bpm    Heart Rate (Exercise) 119 bpm    Heart Rate (Exit) 90 bpm    Oxygen Saturation (Admit) 93 %    Oxygen Saturation (Exercise) 86 %    Oxygen Saturation (Exit) 94 %    Rating of Perceived Exertion (Exercise) 11    Perceived Dyspnea (Exercise) 1    Comments 6 MWT results             Nutrition:  Target Goals: Understanding of nutrition guidelines, daily intake of sodium <1565m, cholesterol <2031m calories 30% from fat and 7% or less from saturated fats, daily to have 5 or more servings of fruits and vegetables.  Education: All About Nutrition: -Group instruction provided by verbal, written material, interactive activities, discussions, models, and posters to present general guidelines for heart healthy nutrition including fat, fiber, MyPlate, the role of sodium in heart healthy nutrition, utilization of the nutrition label, and utilization of this knowledge for meal planning. Follow up email sent as well. Written material given at graduation.   Biometrics:  Pre Biometrics - 06/11/22 1541       Pre Biometrics   Height 5' 6.5" (1.689 m)     Weight 245 lb 6.4 oz (111.3 kg)    BMI (Calculated) 39.02    Single Leg Stand 2.2 seconds              Nutrition Therapy Plan and Nutrition Goals:  Nutrition Therapy & Goals - 06/25/22 1513       Nutrition Therapy   Diet Heart healthy, low Na, T2DM MNT    Protein (specify units) 95g    Fiber 25 grams    Whole Grain Foods 3 servings    Saturated Fats 12 max. grams    Fruits and Vegetables 8 servings/day    Sodium 2 grams      Personal Nutrition Goals   Nutrition Goal ST: Pairing meals with nutritionally dense food. Utilize frozen vegetables, batch cooking, and ingredient prep LT: follow Myplate guidelines, limit saturated fat <12g/day, limit added sugar <24g/day    Comments 6 65.o. F admitted to pulmonary rehab for COPD. PMHx HFpEF, RA, T2DM,  HTN. Relevant medications includes vit D3, MVI, turmeric, green tea extract, folvite, mag-ox, omeprazole, lasix, cyclobenzaprine, hydrochlorothiazide, potassium, prednisone. PYP Score: 56. Vegetables & Fruits 5/12. Breads, Grains & Cereals 6/12. Red & Processed Meat 8/12. Poultry 2/2. Fish & Shellfish 1/4. Beans, Nuts & Seeds 1/4. Milk & Dairy Foods 2/6. Toppings, Oils, Seasonings & Salt 14/20. Sweets, Snacks & Restaurant Food 7/14. Beverages 10/10.  B: fruit with cottage cheese L: sandwich S: doughnuts, carrot cake, baked goods D: steak with potato and greens, pizza or cheese burgers, or takeout S: ice cream or strawberries with cake Drinks: water, dr pepper on occasion. She reports that she is on prednisone so her hunger is elevated - especially during snacking. She reports that she has been craving sweets more so than usual - she feels that it has to do with her mobility as well as quitting smoking (February 2023). Discussed heart healthy eating and T2DM MNT. She reports that her pain is a barrier for her - discussed when she does cook to make extra to freeze, utilize convenience foods such as frozen vegetables and microwave grains, and ingredient  prepping on days when her pain is better. Suggested adding before taking away - having a smaller portion of baked goods and pairing them with cottage cheese or peanut butter for example.      Intervention Plan   Intervention Prescribe, educate and counsel regarding individualized specific dietary modifications aiming towards targeted core components such as weight, hypertension, lipid management, diabetes, heart failure and other comorbidities.    Expected Outcomes Short Term Goal: Understand basic principles of dietary content, such as calories, fat, sodium, cholesterol and nutrients.;Short Term Goal: A plan has been developed with personal nutrition goals set during dietitian appointment.;Long Term Goal: Adherence to prescribed nutrition plan.             Nutrition Assessments:  MEDIFICTS Score Key: ?70 Need to make dietary changes  40-70 Heart Healthy Diet ? 40 Therapeutic Level Cholesterol Diet  Flowsheet Row Pulmonary Rehab from 06/11/2022 in [facility] Cardiac and Pulmonary Rehab  Picture Your Plate Total Score on Admission 56      Picture Your Plate Scores: <62 Unhealthy dietary pattern with much room for improvement. 41-50 Dietary pattern unlikely to meet recommendations for good health and room for improvement. 51-60 More healthful dietary pattern, with some room for improvement.  >60 Healthy dietary pattern, although there may be some specific behaviors that could be improved.   Nutrition Goals Re-Evaluation:   Nutrition Goals Discharge (Final Nutrition Goals Re-Evaluation):   Psychosocial: Target Goals: Acknowledge presence or absence of significant depression and/or stress, maximize coping skills, provide positive support system. Participant is able to verbalize types and ability to use techniques and skills needed for reducing stress and depression.   Education: Stress, Anxiety, and Depression - Group verbal and visual presentation to define topics covered.  Reviews how  body is impacted by stress, anxiety, and depression.  Also discusses healthy ways to reduce stress and to treat/manage anxiety and depression.  Written material given at graduation.   Education: Sleep Hygiene -Provides group verbal and written instruction about how sleep can affect your health.  Define sleep hygiene, discuss sleep cycles and impact of sleep habits. Review good sleep hygiene tips.    Initial Review & Psychosocial Screening:  Initial Psych Review & Screening - 05/24/22 0921       Initial Review   Current issues with Current Stress Concerns    Source of Stress Concerns Chronic Illness;Unable to  participate in former interests or hobbies;Occupation    Comments She has been out of work for 4 months and has been frustrating. She has been having trouble sleepingand wants to gether health back to normal. She was working at [employer] in [place]. Her health and being on oxygen is one of of her big stress concerns. She is in pain alot and has to use a walker.      Family Dynamics   Good Support System? Yes      Barriers   Psychosocial barriers to participate in program The patient should benefit from training in stress management and relaxation.      Screening Interventions   Interventions Encouraged to exercise;To provide support and resources with identified psychosocial needs;Provide feedback about the scores to participant    Expected Outcomes Short Term goal: Utilizing psychosocial counselor, staff and physician to assist with identification of specific Stressors or current issues interfering with healing process. Setting desired goal for each stressor or current issue identified.;Long Term Goal: Stressors or current issues are controlled or eliminated.;Short Term goal: Identification and review with participant of any Quality of Life or Depression concerns found by scoring the questionnaire.;Long Term goal: The participant improves quality of Life and PHQ9 Scores as seen by post scores  and/or verbalization of changes             Quality of Life Scores:  Scores of 19 and below usually indicate a poorer quality of life in these areas.  A difference of  2-3 points is a clinically meaningful difference.  A difference of 2-3 points in the total score of the Quality of Life Index has been associated with significant improvement in overall quality of life, self-image, physical symptoms, and general health in studies assessing change in quality of life.  PHQ-9: Review Flowsheet       06/11/2022  Depression screen PHQ 2/9  Decreased Interest 0  Down, Depressed, Hopeless 1  PHQ - 2 Score 1  Altered sleeping 2  Tired, decreased energy 1  Change in appetite 1  Feeling bad or failure about yourself  0  Trouble concentrating 1  Moving slowly or fidgety/restless 1  Suicidal thoughts 0  PHQ-9 Score 7   Interpretation of Total Score  Total Score Depression Severity:  1-4 = Minimal depression, 5-9 = Mild depression, 10-14 = Moderate depression, 15-19 = Moderately severe depression, 20-27 = Severe depression   Psychosocial Evaluation and Intervention:  Psychosocial Evaluation - 05/24/22 0924       Psychosocial Evaluation & Interventions   Interventions Encouraged to exercise with the program and follow exercise prescription;Stress management education;Relaxation education    Comments She has been out of work for 4 months and has been frustrating. She has been having trouble sleepingand wants to gether health back to normal. She was working at [employer] in [place]. Her health and being on oxygen is one of of her big stress concerns. She is in pain alot and has to use a walker.    Expected Outcomes Short: Start [facility] to help with mood. Long: Maintain a healthy mental state.    Continue Psychosocial Services  Follow up required by staff             Psychosocial Re-Evaluation:   Psychosocial Discharge (Final Psychosocial Re-Evaluation):   Education: Education Goals:  Education classes will be provided on a weekly basis, covering required topics. Participant will state understanding/return demonstration of topics presented.  Learning Barriers/Preferences:  Learning Barriers/Preferences - 05/24/22 3729  Learning Barriers/Preferences   Learning Barriers None    Learning Preferences None             General Pulmonary Education Topics:  Infection Prevention: - Provides verbal and written material to individual with discussion of infection control including proper hand washing and proper equipment cleaning during exercise session. Flowsheet Row Pulmonary Rehab from 06/20/2022 in [facility] Cardiac and Pulmonary Rehab  Date 06/11/22  Educator [facility]  Instruction Review Code 1- Verbalizes Understanding       Falls Prevention: - Provides verbal and written material to individual with discussion of falls prevention and safety. Flowsheet Row Pulmonary Rehab from 06/20/2022 in [facility] Cardiac and Pulmonary Rehab  Date 06/11/22  Educator [facility]  Instruction Review Code 1- Verbalizes Understanding       Chronic Lung Disease Review: - Group verbal instruction with posters, models, PowerPoint presentations and videos,  to review new updates, new respiratory medications, new advancements in procedures and treatments. Providing information on websites and "800" numbers for continued self-education. Includes information about supplement oxygen, available portable oxygen systems, continuous and intermittent flow rates, oxygen safety, concentrators, and Medicare reimbursement for oxygen. Explanation of Pulmonary Drugs, including class, frequency, complications, importance of spacers, rinsing mouth after steroid MDI's, and proper cleaning methods for nebulizers. Review of basic lung anatomy and physiology related to function, structure, and complications of lung disease. Review of risk factors. Discussion about methods for diagnosing sleep apnea and types of masks and machines  for OSA. Includes a review of the use of types of environmental controls: home humidity, furnaces, filters, dust mite/pet prevention, HEPA vacuums. Discussion about weather changes, air quality and the benefits of nasal washing. Instruction on Warning signs, infection symptoms, calling MD promptly, preventive modes, and value of vaccinations. Review of effective airway clearance, coughing and/or vibration techniques. Emphasizing that all should Create an Action Plan. Written material given at graduation. Flowsheet Row Pulmonary Rehab from 06/20/2022 in [facility] Cardiac and Pulmonary Rehab  Education need identified 06/11/22       AED/CPR: - Group verbal and written instruction with the use of models to demonstrate the basic use of the AED with the basic ABC's of resuscitation.    Anatomy and Cardiac Procedures: - Group verbal and visual presentation and models provide information about basic cardiac anatomy and function. Reviews the testing methods done to diagnose heart disease and the outcomes of the test results. Describes the treatment choices: Medical Management, Angioplasty, or Coronary Bypass Surgery for treating various heart conditions including Myocardial Infarction, Angina, Valve Disease, and Cardiac Arrhythmias.  Written material given at graduation.   Medication Safety: - Group verbal and visual instruction to review commonly prescribed medications for heart and lung disease. Reviews the medication, class of the drug, and side effects. Includes the steps to properly store meds and maintain the prescription regimen.  Written material given at graduation.   Other: -Provides group and verbal instruction on various topics (see comments)   Knowledge Questionnaire Score:  Knowledge Questionnaire Score - 06/11/22 1545       Knowledge Questionnaire Score   Pre Score 15/18              Core Components/Risk Factors/Patient Goals at Admission:  Personal Goals and Risk Factors at  Admission - 06/11/22 1542       Core Components/Risk Factors/Patient Goals on Admission    Weight Management Yes    Intervention Weight Management: Develop a combined nutrition and exercise program designed to reach desired caloric intake, while maintaining appropriate  intake of nutrient and fiber, sodium and fats, and appropriate energy expenditure required for the weight goal.;Weight Management: Provide education and appropriate resources to help participant work on and attain dietary goals.;Weight Management/Obesity: Establish reasonable short term and long term weight goals.;Obesity: Provide education and appropriate resources to help participant work on and attain dietary goals.    Admit Weight 245 lb 6.4 oz (111.3 kg)    Goal Weight: Short Term 240 lb (108.9 kg)    Goal Weight: Long Term 220 lb (99.8 kg)    Expected Outcomes Short Term: Continue to assess and modify interventions until short term weight is achieved;Long Term: Adherence to nutrition and physical activity/exercise program aimed toward attainment of established weight goal;Weight Loss: Understanding of general recommendations for a balanced deficit meal plan, which promotes 1-2 lb weight loss per week and includes a negative energy balance of [id number] kcal/d;Understanding recommendations for meals to include 15-35% energy as protein, 25-35% energy from fat, 35-60% energy from carbohydrates, less than 282m of dietary cholesterol, 20-35 gm of total fiber daily;Understanding of distribution of calorie intake throughout the day with the consumption of 4-5 meals/snacks    Tobacco Cessation Yes    Number of packs per day 0    Intervention Assist the participant in steps to quit. Provide individualized education and counseling about committing to Tobacco Cessation, relapse prevention, and pharmacological support that can be provided by physician.;OAdvice worker assist with locating and accessing local/national Quit Smoking  programs, and support quit date choice.    Expected Outcomes Short Term: Will demonstrate readiness to quit, by selecting a quit date.;Short Term: Will quit all tobacco product use, adhering to prevention of relapse plan.;Long Term: Complete abstinence from all tobacco products for at least 12 months from quit date.    Improve shortness of breath with ADL's Yes    Intervention Provide education, individualized exercise plan and daily activity instruction to help decrease symptoms of SOB with activities of daily living.    Expected Outcomes Long Term: Be able to perform more ADLs without symptoms or delay the onset of symptoms;Short Term: Improve cardiorespiratory fitness to achieve a reduction of symptoms when performing ADLs    Hypertension Yes    Intervention Provide education on lifestyle modifcations including regular physical activity/exercise, weight management, moderate sodium restriction and increased consumption of fresh fruit, vegetables, and low fat dairy, alcohol moderation, and smoking cessation.;Monitor prescription use compliance.    Expected Outcomes Short Term: Continued assessment and intervention until BP is < 140/929mHG in hypertensive participants. < 130/809mG in hypertensive participants with diabetes, heart failure or chronic kidney disease.;Long Term: Maintenance of blood pressure at goal levels.    Lipids Yes    Intervention Provide education and support for participant on nutrition & aerobic/resistive exercise along with prescribed medications to achieve LDL <54m32mDL >40mg68m Expected Outcomes Short Term: Participant states understanding of desired cholesterol values and is compliant with medications prescribed. Participant is following exercise prescription and nutrition guidelines.;Long Term: Cholesterol controlled with medications as prescribed, with individualized exercise RX and with personalized nutrition plan. Value goals: LDL < 54mg,39m > 40 mg.              Education:Diabetes - Individual verbal and written instruction to review signs/symptoms of diabetes, desired ranges of glucose level fasting, after meals and with exercise. Acknowledge that pre and post exercise glucose checks will be done for 3 sessions at entry of program.   Know Your Numbers and Heart Failure: -  Group verbal and visual instruction to discuss disease risk factors for cardiac and pulmonary disease and treatment options.  Reviews associated critical values for Overweight/Obesity, Hypertension, Cholesterol, and Diabetes.  Discusses basics of heart failure: signs/symptoms and treatments.  Introduces Heart Failure Zone chart for action plan for heart failure.  Written material given at graduation.   Core Components/Risk Factors/Patient Goals Review:    Core Components/Risk Factors/Patient Goals at Discharge (Final Review):    ITP Comments:  ITP Comments     Row Name 05/24/22 0937 06/11/22 1528 06/11/22 1758 06/25/22 1513 06/27/22 0810   ITP Comments Virtual Visit completed. Patient informed on EP and RD appointment and 6 Minute walk test. Patient also informed of patient health questionnaires on My Chart. Patient Verbalizes understanding. Visit diagnosis can be found in [facility] 06/11/2022. Completed 6MWT and gym orientation. Initial ITP created and sent for review to Dr. Zetta Bills, Medical Director. Ebonee has recently quit tobacco use within the last 6 months. Intervention for relapse prevention was provided at the initial medical review. He was encouraged to continue to with tobacco cessation and was provided information on relapse prevention. Patient received information about combination therapy, tobacco cessation classes, quit line, and quit smoking apps in case of a relapse. Patient demonstrated understanding of this material.Staff will continue to provide encouragement and follow up with the patient throughout the program. Completed initial RD consultation 30 Day review  completed. Medical Director ITP review done, changes made as directed, and signed approval by Medical Director.            Comments:

## 2022-06-28 ENCOUNTER — Ambulatory Visit
Admission: EM | Admit: 2022-06-28 | Discharge: 2022-06-28 | Disposition: A | Discharge status: Home / Self Care | Payer: BC Managed Care – PPO

## 2022-06-28 DIAGNOSIS — M5431 Sciatica, right side: Secondary | ICD-10-CM

## 2022-06-28 MED ORDER — METHYLPREDNISOLONE 4 MG PO TBPK: ORAL_TABLET | ORAL | 0 refills | Status: DC

## 2022-06-28 MED ORDER — HYDROCODONE-ACETAMINOPHEN 5-325 MG PO TABS: 1.0000 | ORAL_TABLET | Freq: Four times a day (QID) | ORAL | 0 refills | Status: DC | PRN

## 2022-06-28 NOTE — Discharge Instructions (Signed)
Stop the current prednisone you are on, while on the Medrol dose pack

## 2022-06-28 NOTE — ED Provider Notes (Addendum)
[facility]    CSN: [id number] Arrival date & time: 06/28/22  0953      History   Chief Complaint Chief Complaint  Patient presents with   Hip Pain    HPI Kelly Trujillo is a 61 y.o. female who presents with R lower  back pain and radiates to glut and R thigh. Denies numbness or paresthesia. Pain is provoked with trying to walk and putting weight on her R leg. Has been doing pulmonary rehab 3 x /week and believes this did it. Her PCP called her on Mobic yesterday and has taken 2 doses and is not helping. Has hx of L5 disc disease per MRI in the past and has had this before.     Past Medical History:  Diagnosis Date   COPD (chronic obstructive pulmonary disease) (HCC)    Hypertension     There are no problems to display for this patient.   Past Surgical History:  Procedure Laterality Date   ABDOMINAL HYSTERECTOMY     CYST EXCISION  1980s   KNEE ARTHROSCOPY WITH MEDIAL MENISECTOMY Left 08/16/2016   Procedure: KNEE ARTHROSCOPY WITH SYNOVECTOMY, MEDIAL AND LATERAL MENISCUS TEAR, LOOSE BODY;  Surgeon: Kennedy Bucker, MD;  Location: [facility] ORS;  Service: Orthopedics;  Laterality: Left;   TYMPANOPLASTY      OB History   No obstetric history on file.      Home Medications    Prior to Admission medications   Medication Sig Start Date End Date Taking? Authorizing Provider  acetaminophen (TYLENOL) 325 MG tablet Take by mouth.   Yes [provider]  ADVAIR DISKUS 500-50 MCG/DOSE AEPB  09/12/18  Yes [provider]  albuterol (VENTOLIN HFA) 108 (90 Base) MCG/ACT inhaler Inhale into the lungs. 04/23/19  Yes [provider]  Calcium Carbonate-Vit D-Min (CALCIUM 1200 PO) Take 1 tablet by mouth every morning.   Yes [provider]  cyclobenzaprine (FLEXERIL) 5 MG tablet Take by mouth. 05/22/22  Yes [provider]  diclofenac sodium (VOLTAREN) 1 % GEL Apply 2 g topically 4 (four) times daily.   Yes [provider]   fluticasone-salmeterol (ADVAIR HFA) 230-21 MCG/ACT inhaler Inhale into the lungs. 03/21/22 03/21/23 Yes [provider]  folic acid (FOLVITE) 1 MG tablet Take 1 mg by mouth daily.   Yes [provider]  furosemide (LASIX) 40 MG tablet Take by mouth. 02/26/22  Yes [provider]  hydrochlorothiazide (HYDRODIURIL) 25 MG tablet Take 25 mg by mouth daily. 07/15/18  Yes [provider]  HYDROcodone-acetaminophen (NORCO/VICODIN) 5-325 MG tablet Take 1 tablet by mouth every 6 (six) hours as needed. 06/28/22  Yes Rodriguez-Southworth, Nettie Elm, PA-C  hydroxychloroquine (PLAQUENIL) 200 MG tablet Take by mouth. 04/04/22 04/04/23 Yes [provider]  ibuprofen (ADVIL,MOTRIN) 800 MG tablet Take 800 mg by mouth every 8 (eight) hours as needed.   Yes [provider]  magnesium oxide (MAG-OX) 400 MG tablet Take by mouth. 04/16/22 04/16/23 Yes [provider]  meloxicam (MOBIC) 15 MG tablet Take 15 mg by mouth daily. 06/26/22  Yes [provider]  methotrexate (RHEUMATREX) 2.5 MG tablet Take by mouth. 02/27/22  Yes [provider]  methylPREDNISolone (MEDROL DOSEPAK) 4 MG TBPK tablet Take as directed per pack 06/28/22  Yes Rodriguez-Southworth, Nettie Elm, PA-C  Multiple Vitamins-Minerals (MULTIVITAMIN WITH MINERALS) tablet Take 1 tablet by mouth daily.   Yes [provider]  omeprazole (PRILOSEC) 20 MG capsule Take 1 capsule by mouth every morning. 10/10/21 10/16/22 Yes [provider]  potassium chloride SA (KLOR-CON M) 20 MEQ tablet Take 1 tablet by mouth daily. 03/21/22 03/21/23 Yes [provider]  sulfaSALAzine (AZULFIDINE) 500 MG EC tablet Take by mouth. 04/04/22 04/04/23 Yes [provider]    Family History Family History  Problem Relation Age of Onset   Colon cancer Mother    Heart failure Father     Social History Social History   Tobacco Use   Smoking status: Former    Packs/day: 1.00    Years:  35.00    Total pack years: 35.00    Types: Cigarettes    Quit date: 01/19/2022    Years since quitting: 0.4   Smokeless tobacco: Never  Vaping Use   Vaping Use: Never used  Substance Use Topics   Alcohol use: Not Currently    Comment: rare   Drug use: No     Allergies   Shellfish allergy, Other, and Sulfa antibiotics   Review of Systems Review of Systems  Constitutional:  Negative for fever.  Gastrointestinal:  Negative for abdominal pain.  Genitourinary:  Negative for difficulty urinating.  Musculoskeletal:  Positive for arthralgias, back pain and gait problem.       Has arthralgia of R knee  Skin:  Negative for rash and wound.  Neurological:  Negative for weakness and numbness.     Physical Exam Triage Vital Signs ED Triage Vitals  Enc Vitals Group     BP 06/28/22 1014 108/70     Pulse Rate 06/28/22 1014 72     Resp 06/28/22 1014 18     Temp 06/28/22 1014 98 F (36.7 C)     Temp Source 06/28/22 1014 Oral     SpO2 06/28/22 1014 96 %     Weight --      Height 06/28/22 1007 5\' 6"  (1.676 m)     Head Circumference --      Peak Flow --      Pain Score 06/28/22 1006 10     Pain Loc --      Pain Edu? --      Excl. in GC? --    No data found.  Updated Vital Signs BP 108/70 (BP Location: Left Arm)   Pulse 72   Temp 98 F (36.7 C) (Oral)   Resp 18   Ht 5\' 6"  (1.676 m)   SpO2 96%   BMI 39.60 kg/m   Visual Acuity Right Eye Distance:   Left Eye Distance:   Bilateral Distance:    Right Eye Near:   Left Eye Near:    Bilateral Near:     Physical Exam Vitals and nursing note reviewed.  Constitutional:      Appearance: She is obese.     Comments: In moderate pain when she tried to get up for spine exam. She could not get on the exam table, was sitting on her walker seat.   HENT:     Right Ear: External ear normal.     Left Ear: External ear normal.  Eyes:     General: No scleral icterus.    Conjunctiva/sclera: Conjunctivae normal.  Pulmonary:      Comments: She is wearing oxygen Musculoskeletal:     Comments: R HIP- normal ROM with no pain BACK- has local tenderness on R SI and lateral glut muscle. Spine ROM is limited due to stiffness. R lateral flexion provokes pain on areas of pain. Anterior flexion was to 80 degrees with pain radiating to her thigh.   Skin:  General: Skin is warm and dry.     Findings: No bruising, erythema or rash.  Neurological:     Mental Status: She is alert and oriented to person, place, and time.     Motor: No weakness.     Gait: Gait abnormal.     Comments: Unable to test reflexes due to pt not able to get on exam table  Psychiatric:        Mood and Affect: Mood normal.        Behavior: Behavior normal.      UC Treatments / Results  Labs (all labs ordered are listed, but only abnormal results are displayed) Labs Reviewed - No data to display  EKG   Radiology No results found.  Procedures Procedures (including critical care time)  Medications Ordered in UC Medications - No data to display  Initial Impression / Assessment and Plan / UC Course  I have reviewed the triage vital signs and the nursing notes.  R sciatica  I placed her on Medrol dose pack and gave her a few Norco's as noted.  See instructions.    Final Clinical Impressions(s) / UC Diagnoses   Final diagnoses:  Right sided sciatica     Discharge Instructions      Stop the current prednisone you are on, while on the Medrol dose pack     ED Prescriptions     Medication Sig Dispense Auth. Provider   methylPREDNISolone (MEDROL DOSEPAK) 4 MG TBPK tablet Take as directed per pack 1 each Rodriguez-Southworth, Nettie Elm, PA-C   HYDROcodone-acetaminophen (NORCO/VICODIN) 5-325 MG tablet Take 1 tablet by mouth every 6 (six) hours as needed. 5 tablet Rodriguez-Southworth, Nettie Elm, PA-C      I have reviewed the PDMP during this encounter.   Garey Ham, PA-C 06/28/22 1041    Rodriguez-Southworth,  [place], PA-C 06/28/22 1042

## 2022-06-28 NOTE — ED Triage Notes (Signed)
Pt c/o Right side hip pain. Pt believes its Sciatic pain.  Pt has been doing pulmonary Rehab 3 days a week and did a full session recently wearing steel toe shoes. Pt has been unable to go to rehab due to hip pain.  Pt states she weights 148lb and weighs herself daily

## 2022-06-29 MED FILL — POTASSIUM CHLORIDE ER 20 MEQ TABLET,EXTENDED RELEASE(PART/CRYST): ORAL | 30 days supply | Qty: 30 | Fill #3

## 2022-07-02 ENCOUNTER — Encounter: Payer: BC Managed Care – PPO | Admitting: *Deleted

## 2022-07-02 DIAGNOSIS — J449 Chronic obstructive pulmonary disease, unspecified: Secondary | ICD-10-CM

## 2022-07-02 NOTE — Progress Notes (Signed)
Daily Session Note  Patient Details  Name: Kelly Trujillo MRN: [id number] Date of Birth: 08-18-1961 Referring Provider:   Flowsheet Row Pulmonary Rehab from 06/11/2022 in [facility] Cardiac and Pulmonary Rehab  Referring Provider Tai Wang       Encounter Date: 07/02/2022  Check In:  Session Check In - 07/02/22 1359       Check-In   Supervising physician immediately available to respond to emergencies See telemetry face sheet for immediately available ER MD    Location [facility]-Cardiac & Pulmonary Rehab    Staff Present Nyoka Cowden, RN, BSN, Ardeth Sportsman, RDN, Tawanna Solo, MS, ASCM CEP, Exercise Physiologist    Virtual Visit No    Medication changes reported     Yes    Comments on Meloxicam for sciatic pain    Fall or balance concerns reported    Yes    Comments Unsteady on feet due to back/r leg pain "sciatica"    Tobacco Cessation No Change    Warm-up and Cool-down Performed on first and last piece of equipment    Resistance Training Performed Yes    VAD Patient? No    PAD/SET Patient? No      Pain Assessment   Currently in Pain? Yes    Pain Score 9     Pain Location Leg    Pain Orientation Right    Pain Descriptors / Indicators Stabbing    Pain Type Acute pain    Pain Radiating Towards "acute exaberation of chronic sciatica"    Pain Onset In the past 7 days    Pain Frequency Constant    Aggravating Factors  Pt states she has recurring sciatica with this flare-up starting a week ago.    Pain Relieving Factors Went to Urgent Care over the weekend. Rx Meloxicam with minimal relief.    Effect of Pain on Daily Activities Walked in leaning heavily on walker    Multiple Pain Sites No                Social History   Tobacco Use  Smoking Status Former   Packs/day: 1.00   Years: 35.00   Total pack years: 35.00   Types: Cigarettes   Quit date: 01/19/2022   Years since quitting: 0.4  Smokeless Tobacco Never    Goals Met:  Independence with exercise  equipment Exercise tolerated well No report of concerns or symptoms today  Goals Unmet:  Not Applicable  Comments: Pt able to follow exercise prescription today without complaint.  Will continue to monitor for progression.    Dr. Emily Filbert is Medical Director for [place].  Dr. Ottie Glazier is Medical Director for [facility] Pulmonary Rehabilitation.

## 2022-07-03 ENCOUNTER — Ambulatory Visit
Admit: 2022-07-03 | Discharge: 2022-07-04 | Payer: PRIVATE HEALTH INSURANCE | Attending: Internal Medicine | Primary: Internal Medicine

## 2022-07-03 DIAGNOSIS — M0579 Rheumatoid arthritis with rheumatoid factor of multiple sites without organ or systems involvement: Principal | ICD-10-CM

## 2022-07-03 DIAGNOSIS — M79604 Pain in right leg: Principal | ICD-10-CM

## 2022-07-03 DIAGNOSIS — J449 Chronic obstructive pulmonary disease, unspecified: Principal | ICD-10-CM

## 2022-07-03 DIAGNOSIS — M545 Low back pain radiating to right leg: Principal | ICD-10-CM

## 2022-07-03 MED ORDER — OXYCODONE 5 MG TABLET
ORAL_TABLET | Freq: Two times a day (BID) | ORAL | 0 refills | 10 days | Status: CP | PRN
Start: 2022-07-03 — End: ?

## 2022-07-03 MED ORDER — FLUTICASONE PROPIONATE 230 MCG-SALMETEROL 21 MCG/ACTUATION HFA INHALER
Freq: Two times a day (BID) | RESPIRATORY_TRACT | 3 refills | 90 days | Status: CP
Start: 2022-07-03 — End: 2022-10-01

## 2022-07-05 ENCOUNTER — Encounter: Payer: BC Managed Care – PPO | Admitting: *Deleted

## 2022-07-05 DIAGNOSIS — J449 Chronic obstructive pulmonary disease, unspecified: Secondary | ICD-10-CM | POA: Diagnosis not present

## 2022-07-05 NOTE — Progress Notes (Signed)
Daily Session Note  Patient Details  Name: Kelly Trujillo MRN: [id number] Date of Birth: [birth year] Referring Provider:   Flowsheet Row Pulmonary Rehab from 06/11/2022 in [facility] Cardiac and Pulmonary Rehab  Referring Provider Tai Wang       Encounter Date: 07/05/2022  Check In:  Session Check In - 07/05/22 1333       Check-In   Supervising physician immediately available to respond to emergencies See telemetry face sheet for immediately available ER MD    Location [facility]-Cardiac & Pulmonary Rehab    Staff Present Alberteen Sam, MA, RCEP, CCRP, Marylynn Pearson, MS, ASCM CEP, Exercise Physiologist;Takina Busser Sherryll Burger, RN BSN    Virtual Visit No    Medication changes reported     No    Fall or balance concerns reported    No    Warm-up and Cool-down Performed on first and last piece of equipment    Resistance Training Performed Yes    VAD Patient? No    PAD/SET Patient? No      Pain Assessment   Currently in Pain? No/denies                Social History   Tobacco Use  Smoking Status Former   Packs/day: 1.00   Years: 35.00   Total pack years: 35.00   Types: Cigarettes   Quit date: 01/19/2022   Years since quitting: 0.4  Smokeless Tobacco Never    Goals Met:  Independence with exercise equipment Exercise tolerated well No report of concerns or symptoms today Strength training completed today  Goals Unmet:  Not Applicable  Comments: Pt able to follow exercise prescription today without complaint.  Will continue to monitor for progression.    Dr. Emily Filbert is Medical Director for [place].  Dr. Ottie Glazier is Medical Director for [facility] Pulmonary Rehabilitation.

## 2022-07-09 ENCOUNTER — Encounter: Payer: BC Managed Care – PPO | Admitting: *Deleted

## 2022-07-09 DIAGNOSIS — J449 Chronic obstructive pulmonary disease, unspecified: Secondary | ICD-10-CM

## 2022-07-09 NOTE — Progress Notes (Signed)
Daily Session Note  Patient Details  Name: Kelly Trujillo MRN: [id number] Date of Birth: February 05, 1961 Referring Provider:   Flowsheet Row Pulmonary Rehab from 06/11/2022 in [facility] Cardiac and Pulmonary Rehab  Referring Provider Tai Wang       Encounter Date: 07/09/2022  Check In:  Session Check In - 07/09/22 1350       Check-In   Supervising physician immediately available to respond to emergencies See telemetry face sheet for immediately available ER MD    Location [facility]-Cardiac & Pulmonary Rehab    Staff Present Nyoka Cowden, RN, BSN, MA;Meredith Sherryll Burger, RN BSN;Melissa Tilford Pillar, RDN, LDN    Virtual Visit No    Medication changes reported     No    Fall or balance concerns reported    No    Tobacco Cessation No Change    Warm-up and Cool-down Performed on first and last piece of equipment    Resistance Training Performed Yes    VAD Patient? No    PAD/SET Patient? No      Pain Assessment   Currently in Pain? No/denies                Social History   Tobacco Use  Smoking Status Former   Packs/day: 1.00   Years: 35.00   Total pack years: 35.00   Types: Cigarettes   Quit date: 01/19/2022   Years since quitting: 0.4  Smokeless Tobacco Never    Goals Met:  Independence with exercise equipment Exercise tolerated well No report of concerns or symptoms today  Goals Unmet:  Not Applicable  Comments: Pt able to follow exercise prescription today without complaint.  Will continue to monitor for progression.    Dr. Emily Filbert is Medical Director for [place].  Dr. Ottie Glazier is Medical Director for [facility] Pulmonary Rehabilitation.

## 2022-07-11 ENCOUNTER — Ambulatory Visit: Admit: 2022-07-11 | Discharge: 2022-07-11 | Payer: PRIVATE HEALTH INSURANCE

## 2022-07-11 DIAGNOSIS — I5082 Biventricular heart failure: Principal | ICD-10-CM

## 2022-07-11 DIAGNOSIS — I5032 Chronic diastolic (congestive) heart failure: Principal | ICD-10-CM

## 2022-07-12 ENCOUNTER — Encounter: Payer: BC Managed Care – PPO | Admitting: *Deleted

## 2022-07-12 DIAGNOSIS — J449 Chronic obstructive pulmonary disease, unspecified: Secondary | ICD-10-CM | POA: Diagnosis not present

## 2022-07-12 NOTE — Progress Notes (Signed)
Daily Session Note  Patient Details  Name: Kelly Trujillo MRN: [id number] Date of Birth: Mar 05, 1961 Referring Provider:   Flowsheet Row Pulmonary Rehab from 06/11/2022 in [facility] Cardiac and Pulmonary Rehab  Referring Provider Tai Wang       Encounter Date: 07/12/2022  Check In:  Session Check In - 07/12/22 1324       Check-In   Supervising physician immediately available to respond to emergencies See telemetry face sheet for immediately available ER MD    Location [facility]-Cardiac & Pulmonary Rehab    Staff Present Alberteen Sam, MA, RCEP, CCRP, CCET;Melissa [place], RDN, LDN;Theophilus Walz Sherryll Burger, RN BSN    Virtual Visit No    Medication changes reported     No    Fall or balance concerns reported    No    Warm-up and Cool-down Performed on first and last piece of equipment    Resistance Training Performed Yes    VAD Patient? No    PAD/SET Patient? No      Pain Assessment   Currently in Pain? No/denies                Social History   Tobacco Use  Smoking Status Former   Packs/day: 1.00   Years: 35.00   Total pack years: 35.00   Types: Cigarettes   Quit date: 01/19/2022   Years since quitting: 0.4  Smokeless Tobacco Never    Goals Met:  Independence with exercise equipment Exercise tolerated well No report of concerns or symptoms today Strength training completed today  Goals Unmet:  Not Applicable  Comments: Pt able to follow exercise prescription today without complaint.  Will continue to monitor for progression.    Dr. Emily Filbert is Medical Director for [place].  Dr. Ottie Glazier is Medical Director for [facility] Pulmonary Rehabilitation.

## 2022-07-13 DIAGNOSIS — J449 Chronic obstructive pulmonary disease, unspecified: Principal | ICD-10-CM

## 2022-07-14 ENCOUNTER — Emergency Department: Payer: BC Managed Care – PPO

## 2022-07-14 ENCOUNTER — Other Ambulatory Visit: Payer: Self-pay

## 2022-07-14 ENCOUNTER — Inpatient Hospital Stay: Payer: BC Managed Care – PPO

## 2022-07-14 ENCOUNTER — Inpatient Hospital Stay
Admission: EM | Admit: 2022-07-14 | Discharge: 2022-07-16 | DRG: 871 | Disposition: A | Discharge status: Home / Self Care | Payer: BC Managed Care – PPO | Attending: Internal Medicine | Admitting: Internal Medicine

## 2022-07-14 DIAGNOSIS — K769 Liver disease, unspecified: Secondary | ICD-10-CM | POA: Diagnosis present

## 2022-07-14 DIAGNOSIS — D7389 Other diseases of spleen: Secondary | ICD-10-CM | POA: Diagnosis present

## 2022-07-14 DIAGNOSIS — N10 Acute tubulo-interstitial nephritis: Secondary | ICD-10-CM | POA: Diagnosis present

## 2022-07-14 DIAGNOSIS — J9621 Acute and chronic respiratory failure with hypoxia: Secondary | ICD-10-CM | POA: Diagnosis present

## 2022-07-14 DIAGNOSIS — M069 Rheumatoid arthritis, unspecified: Secondary | ICD-10-CM | POA: Diagnosis present

## 2022-07-14 DIAGNOSIS — R7989 Other specified abnormal findings of blood chemistry: Secondary | ICD-10-CM | POA: Diagnosis present

## 2022-07-14 DIAGNOSIS — A419 Sepsis, unspecified organism: Secondary | ICD-10-CM | POA: Diagnosis not present

## 2022-07-14 DIAGNOSIS — Z20822 Contact with and (suspected) exposure to covid-19: Secondary | ICD-10-CM | POA: Diagnosis present

## 2022-07-14 DIAGNOSIS — Z91013 Allergy to seafood: Secondary | ICD-10-CM

## 2022-07-14 DIAGNOSIS — Z79899 Other long term (current) drug therapy: Secondary | ICD-10-CM | POA: Diagnosis not present

## 2022-07-14 DIAGNOSIS — J449 Chronic obstructive pulmonary disease, unspecified: Secondary | ICD-10-CM | POA: Diagnosis present

## 2022-07-14 DIAGNOSIS — Z882 Allergy status to sulfonamides status: Secondary | ICD-10-CM

## 2022-07-14 DIAGNOSIS — Z7951 Long term (current) use of inhaled steroids: Secondary | ICD-10-CM | POA: Diagnosis not present

## 2022-07-14 DIAGNOSIS — S2241XA Multiple fractures of ribs, right side, initial encounter for closed fracture: Secondary | ICD-10-CM | POA: Diagnosis present

## 2022-07-14 DIAGNOSIS — S2231XA Fracture of one rib, right side, initial encounter for closed fracture: Secondary | ICD-10-CM | POA: Diagnosis present

## 2022-07-14 DIAGNOSIS — Z79631 Long term (current) use of antimetabolite agent: Secondary | ICD-10-CM

## 2022-07-14 DIAGNOSIS — Z6841 Body Mass Index (BMI) 40.0 and over, adult: Secondary | ICD-10-CM | POA: Diagnosis not present

## 2022-07-14 DIAGNOSIS — Z8249 Family history of ischemic heart disease and other diseases of the circulatory system: Secondary | ICD-10-CM

## 2022-07-14 DIAGNOSIS — I110 Hypertensive heart disease with heart failure: Secondary | ICD-10-CM | POA: Diagnosis present

## 2022-07-14 DIAGNOSIS — I10 Essential (primary) hypertension: Secondary | ICD-10-CM | POA: Diagnosis present

## 2022-07-14 DIAGNOSIS — Z87891 Personal history of nicotine dependence: Secondary | ICD-10-CM

## 2022-07-14 DIAGNOSIS — E119 Type 2 diabetes mellitus without complications: Secondary | ICD-10-CM | POA: Diagnosis present

## 2022-07-14 DIAGNOSIS — I5A Non-ischemic myocardial injury (non-traumatic): Secondary | ICD-10-CM | POA: Diagnosis present

## 2022-07-14 DIAGNOSIS — J189 Pneumonia, unspecified organism: Secondary | ICD-10-CM | POA: Diagnosis present

## 2022-07-14 DIAGNOSIS — R6520 Severe sepsis without septic shock: Secondary | ICD-10-CM | POA: Diagnosis present

## 2022-07-14 DIAGNOSIS — N289 Disorder of kidney and ureter, unspecified: Secondary | ICD-10-CM

## 2022-07-14 DIAGNOSIS — X58XXXA Exposure to other specified factors, initial encounter: Secondary | ICD-10-CM | POA: Diagnosis present

## 2022-07-14 DIAGNOSIS — I5032 Chronic diastolic (congestive) heart failure: Secondary | ICD-10-CM | POA: Diagnosis present

## 2022-07-14 DIAGNOSIS — N3001 Acute cystitis with hematuria: Secondary | ICD-10-CM

## 2022-07-14 DIAGNOSIS — Z9981 Dependence on supplemental oxygen: Secondary | ICD-10-CM | POA: Diagnosis not present

## 2022-07-14 DIAGNOSIS — D739 Disease of spleen, unspecified: Secondary | ICD-10-CM | POA: Diagnosis present

## 2022-07-14 DIAGNOSIS — A4151 Sepsis due to Escherichia coli [E. coli]: Secondary | ICD-10-CM | POA: Diagnosis present

## 2022-07-14 LAB — BLOOD CULTURE ID PANEL (REFLEXED) - BCID2

## 2022-07-14 LAB — TROPONIN I (HIGH SENSITIVITY)
Troponin I (High Sensitivity): 19 ng/L — ABNORMAL HIGH (ref <18)
Troponin I (High Sensitivity): 18 ng/L — ABNORMAL HIGH (ref <18)

## 2022-07-14 LAB — COMPREHENSIVE METABOLIC PANEL
ALT: 56 U/L — ABNORMAL HIGH (ref 0–44)
AST: 50 U/L — ABNORMAL HIGH (ref 15–41)
Albumin: 3.1 g/dL — ABNORMAL LOW (ref 3.5–5.0)
Alkaline Phosphatase: 185 U/L — ABNORMAL HIGH (ref 38–126)
Anion gap: 8 (ref 5–15)
BUN: 25 mg/dL — ABNORMAL HIGH (ref 8–23)
CO2: 24 mmol/L (ref 22–32)
Calcium: 8.6 mg/dL — ABNORMAL LOW (ref 8.9–10.3)
Chloride: 106 mmol/L (ref 98–111)
Creatinine, Ser: 0.85 mg/dL (ref 0.44–1.00)
GFR, Estimated: >60 mL/min (ref >60)
Glucose, Bld: 180 mg/dL — ABNORMAL HIGH (ref 70–99)
Potassium: 4.2 mmol/L (ref 3.5–5.1)
Sodium: 138 mmol/L (ref 135–145)
Total Bilirubin: 1.2 mg/dL (ref 0.3–1.2)
Total Protein: 7 g/dL (ref 6.5–8.1)

## 2022-07-14 LAB — URINALYSIS, ROUTINE W REFLEX MICROSCOPIC
Bilirubin Urine: NEGATIVE
Glucose, UA: NEGATIVE mg/dL
Ketones, ur: NEGATIVE mg/dL
Nitrite: NEGATIVE
Protein, ur: 100 mg/dL — AB
Specific Gravity, Urine: 1.016 (ref 1.005–1.030)
WBC, UA: >50 WBC/hpf — ABNORMAL HIGH (ref 0–5)
pH: 6 (ref 5.0–8.0)

## 2022-07-14 LAB — CBC WITH DIFFERENTIAL/PLATELET
Abs Immature Granulocytes: 0.06 10*3/uL (ref 0.00–0.07)
Basophils Absolute: 0 10*3/uL (ref 0.0–0.1)
Basophils Relative: 0 %
Eosinophils Absolute: 0.1 10*3/uL (ref 0.0–0.5)
Eosinophils Relative: 1 %
HCT: 34.5 % — ABNORMAL LOW (ref 36.0–46.0)
Hemoglobin: 10.9 g/dL — ABNORMAL LOW (ref 12.0–15.0)
Immature Granulocytes: 1 %
Lymphocytes Relative: 7 %
Lymphs Abs: 0.8 10*3/uL (ref 0.7–4.0)
MCH: 31.7 pg (ref 26.0–34.0)
MCHC: 31.6 g/dL (ref 30.0–36.0)
MCV: 100.3 fL — ABNORMAL HIGH (ref 80.0–100.0)
Monocytes Absolute: 1.1 10*3/uL — ABNORMAL HIGH (ref 0.1–1.0)
Monocytes Relative: 9 %
Neutro Abs: 10 10*3/uL — ABNORMAL HIGH (ref 1.7–7.7)
Neutrophils Relative %: 82 %
Platelets: 252 10*3/uL (ref 150–400)
RBC: 3.44 MIL/uL — ABNORMAL LOW (ref 3.87–5.11)
RDW: 15.1 % (ref 11.5–15.5)
WBC: 12.1 10*3/uL — ABNORMAL HIGH (ref 4.0–10.5)
nRBC: 0 % (ref 0.0–0.2)

## 2022-07-14 LAB — HEMOGLOBIN A1C
Hgb A1c MFr Bld: 6.3 % — ABNORMAL HIGH (ref 4.8–5.6)
Mean Plasma Glucose: 134.11 mg/dL

## 2022-07-14 LAB — EXPECTORATED SPUTUM ASSESSMENT W GRAM STAIN, RFLX TO RESP C: Special Requests: NORMAL

## 2022-07-14 LAB — STREP PNEUMONIAE URINARY ANTIGEN: Strep Pneumo Urinary Antigen: NEGATIVE

## 2022-07-14 LAB — PROCALCITONIN: Procalcitonin: 1.01 ng/mL

## 2022-07-14 LAB — PROTIME-INR
INR: 1.1 (ref 0.8–1.2)
Prothrombin Time: 14.3 seconds (ref 11.4–15.2)

## 2022-07-14 LAB — APTT: aPTT: 29 seconds (ref 24–36)

## 2022-07-14 LAB — D-DIMER, QUANTITATIVE: D-Dimer, Quant: 3.24 ug/mL-FEU — ABNORMAL HIGH (ref 0.00–0.50)

## 2022-07-14 LAB — SARS CORONAVIRUS 2 BY RT PCR: SARS Coronavirus 2 by RT PCR: NEGATIVE

## 2022-07-14 LAB — LACTIC ACID, PLASMA: Lactic Acid, Venous: 0.8 mmol/L (ref 0.5–1.9)

## 2022-07-14 LAB — BRAIN NATRIURETIC PEPTIDE: B Natriuretic Peptide: 156.4 pg/mL — ABNORMAL HIGH (ref 0.0–100.0)

## 2022-07-14 MED ORDER — IPRATROPIUM-ALBUTEROL 0.5-2.5 (3) MG/3ML IN SOLN
3.0000 mL | Freq: Once | RESPIRATORY_TRACT | Status: AC
  Administered 2022-07-14: 3 mL via RESPIRATORY_TRACT
  Filled 2022-07-14: qty 3

## 2022-07-14 MED ORDER — SODIUM CHLORIDE 0.9 % IV SOLN
500.0000 mg | INTRAVENOUS | Status: DC
  Administered 2022-07-14 – 2022-07-15 (×2): 500 mg via INTRAVENOUS
  Filled 2022-07-14 (×3): qty 5

## 2022-07-14 MED ORDER — IPRATROPIUM-ALBUTEROL 0.5-2.5 (3) MG/3ML IN SOLN
3.0000 mL | Freq: Four times a day (QID) | RESPIRATORY_TRACT | Status: DC
  Administered 2022-07-15: 3 mL via RESPIRATORY_TRACT
  Filled 2022-07-14: qty 3

## 2022-07-14 MED ORDER — PREDNISONE 10 MG PO TABS
15.0000 mg | ORAL_TABLET | Freq: Every day | ORAL | Status: DC
  Administered 2022-07-14 – 2022-07-16 (×3): 15 mg via ORAL
  Filled 2022-07-14 (×3): qty 2

## 2022-07-14 MED ORDER — IBUPROFEN 400 MG PO TABS
200.0000 mg | ORAL_TABLET | Freq: Four times a day (QID) | ORAL | Status: DC | PRN
  Administered 2022-07-16: 200 mg via ORAL
  Filled 2022-07-14: qty 1

## 2022-07-14 MED ORDER — PANTOPRAZOLE SODIUM 40 MG PO TBEC
40.0000 mg | DELAYED_RELEASE_TABLET | Freq: Every day | ORAL | Status: DC
  Administered 2022-07-14 – 2022-07-16 (×3): 40 mg via ORAL
  Filled 2022-07-14 (×3): qty 1

## 2022-07-14 MED ORDER — OXYCODONE-ACETAMINOPHEN 5-325 MG PO TABS: 1.0000 | ORAL_TABLET | ORAL | Status: DC | PRN

## 2022-07-14 MED ORDER — CYCLOBENZAPRINE HCL 10 MG PO TABS
5.0000 mg | ORAL_TABLET | Freq: Three times a day (TID) | ORAL | Status: DC | PRN
Start: 2022-07-14 — End: 2022-07-16

## 2022-07-14 MED ORDER — ONDANSETRON HCL 4 MG/2ML IJ SOLN: 4.0000 mg | Freq: Three times a day (TID) | INTRAMUSCULAR | Status: DC | PRN

## 2022-07-14 MED ORDER — IPRATROPIUM-ALBUTEROL 0.5-2.5 (3) MG/3ML IN SOLN
3.0000 mL | RESPIRATORY_TRACT | Status: DC
  Administered 2022-07-14 (×2): 3 mL via RESPIRATORY_TRACT
  Filled 2022-07-14 (×2): qty 3

## 2022-07-14 MED ORDER — ADULT MULTIVITAMIN W/MINERALS CH
1.0000 | ORAL_TABLET | Freq: Every day | ORAL | Status: DC
  Administered 2022-07-14 – 2022-07-16 (×3): 1 via ORAL
  Filled 2022-07-14 (×3): qty 1

## 2022-07-14 MED ORDER — HYDRALAZINE HCL 20 MG/ML IJ SOLN
5.0000 mg | INTRAMUSCULAR | Status: DC | PRN
Start: 2022-07-14 — End: 2022-07-16

## 2022-07-14 MED ORDER — OXYCODONE HCL 5 MG PO TABS
5.0000 mg | ORAL_TABLET | Freq: Four times a day (QID) | ORAL | Status: DC | PRN
  Administered 2022-07-15: 5 mg via ORAL
  Filled 2022-07-14: qty 1

## 2022-07-14 MED ORDER — MORPHINE SULFATE (PF) 2 MG/ML IV SOLN: 2.0000 mg | INTRAVENOUS | Status: DC | PRN

## 2022-07-14 MED ORDER — ASCORBIC ACID 500 MG PO TABS
500.0000 mg | ORAL_TABLET | Freq: Every day | ORAL | Status: DC
  Administered 2022-07-14 – 2022-07-16 (×3): 500 mg via ORAL
  Filled 2022-07-14 (×3): qty 1

## 2022-07-14 MED ORDER — SULFASALAZINE 500 MG PO TBEC
500.0000 mg | DELAYED_RELEASE_TABLET | Freq: Two times a day (BID) | ORAL | Status: DC
  Administered 2022-07-14 – 2022-07-16 (×5): 500 mg via ORAL
  Filled 2022-07-14 (×5): qty 1

## 2022-07-14 MED ORDER — METHOTREXATE 2.5 MG PO TABS
20.0000 mg | ORAL_TABLET | ORAL | Status: DC
  Administered 2022-07-16: 20 mg via ORAL
  Filled 2022-07-14: qty 8

## 2022-07-14 MED ORDER — VITAMIN D 25 MCG (1000 UNIT) PO TABS
1000.0000 [IU] | ORAL_TABLET | Freq: Every day | ORAL | Status: DC
  Administered 2022-07-14 – 2022-07-16 (×3): 1000 [IU] via ORAL
  Filled 2022-07-14 (×3): qty 1

## 2022-07-14 MED ORDER — MOMETASONE FURO-FORMOTEROL FUM 200-5 MCG/ACT IN AERO
2.0000 | INHALATION_SPRAY | Freq: Two times a day (BID) | RESPIRATORY_TRACT | Status: DC
  Administered 2022-07-14 – 2022-07-16 (×4): 2 via RESPIRATORY_TRACT
  Filled 2022-07-14: qty 8.8

## 2022-07-14 MED ORDER — LACTATED RINGERS IV BOLUS
1000.0000 mL | Freq: Once | INTRAVENOUS | Status: AC
  Administered 2022-07-14: 1000 mL via INTRAVENOUS

## 2022-07-14 MED ORDER — HYDROXYCHLOROQUINE SULFATE 200 MG PO TABS
200.0000 mg | ORAL_TABLET | Freq: Two times a day (BID) | ORAL | Status: DC
  Administered 2022-07-14 – 2022-07-16 (×5): 200 mg via ORAL
  Filled 2022-07-14 (×5): qty 1

## 2022-07-14 MED ORDER — MAGNESIUM OXIDE 400 MG PO TABS
400.0000 mg | ORAL_TABLET | Freq: Every day | ORAL | Status: DC
  Administered 2022-07-14 – 2022-07-16 (×3): 400 mg via ORAL
  Filled 2022-07-14 (×6): qty 1

## 2022-07-14 MED ORDER — ALBUTEROL SULFATE (2.5 MG/3ML) 0.083% IN NEBU: 2.5000 mg | INHALATION_SOLUTION | RESPIRATORY_TRACT | Status: DC | PRN

## 2022-07-14 MED ORDER — SODIUM CHLORIDE 0.9 % IV SOLN: INTRAVENOUS | Status: DC

## 2022-07-14 MED ORDER — FOLIC ACID 1 MG PO TABS
1.0000 mg | ORAL_TABLET | Freq: Every day | ORAL | Status: DC
  Administered 2022-07-14 – 2022-07-16 (×3): 1 mg via ORAL
  Filled 2022-07-14 (×3): qty 1

## 2022-07-14 MED ORDER — DM-GUAIFENESIN ER 30-600 MG PO TB12: 1.0000 | ORAL_TABLET | Freq: Two times a day (BID) | ORAL | Status: DC | PRN

## 2022-07-14 MED ORDER — PREDNISONE 10 MG PO TABS: 5.0000 mg | ORAL_TABLET | Freq: Every day | ORAL | Status: DC

## 2022-07-14 MED ORDER — SODIUM CHLORIDE 0.9 % IV SOLN
2.0000 g | INTRAVENOUS | Status: DC
  Administered 2022-07-15 – 2022-07-16 (×2): 2 g via INTRAVENOUS
  Filled 2022-07-14 (×2): qty 2

## 2022-07-14 MED ORDER — IOHEXOL 350 MG/ML SOLN
100.0000 mL | Freq: Once | INTRAVENOUS | Status: AC | PRN
  Administered 2022-07-14: 100 mL via INTRAVENOUS

## 2022-07-14 MED ORDER — SODIUM CHLORIDE 0.9 % IV SOLN
2.0000 g | Freq: Once | INTRAVENOUS | Status: AC
  Administered 2022-07-14: 2 g via INTRAVENOUS
  Filled 2022-07-14: qty 20

## 2022-07-14 NOTE — Assessment & Plan Note (Signed)
No wheezing on auscultation - Bronchodilators 

## 2022-07-14 NOTE — Assessment & Plan Note (Signed)
Sepsis due to acute pyelonephritis: Patient meets criteria for sepsis with WBC 12.1, heart rate of 102, lactic acid is normal at 0.8.  Currently hemodynamically stable.  -will admit to tele bed as inpt -IV Rocephin -Follow-up blood culture and urine culture -Check procalcitonin level --> <0.10 -IV fluid: 1 L LR, followed by 75 cc/h

## 2022-07-14 NOTE — Assessment & Plan Note (Signed)
CTA showed a 2 cm indeterminate lesion extending off of the RIGHT UPPER kidney. Further evaluation with elective MRI is recommended. -I recommended patient to follow-up with PCP for outpatient image

## 2022-07-14 NOTE — Assessment & Plan Note (Signed)
CTA showed a 2 x 3 x 4 cm wedge-shaped area of decreased attenuation along the INFERIOR spleen, which may represent a parenchymal abnormality (infection/infarct) versus a focal subcapsular collection. Unclear etiology. -I discussed with patient about these findings.  Recommend patient to follow-up with his PCP as outpatient to repeat CT scan.  She agreed to do so

## 2022-07-14 NOTE — Progress Notes (Signed)
Called RT for scheduled breathing treatment and Bipap setup as requested by patient. RT still at a different patient in another area. Told this RN that she'll come once she's available and done with her ongoing treatment. Pt aware and willing to wait.

## 2022-07-14 NOTE — Assessment & Plan Note (Signed)
Myocardial injury due to sepsis and ongoing infection. Troponin 18 --> 19 -Check A1c, FLP -Will not give aspirin due to abnormal findings on spleen by CTA (not sure if it is hematoma not)

## 2022-07-14 NOTE — ED Provider Notes (Signed)
[facility] Provider Note    Event Date/Time   First MD Initiated Contact with Patient 07/14/22 [phone]     (approximate)   History   Shortness of Breath   HPI  Kelly Trujillo is a 61 y.o. female with a past medical history of COPD and remote tobacco abuse chronically on 2 L nasal cannula during the day and BiPAP at night, HTN, DM, sciatica on as needed oxycodone and prednisone, CHF and as needed Lasix for elevated weight gain, rheumatoid arthritis on methotrexate and hydroxychloroquine who presents via EMS from home for evaluation of several symptoms including body aches, chills, subjective fever, low oxygen level and some urinary frequency and urinary incontinence last 3 days.  Patient states she saw her SPO2 was 85% pulse ox at home increase her oxygen to 3 L this morning.  She denies any new shortness of breath, cough, chest pain, headache, earache, sore throat, abdominal pain, back pain, rash, nausea, vomiting, or diarrhea.  No change in her chronic back pain.  She states she think she may have a UTI or pneumonia.  No recent EtOH use illicit drug use or tobacco abuse.  She does not take any Lasix today.  She is not on any blood thinners.  She has no other acute concerns at this time.    Past Medical History:  Diagnosis Date   COPD (chronic obstructive pulmonary disease) ([place])    Hypertension      Physical Exam  Triage Vital Signs: ED Triage Vitals  Enc Vitals Group     BP 07/14/22 0633 (!) 105/51     Pulse Rate 07/14/22 0633 (!) 102     Resp 07/14/22 0633 18     Temp 07/14/22 0633 99.5 F (37.5 C)     Temp Source 07/14/22 0633 Oral     SpO2 07/14/22 0633 (!) 87 %     Weight 07/14/22 0637 261 lb (118.4 kg)     Height 07/14/22 0637 5' 6"  (1.676 m)     Head Circumference --      Peak Flow --      Pain Score 07/14/22 0637 0     Pain Loc --      Pain Edu? --      Excl. in [place]? --     Most recent vital signs: Vitals:   07/14/22 0800 07/14/22 0900   BP: 118/61 118/66  Pulse: 81 79  Resp:  19  Temp: 99.3 F (37.4 C)   SpO2: 95% 96%    General: Awake, no distress.  CV:  Good peripheral perfusion.  2+ radial pulses.  Slightly tachycardic. Resp:  Normal effort.  Clear bilaterally. Abd:  No distention.  Mild tenderness in the bilateral lower quadrants and suprapubic region.  Otherwise soft throughout.  No CVA tenderness. Other:  Patient awake alert and oriented x3.  Minimal symmetric lower extremity edema.  ED Results / Procedures / Treatments  Labs (all labs ordered are listed, but only abnormal results are displayed) Labs Reviewed  CBC WITH DIFFERENTIAL/PLATELET - Abnormal; Notable for the following components:      Result Value   WBC 12.1 (*)    RBC 3.44 (*)    Hemoglobin 10.9 (*)    HCT 34.5 (*)    MCV 100.3 (*)    Neutro Abs 10.0 (*)    Monocytes Absolute 1.1 (*)    All other components within normal limits  COMPREHENSIVE METABOLIC PANEL - Abnormal; Notable for the following components:  Glucose, Bld 180 (*)    BUN 25 (*)    Calcium 8.6 (*)    Albumin 3.1 (*)    AST 50 (*)    ALT 56 (*)    Alkaline Phosphatase 185 (*)    All other components within normal limits  URINALYSIS, ROUTINE W REFLEX MICROSCOPIC - Abnormal; Notable for the following components:   Color, Urine AMBER (*)    APPearance CLOUDY (*)    Hgb urine dipstick SMALL (*)    Protein, ur 100 (*)    Leukocytes,Ua LARGE (*)    WBC, UA >50 (*)    Bacteria, UA RARE (*)    All other components within normal limits  D-DIMER, QUANTITATIVE - Abnormal; Notable for the following components:   D-Dimer, Quant 3.24 (*)    All other components within normal limits  BRAIN NATRIURETIC PEPTIDE - Abnormal; Notable for the following components:   B Natriuretic Peptide 156.4 (*)    All other components within normal limits  TROPONIN I (HIGH SENSITIVITY) - Abnormal; Notable for the following components:   Troponin I (High Sensitivity) 19 (*)    All other components  within normal limits  TROPONIN I (HIGH SENSITIVITY) - Abnormal; Notable for the following components:   Troponin I (High Sensitivity) 18 (*)    All other components within normal limits  CULTURE, BLOOD (ROUTINE X 2)  CULTURE, BLOOD (ROUTINE X 2)  URINE CULTURE  SARS CORONAVIRUS 2 BY RT PCR  PROCALCITONIN  LACTIC ACID, PLASMA  PROTIME-INR  APTT     EKG  EKG is remarkable sinus tachycardia with ventricular rate of 100, with active fascicle block, left axis deviation, without clear evidence of acute ischemia or other significant arrhythmia.   RADIOLOGY  Chest x-ray my interpretation shows some opacities at the left lung base possibly representing early developing pneumonia as well as appears to be some congestion of bilateral perihilar regions without evidence of other clear focal consolidation, large effusion or pneumothorax or other clear acute process.  I reviewed radiology dictation and agree with their findings.  CT chest abdomen pelvis on my interpretation without evidence of a large PE, clear pneumonia, diverticulitis, pancreatitis or kidney stone although does appear to be some stranding around the left kidney.  I reviewed radiology interpretation and agree to findings of an ill-defined opacity in left lower lobe favored to represent some atelectasis less likely pneumonia as well as a wedge-shaped area of possible infarct in the spleen versus a subcapsular collection and a 3.2 cm indeterminate lesion extending off the right upper kidney,, perinephric stranding on the left kidney consistent with pyelonephritis, evidence of pulmonary artery hypertension and CAD as well as what appears to be some acute to subacute right nondisplaced sixth and seventh rib fractures as well as cardiomegaly and aortic atherosclerosis.   PROCEDURES:  Critical Care performed: Yes, see critical care procedure note(s)  .Critical Care  Performed by: Lucrezia Starch, MD Authorized by: Lucrezia Starch, MD    Critical care provider statement:    Critical care time (minutes):  30   Critical care was necessary to treat or prevent imminent or life-threatening deterioration of the following conditions:  Sepsis and respiratory failure   Critical care was time spent personally by me on the following activities:  Development of treatment plan with patient or surrogate, discussions with consultants, evaluation of patient's response to treatment, examination of patient, ordering and review of laboratory studies, ordering and review of radiographic studies, ordering and performing treatments and  interventions, pulse oximetry, re-evaluation of patient's condition and review of old [place] ED: Medications  ipratropium-albuterol (DUONEB) 0.5-2.5 (3) MG/3ML nebulizer solution 3 mL (3 mLs Nebulization Given 07/14/22 0741)  cefTRIAXone (ROCEPHIN) 2 g in sodium chloride 0.9 % 100 mL IVPB (0 g Intravenous Stopped 07/14/22 0811)  lactated ringers bolus 1,000 mL (1,000 mLs Intravenous New Bag/Given 07/14/22 0750)  iohexol (OMNIPAQUE) 350 MG/ML injection 100 mL (100 mLs Intravenous Contrast Given 07/14/22 0813)     IMPRESSION / MDM / ASSESSMENT AND PLAN / ED COURSE  I reviewed the triage vital signs and the nursing notes. Patient's presentation is most consistent with acute presentation with potential threat to life or bodily function.                               Differential diagnosis includes, but is not limited to pneumonia, COPD exacerbation, bronchitis, ACS, arrhythmia, PE, cystitis, diverticulitis, metabolic derangements.  She has no chest pain or back pain to suggest a dissection or other historical exam features to suggest a stroke at this time.  She is borderline hypotensive on arrival and tachycardic and somewhat concerned about sepsis will order blood cultures and lactic acid as well.  I did attempt to wean patient back to 2 L nasal cannula on my initial evaluation the patient's  SPO2 decreased to 91%.  Improved back to 94% increasing back to 3 L nasal cannula.  Chest x-ray my interpretation shows some opacities at the left lung base possibly representing early developing pneumonia as well as appears to be some congestion of bilateral perihilar regions without evidence of other clear focal consolidation, large effusion or pneumothorax or other clear acute process.  I reviewed radiology dictation and agree with their findings.  CT chest abdomen pelvis on my interpretation without evidence of a large PE, clear pneumonia, diverticulitis, pancreatitis or kidney stone although does appear to be some stranding around the left kidney.  I reviewed radiology interpretation and agree to findings of an ill-defined opacity in left lower lobe favored to represent some atelectasis less likely pneumonia as well as a wedge-shaped area of possible infarct in the spleen versus a subcapsular collection and a 3.2 cm indeterminate lesion extending off the right upper kidney,, perinephric stranding on the left kidney consistent with pyelonephritis, evidence of pulmonary artery hypertension and CAD as well as what appears to be some acute to subacute right nondisplaced sixth and seventh rib fractures as well as cardiomegaly and aortic atherosclerosis.  CBC shows WC count of 12.1, hemoglobin of 10.9 and normal platelets.  CMP is remarkable for glucose of 180, very mild transaminitis with an AST of 50, ALT of 56 and alk phos of 182 with normal bilirubin but no other significant electrolyte or metabolic derangements.  Initial lactic acid is nonelevated 0.8.  Troponin slightly elevated at 19 I suspect presenting some demand ischemia with lower suspicion for occlusion MI at this time.  INR 1.1.  PTT 29.  D-dimer 3.24.  UA concerning for cystitis large leukocyte esterase, greater than 50 WBCs and some bacteria noted.  There are also some RBCs noted.  There is also evidence of some protein.  BNP slightly elevated  156.  Procalcitonin is 1.01.  On further questioning the patient after she found out from her fracture she denies any falls or injuries although she has been sore over the right chest the past week and after she had  a chiropractor perform some manipulations on her chest.  She is mildly tender here.  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  Given his acute on chronic failure with evidence of sepsis I suspect likely from a sending with a urinary tract infection admit to medicine service.      FINAL CLINICAL IMPRESSION(S) / ED DIAGNOSES   Final diagnoses:  Sepsis, due to unspecified organism, unspecified whether acute organ dysfunction present ([place])  Acute on chronic respiratory failure with hypoxia ([place])  Acute cystitis with hematuria  Positive D dimer  Closed fracture of multiple ribs of right side, initial encounter     Rx / DC Orders   ED Discharge Orders     None        Note:  This document was prepared using Dragon voice recognition software and may include unintentional dictation errors.   Lucrezia Starch, MD 07/14/22 [phone]

## 2022-07-14 NOTE — Consult Note (Signed)
CODE SEPSIS - PHARMACY COMMUNICATION  **Broad Spectrum Antibiotics should be administered within 1 hour of Sepsis diagnosis**  Time Code Sepsis Called/Page Received: 0800  Antibiotics Ordered: rocephin  Time of 1st antibiotic administration: 0741  Additional action taken by pharmacy: none  If necessary, Name of Provider/Nurse Contacted: n/a    Bettey Costa ,PharmD Clinical Pharmacist  07/14/2022  8:09 AM

## 2022-07-14 NOTE — ED Notes (Signed)
This RN requested transport of PCT per charge RN, Erskine Squibb.

## 2022-07-14 NOTE — ED Notes (Signed)
PCT to bedside to transport pt. [place] is at beside for exam.

## 2022-07-14 NOTE — Assessment & Plan Note (Signed)
-   Continue home methotrexate, Plaquenil, sulfasalazine -Increase prednisone dose from 5 to 15 mg daily

## 2022-07-14 NOTE — H&P (Signed)
History and Physical    Kelly Trujillo WUX:[id number] DOB: 24-Sep-1961 DOA: 07/14/2022  Referring MD/NP/PA:   PCP: Patterson Hammersmith, MD   Patient coming from:  The patient is coming from home.  At baseline, pt is independent for most of ADL.        Chief Complaint: fever, chills, urinary frequency and burning on urination,   HPI: Kelly Trujillo is a 61 y.o. female with medical history significant of COPD and remote tobacco abuse chronically on 2 L nasal cannula during the day and BiPAP at night, HTN, sciatica, dCHF, rheumatoid arthritis on methotrexate and hydroxychloroquine, who presents with fever, chills, and urinary frequency.  Patient states that she has not been feeling well for 3 days.  She has fever, chills, body aches.  She has increased urinary frequency and burning on urination, no dysuria or hematuria. Patient states she saw her oxygen desaturation down to 85% on her home level of 2 L oxygen. She has to increase her oxygen to 3 L this AM.  She denies chest pain and shortness of breath and cough.  No nausea, vomiting, diarrhea or abdominal pain.  No symptoms of UTI. She reports right sided rib cage pain, which is constant, mild, aching, nonradiating.  She states that rib cage pain started after she saw a chiropractor last week who pushed her back on the table for treating her back pain.   Data reviewed independently and ED Course: pt was found to have WBC 12.1, lactic acid 0.8, INR 1.1, PTT 29, positive urinalysis (cloudy appearance, large amount of leukocyte, rare bacteria, WBC> 50), BMP 156, troponin level 18, 19, liver function (ALP 185, AST 50, ALT 56, total bili 1.2), D-dimer 3.24, temperature 99.3, blood pressure 118/86, heart rate 102, RR 19, oxygen saturation 93% on 3 L oxygen.  Chest x-ray showed possible left basilar infiltration.  Patient is admitted to telemetry bed as inpatient.   CTA of chest and CT of abd/pelvis:  1. Ill-defined areas of decreased attenuation within  the LEFT LOWER renal pole with moderate adjacent perinephric inflammation likely representing infection/pyelonephritis. Slightly more hypodense areas within/along the LOWER LEFT kidney are indeterminate but may represent phlegmon/developing abscess.  2. 2 x 3 x 4 cm wedge-shaped area of decreased attenuation along the INFERIOR spleen, which may represent a parenchymal abnormality (infection/infarct) versus a focal subcapsular collection.  3. 2 cm indeterminate lesion extending off of the RIGHT UPPER kidney. Further evaluation with elective MRI is recommended.  4. Linear focal opacity at the LEFT lung base, slightly favor atelectasis over airspace disease/pneumonia.  5. Nondisplaced fractures of the anterior RIGHT 6th and 7th ribs, which appears acute to subacute. No evidence of pleural effusion or pneumothorax.  6. No evidence of pulmonary emboli. Enlarged central pulmonary arteries compatible with pulmonary arterial hypertension.  7. At least 4 very small (3-4 mm) hypodense lesions within the posterior RIGHT liver are noted, too small to characterize.  8. 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 9. Cardiomegaly and coronary artery disease. 10. Aortic Atherosclerosis (ICD10-I70.0).     EKG: I have personally reviewed.  Sinus rhythm, QTc 469, left atrial enlargement, left axis deviation   Review of Systems:   General: has fevers, chills, no body weight gain, has fatigue HEENT: no blurry vision, hearing changes or sore throat Respiratory: no dyspnea, coughing, wheezing CV: no chest pain, no palpitations GI: no nausea, vomiting, abdominal pain, diarrhea, constipation GU: no dysuria, has burning on urination, increased urinary frequency, no hematuria  Ext: has trace leg edema Neuro: no unilateral weakness, numbness, or tingling, no vision change or hearing loss Skin: no rash, no skin tear. MSK: has right rib cage pain Heme: No easy bruising.  Travel  history: No recent long distant travel.   Allergy:  Allergies  Allergen Reactions   Shellfish Allergy Swelling and Other (See Comments)    Swelling of lips, no swelling of tongue or difficulty breathing. Unknown Unknown Swelling of lips, no swelling of tongue or difficulty breathing.  Unknown   Other Other (See Comments)   Sulfa Antibiotics Nausea Only    Past Medical History:  Diagnosis Date   COPD (chronic obstructive pulmonary disease) (HCC)    Hypertension     Past Surgical History:  Procedure Laterality Date   ABDOMINAL HYSTERECTOMY     CYST EXCISION  1980s   KNEE ARTHROSCOPY WITH MEDIAL MENISECTOMY Left 08/16/2016   Procedure: KNEE ARTHROSCOPY WITH SYNOVECTOMY, MEDIAL AND LATERAL MENISCUS TEAR, LOOSE BODY;  Surgeon: Kennedy Bucker, MD;  Location: [facility] ORS;  Service: Orthopedics;  Laterality: Left;   TYMPANOPLASTY      Social History:  reports that she quit smoking about 5 months ago. Her smoking use included cigarettes. She has a 35.00 pack-year smoking history. She has never used smokeless tobacco. She reports that she does not currently use alcohol. She reports that she does not use drugs.  Family History:  Family History  Problem Relation Age of Onset   Colon cancer Mother    Heart failure Father      Prior to Admission medications   Medication Sig Start Date End Date Taking? Authorizing Provider  acetaminophen (TYLENOL) 325 MG tablet Take by mouth.    [provider]  ADVAIR DISKUS 500-50 MCG/DOSE AEPB  09/12/18   [provider]  albuterol (VENTOLIN HFA) 108 (90 Base) MCG/ACT inhaler Inhale into the lungs. 04/23/19   [provider]  Calcium Carbonate-Vit D-Min (CALCIUM 1200 PO) Take 1 tablet by mouth every morning.    [provider]  cyclobenzaprine (FLEXERIL) 5 MG tablet Take by mouth. 05/22/22   [provider]  diclofenac sodium (VOLTAREN) 1 % GEL Apply 2 g topically 4 (four) times daily.    [provider]   fluticasone-salmeterol (ADVAIR HFA) 230-21 MCG/ACT inhaler Inhale into the lungs. 03/21/22 03/21/23  [provider]  folic acid (FOLVITE) 1 MG tablet Take 1 mg by mouth daily.    [provider]  furosemide (LASIX) 40 MG tablet Take by mouth. 02/26/22   [provider]  hydrochlorothiazide (HYDRODIURIL) 25 MG tablet Take 25 mg by mouth daily. 07/15/18   [provider]  HYDROcodone-acetaminophen (NORCO/VICODIN) 5-325 MG tablet Take 1 tablet by mouth every 6 (six) hours as needed. 06/28/22   Rodriguez-Southworth, Nettie Elm, PA-C  hydroxychloroquine (PLAQUENIL) 200 MG tablet Take by mouth. 04/04/22 04/04/23  [provider]  ibuprofen (ADVIL,MOTRIN) 800 MG tablet Take 800 mg by mouth every 8 (eight) hours as needed.    [provider]  magnesium oxide (MAG-OX) 400 MG tablet Take by mouth. 04/16/22 04/16/23  [provider]  meloxicam (MOBIC) 15 MG tablet Take 15 mg by mouth daily. 06/26/22   [provider]  methotrexate (RHEUMATREX) 2.5 MG tablet Take by mouth. 02/27/22   [provider]  methylPREDNISolone (MEDROL DOSEPAK) 4 MG TBPK tablet Take as directed per pack 06/28/22   Rodriguez-Southworth, Nettie Elm, PA-C  Multiple Vitamins-Minerals (MULTIVITAMIN WITH MINERALS) tablet Take 1 tablet by mouth daily.    [provider]  omeprazole (PRILOSEC) 20 MG capsule Take 1 capsule by mouth every morning. 10/10/21 10/16/22  [provider]  potassium chloride SA (KLOR-CON M) 20 MEQ tablet Take 1 tablet by mouth daily. 03/21/22 03/21/23  [provider]  sulfaSALAzine (AZULFIDINE) 500 MG EC tablet Take by mouth. 04/04/22 04/04/23  [provider]    Physical Exam: Vitals:   07/15/22 2703 07/15/22 0500 07/15/22 0759 07/15/22 0812  BP: (!) 114/52   120/61  Pulse: 71   72  Resp: 18   20  Temp:    97.8 F (36.6 C)  TempSrc:      SpO2: 97%  96% 96%  Weight:  118.2 kg    Height:       General: Not in acute  distress HEENT:       Eyes: PERRL, EOMI, no scleral icterus.       ENT: No discharge from the ears and nose, no pharynx injection, no tonsillar enlargement.        Neck: No JVD, no bruit, no mass felt. Heme: No neck lymph node enlargement. Cardiac: S1/S2, RRR, No murmurs, No gallops or rubs. Respiratory: No rales, wheezing, rhonchi or rubs. GI: Soft, nondistended, nontender, no rebound pain, no organomegaly, BS present. GU: No hematuria Ext: has trace leg edema bilaterally. 1+DP/PT pulse bilaterally. Musculoskeletal: has right rib cage pain Skin: No rashes.  Neuro: Alert, oriented X3, cranial nerves II-XII grossly intact, moves all extremities normally.  Psych: Patient is not psychotic, no suicidal or hemocidal ideation.  Labs on Admission: I have personally reviewed following labs and imaging studies  CBC: Recent Labs  Lab 07/14/22 0638 07/15/22 0542  WBC 12.1* 10.3  NEUTROABS 10.0*  --   HGB 10.9* 9.4*  HCT 34.5* 30.3*  MCV 100.3* 101.0*  PLT 252 241   Basic Metabolic Panel: Recent Labs  Lab 07/14/22 0638 07/15/22 0542  NA 138 138  K 4.2 4.0  CL 106 106  CO2 24 25  GLUCOSE 180* 108*  BUN 25* 16  CREATININE 0.85 0.73  CALCIUM 8.6* 8.5*   GFR: Estimated Creatinine Clearance: 95.4 mL/min (by C-G formula based on SCr of 0.73 mg/dL). Liver Function Tests: Recent Labs  Lab 07/14/22 0638  AST 50*  ALT 56*  ALKPHOS 185*  BILITOT 1.2  PROT 7.0  ALBUMIN 3.1*   No results for input(s): "LIPASE", "AMYLASE" in the last 168 hours. No results for input(s): "AMMONIA" in the last 168 hours. Coagulation Profile: Recent Labs  Lab 07/14/22 0654  INR 1.1   Cardiac Enzymes: No results for input(s): "CKTOTAL", "CKMB", "CKMBINDEX", "TROPONINI" in the last 168 hours. BNP (last 3 results) No results for input(s): "PROBNP" in the last 8760 hours. HbA1C: Recent Labs    07/14/22 1037  HGBA1C 6.3*   CBG: No results for input(s): "GLUCAP" in the last 168 hours. Lipid  Profile: No results for input(s): "CHOL", "HDL", "LDLCALC", "TRIG", "CHOLHDL", "LDLDIRECT" in the last 72 hours. Thyroid Function Tests: No results for input(s): "TSH", "T4TOTAL", "FREET4", "T3FREE", "THYROIDAB" in the last 72 hours. Anemia Panel: No results for input(s): "VITAMINB12", "FOLATE", "FERRITIN", "TIBC", "IRON", "RETICCTPCT" in the last 72 hours. Urine analysis:    Component Value Date/Time   COLORURINE AMBER (A) 07/14/2022 0659   APPEARANCEUR CLOUDY (A) 07/14/2022 0659   LABSPEC 1.016 07/14/2022 0659   PHURINE 6.0 07/14/2022 0659   GLUCOSEU NEGATIVE 07/14/2022 0659   HGBUR SMALL (A) 07/14/2022 0659   BILIRUBINUR NEGATIVE 07/14/2022 0659   KETONESUR NEGATIVE 07/14/2022 0659   PROTEINUR  100 (A) 07/14/2022 0659   NITRITE NEGATIVE 07/14/2022 0659   LEUKOCYTESUR LARGE (A) 07/14/2022 0659   Sepsis Labs: (procalcitonin:4,lacticidven:4) ) Recent Results (from the past 240 hour(s))  Blood culture (routine x 2)     Status: Abnormal (Preliminary result)   Collection Time: 07/14/22  6:38 AM   Specimen: BLOOD  Result Value Ref Range Status   Specimen Description   Final    BLOOD RIGHT ANTECUBITAL Performed at [facility], [address] [place], [place] [address]    Special Requests   Final    BOTTLES DRAWN AEROBIC AND ANAEROBIC Blood Culture results may not be optimal due to an excessive volume of blood received in culture bottles Performed at [facility], [address], [place], [place] [address]    Culture  Setup Time   Final    ANAEROBIC BOTTLE ONLY GRAM NEGATIVE RODS Organism ID to follow CRITICAL RESULT CALLED TO, READ BACK BY AND VERIFIED WITH: LISA KLUTTZ 1842 @ 07/14/22 LFD GRAM STAIN REVIEWED-AGREE WITH RESULT    Culture (A)  Final    ESCHERICHIA COLI CULTURE REINCUBATED FOR BETTER GROWTH Performed at [facility], [address] [place], [place] [address]    Report Status PENDING  Incomplete  Blood culture (routine  x 2)     Status: None (Preliminary result)   Collection Time: 07/14/22  6:38 AM   Specimen: BLOOD  Result Value Ref Range Status   Specimen Description BLOOD LEFT ANTECUBITAL  Final   Special Requests   Final    BOTTLES DRAWN AEROBIC AND ANAEROBIC Blood Culture results may not be optimal due to an excessive volume of blood received in culture bottles   Culture   Final    NO GROWTH < 24 HOURS Performed at [facility], [address] [place], [place] [address]    Report Status PENDING  Incomplete  Blood Culture ID Panel (Reflexed)     Status: Abnormal   Collection Time: 07/14/22  6:38 AM  Result Value Ref Range Status   Enterococcus faecalis NOT DETECTED NOT DETECTED Final   Enterococcus Faecium NOT DETECTED NOT DETECTED Final   Listeria monocytogenes NOT DETECTED NOT DETECTED Final   Staphylococcus species NOT DETECTED NOT DETECTED Final   Staphylococcus aureus (BCID) NOT DETECTED NOT DETECTED Final   Staphylococcus epidermidis NOT DETECTED NOT DETECTED Final   Staphylococcus lugdunensis NOT DETECTED NOT DETECTED Final   Streptococcus species NOT DETECTED NOT DETECTED Final   Streptococcus agalactiae NOT DETECTED NOT DETECTED Final   Streptococcus pneumoniae NOT DETECTED NOT DETECTED Final   Streptococcus pyogenes NOT DETECTED NOT DETECTED Final   A.calcoaceticus-baumannii NOT DETECTED NOT DETECTED Final   Bacteroides fragilis NOT DETECTED NOT DETECTED Final   Enterobacterales DETECTED (A) NOT DETECTED Final    Comment: Enterobacterales represent a large order of gram negative bacteria, not a single organism. CRITICAL RESULT CALLED TO, READ BACK BY AND VERIFIED WITH: LISA KLUTTZ @ 1844 07/14/22 LFD    Enterobacter cloacae complex NOT DETECTED NOT DETECTED Final   Escherichia coli DETECTED (A) NOT DETECTED Final    Comment: CRITICAL RESULT CALLED TO, READ BACK BY AND VERIFIED WITH: LISA KLUTTZ @ 1842 07/14/22 LFD    Klebsiella aerogenes NOT DETECTED NOT DETECTED Final    Klebsiella oxytoca NOT DETECTED NOT DETECTED Final   Klebsiella pneumoniae NOT DETECTED NOT DETECTED Final   Proteus species NOT DETECTED NOT DETECTED Final   Salmonella species NOT DETECTED NOT DETECTED Final   Serratia marcescens NOT  DETECTED NOT DETECTED Final   Haemophilus influenzae NOT DETECTED NOT DETECTED Final   Neisseria meningitidis NOT DETECTED NOT DETECTED Final   Pseudomonas aeruginosa NOT DETECTED NOT DETECTED Final   Stenotrophomonas maltophilia NOT DETECTED NOT DETECTED Final   Candida albicans NOT DETECTED NOT DETECTED Final   Candida auris NOT DETECTED NOT DETECTED Final   Candida glabrata NOT DETECTED NOT DETECTED Final   Candida krusei NOT DETECTED NOT DETECTED Final   Candida parapsilosis NOT DETECTED NOT DETECTED Final   Candida tropicalis NOT DETECTED NOT DETECTED Final   Cryptococcus neoformans/gattii NOT DETECTED NOT DETECTED Final   CTX-M ESBL NOT DETECTED NOT DETECTED Final   Carbapenem resistance IMP NOT DETECTED NOT DETECTED Final   Carbapenem resistance KPC NOT DETECTED NOT DETECTED Final   Carbapenem resistance NDM NOT DETECTED NOT DETECTED Final   Carbapenem resist OXA 48 LIKE NOT DETECTED NOT DETECTED Final   Carbapenem resistance VIM NOT DETECTED NOT DETECTED Final    Comment: Performed at [facility], [address] [place], [place] [address]  SARS Coronavirus 2 by RT PCR (hospital order, performed in [facility] hospital lab) *cepheid single result test* Anterior Nasal Swab     Status: None   Collection Time: 07/14/22  6:39 AM   Specimen: Anterior Nasal Swab  Result Value Ref Range Status   SARS Coronavirus 2 by RT PCR NEGATIVE NEGATIVE Final    Comment: (NOTE) SARS-CoV-2 target nucleic acids are NOT DETECTED.  The SARS-CoV-2 RNA is generally detectable in upper and lower respiratory specimens during the acute phase of infection. The lowest concentration of SARS-CoV-2 viral copies this assay can detect is 250 copies / mL. A  negative result does not preclude SARS-CoV-2 infection and should not be used as the sole basis for treatment or other patient management decisions.  A negative result may occur with improper specimen collection / handling, submission of specimen other than nasopharyngeal swab, presence of viral mutation(s) within the areas targeted by this assay, and inadequate number of viral copies (<250 copies / mL). A negative result must be combined with clinical observations, patient history, and epidemiological information.  Fact Sheet for Patients:   RoadLapTop.co.za  Fact Sheet for Healthcare Providers: http://kim-miller.com/  This test is not yet approved or  cleared by the [place] FDA and has been authorized for detection and/or diagnosis of SARS-CoV-2 by FDA under an Emergency Use Authorization (EUA).  This EUA will remain in effect (meaning this test can be used) for the duration of the COVID-19 declaration under Section 564(b)(1) of the Act, 21 U.S.C. section 360bbb-3(b)(1), unless the authorization is terminated or revoked sooner.  Performed at [facility], [address] [place], [place] [address]   Urine Culture     Status: Abnormal (Preliminary result)   Collection Time: 07/14/22  6:59 AM   Specimen: Urine, Clean Catch  Result Value Ref Range Status   Specimen Description   Final    URINE, CLEAN CATCH Performed at [facility], [address] [place], [place] [address]    Special Requests   Final    NONE Performed at [facility], [address] [place], [place] [address]    Culture >=100,000 COLONIES/mL ESCHERICHIA COLI (A)  Final   Report Status PENDING  Incomplete  Expectorated Sputum Assessment w Gram Stain, Rflx to Resp Cult     Status: None   Collection Time: 07/14/22  2:10 PM   Specimen: Urine, Random; Sputum  Result Value Ref Range Status  Specimen Description URINE, RANDOM   Final   Special Requests NONE  Final   Sputum evaluation   Final    Sputum specimen not acceptable for testing.  Please recollect.   NOTIFIED ALLIE HEISEY FOR RECOLLECT ON  07/14/22 AT 1522 QSD Performed at [facility], [address] [place], [place] [address]    Report Status 07/14/2022 FINAL  Final  Expectorated Sputum Assessment w Gram Stain, Rflx to Resp Cult     Status: None   Collection Time: 07/14/22  6:19 PM   Specimen: Sputum  Result Value Ref Range Status   Specimen Description SPUTUM  Final   Special Requests Normal  Final   Sputum evaluation   Final    THIS SPECIMEN IS ACCEPTABLE FOR SPUTUM CULTURE Performed at [facility], [address] [place], [place] [address]    Report Status 07/14/2022 FINAL  Final  Culture, Respiratory w Gram Stain     Status: None (Preliminary result)   Collection Time: 07/14/22  6:19 PM   Specimen: SPU  Result Value Ref Range Status   Specimen Description   Final    SPUTUM Performed at [facility], [address], [place], [place] [address]    Special Requests   Final    Normal Reflexed from [id number] Performed at [facility], [address] [place], [place] [address]    Gram Stain   Final    NO SQUAMOUS EPITHELIAL CELLS SEEN WBC SEEN FEW GRAM POSITIVE RODS FEW GRAM NEGATIVE RODS FEW GRAM POSITIVE COCCI    Culture   Final    TOO YOUNG TO READ Performed at [facility], [address], [place], [place] [address]    Report Status PENDING  Incomplete     Radiological Exams on Admission: US Venous Img Lower Bilateral (DVT)  Result Date: 07/14/2022 CLINICAL DATA:  Positive D-dimer.  Assess for DVT. EXAM: BILATERAL LOWER EXTREMITY VENOUS DOPPLER ULTRASOUND TECHNIQUE: Gray-scale sonography with compression, as well as color and duplex ultrasound, were performed to evaluate the deep venous system(s) from the level of the common femoral vein through the popliteal and proximal calf veins.  COMPARISON:  None Available. FINDINGS: VENOUS Normal compressibility of the common femoral, superficial femoral, and popliteal veins, as well as the visualized calf veins. Visualized portions of profunda femoral vein and great saphenous vein unremarkable. No filling defects to suggest DVT on grayscale or color Doppler imaging. Doppler waveforms show normal direction of venous flow, normal respiratory plasticity and response to augmentation. Limited views of the contralateral common femoral vein are unremarkable. OTHER A fluid collection is identified in the right popliteal fossa measuring 5.3 x 1.2 x 3.2 cm Limitations: none IMPRESSION: 1. No DVT identified in the lower extremities. 2. 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 Electronically Signed   By: Gerome Sam III M.D.   On: 07/14/2022 13:11   CT Angio Chest PE W and/or Wo Contrast  Result Date: 07/14/2022 CLINICAL DATA:  61 year old female with acute shortness of breath, abdominal and pelvic pain and possible sepsis. EXAM: CT ANGIOGRAPHY CHEST CT ABDOMEN AND PELVIS WITH CONTRAST TECHNIQUE: Multidetector CT imaging of the chest was performed using the standard protocol during bolus administration of intravenous contrast. Multiplanar CT image reconstructions and MIPs were obtained to evaluate the vascular anatomy. Multidetector CT imaging of the abdomen and pelvis was performed using the standard protocol during bolus administration of intravenous contrast. RADIATION DOSE REDUCTION: This  exam was performed according to the departmental dose-optimization program which includes automated exposure control, adjustment of the mA and/or kV according to patient size and/or use of iterative reconstruction technique. CONTRAST:  OMNIPAQUE IOHEXOL 350 MG/ML SOLN COMPARISON:  None Available. FINDINGS: CTA CHEST FINDINGS Cardiovascular: This is a technically borderline study for evaluation of pulmonary emboli due to  borderline contrast opacification and some respiratory/streak artifact. No pulmonary emboli are identified. Enlargement of the central pulmonary arteries noted with the main pulmonary artery measuring 4.1 cm in greatest diameter. Cardiomegaly is present. Coronary artery and aortic atherosclerotic calcifications are identified without evidence of thoracic aortic aneurysm or pericardial effusion. Mediastinum/Nodes: No enlarged mediastinal, hilar, or axillary lymph nodes. Thyroid gland, trachea, and esophagus demonstrate no significant findings. Lungs/Pleura: Linear confluent opacity at the LEFT lung base is noted, slightly favor atelectasis over pneumonia/infection. Dependent interstitial opacities bilaterally are noted and may represent atelectasis. No discrete mass, pneumothorax or pleural effusion identified. Musculoskeletal: Nondisplaced fractures of the anterior RIGHT 6th and 7th ribs are noted and appears acute to subacute. No other acute bony abnormalities or focal bony lesions are identified. Review of the MIP images confirms the above findings. CT ABDOMEN and PELVIS FINDINGS Hepatobiliary: At least 4 very small (3-4 mm) hypodense lesions within the posterior RIGHT liver are noted, too small to characterize. No other hepatic abnormalities are identified. The gallbladder is unremarkable. There is no evidence of intrahepatic or extrahepatic biliary dilatation. Pancreas: Unremarkable Spleen: A 2 x 3 x 4 cm wedge-shaped area of decreased attenuation along the INFERIOR spleen is noted and may represent a parenchymal abnormality versus a focal subcapsular collection. No other splenic abnormalities are identified. Adrenals/Urinary Tract: There are ill-defined areas of decreased attenuation within the INFERIOR LEFT kidney with moderate adjacent perinephric inflammation suspicious for infection/pyelonephritis. Slightly more hypodense areas are noted, 1 measuring 1.5 cm within the LOWER LEFT kidney (series 11: Image 78)  and a 1.5 x 2.5 cm subcapsular area(11:83) which may represent phlegmon/developing abscess. There is no evidence of hydronephrosis. A 2 cm indeterminate lesion extending off of the RIGHT UPPER renal pole (11:55-59) measures 52 Hounsfield units. No abnormal adjacent lymph nodes or renal vein thrombosis. There is fullness/nodularity of the LEFT adrenal gland. No bladder abnormalities are identified. No urinary calculi are noted. Stomach/Bowel: Stomach is within normal limits. Appendix appears normal. No evidence of bowel wall thickening, distention, or inflammatory changes. Colonic diverticulosis identified without evidence of acute diverticulitis. 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 Aortic atherosclerosis. No enlarged abdominal or pelvic lymph nodes. Reproductive: Status post hysterectomy. No adnexal masses. Other: There is no evidence of ascites or pneumoperitoneum. Musculoskeletal: No acute or suspicious bony abnormalities are noted. Review of the MIP images confirms the above findings. IMPRESSION: 1. Ill-defined areas of decreased attenuation within the LEFT LOWER renal pole with moderate adjacent perinephric inflammation likely representing infection/pyelonephritis. Slightly more hypodense areas within/along the LOWER LEFT kidney are indeterminate but may represent phlegmon/developing abscess. 2. 2 x 3 x 4 cm wedge-shaped area of decreased attenuation along the INFERIOR spleen, which may represent a parenchymal abnormality (infection/infarct) versus a focal subcapsular collection. 3. 2 cm indeterminate lesion extending off of the RIGHT UPPER kidney. Further evaluation with elective MRI is recommended. 4. Linear focal opacity at the LEFT lung base, slightly favor atelectasis over airspace disease/pneumonia. 5. Nondisplaced fractures of the anterior RIGHT 6th and 7th ribs, which appears acute to subacute. No evidence of pleural effusion or  pneumothorax.  6. No evidence of pulmonary emboli. Enlarged central pulmonary arteries compatible with pulmonary arterial hypertension. 7. At least 4 very small (3-4 mm) hypodense lesions within the posterior RIGHT liver are noted, too small to characterize. 8. 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 9. Cardiomegaly and coronary artery disease. 10. Aortic Atherosclerosis (ICD10-I70.0). Electronically Signed   By: Harmon Pier M.D.   On: 07/14/2022 09:37   CT ABDOMEN PELVIS W CONTRAST  Result Date: 07/14/2022 CLINICAL DATA:  61 year old female with acute shortness of breath, abdominal and pelvic pain and possible sepsis. EXAM: CT ANGIOGRAPHY CHEST CT ABDOMEN AND PELVIS WITH CONTRAST TECHNIQUE: Multidetector CT imaging of the chest was performed using the standard protocol during bolus administration of intravenous contrast. Multiplanar CT image reconstructions and MIPs were obtained to evaluate the vascular anatomy. Multidetector CT imaging of the abdomen and pelvis was performed using the standard protocol during bolus administration of intravenous contrast. RADIATION DOSE REDUCTION: This exam was performed according to the departmental dose-optimization program which includes automated exposure control, adjustment of the mA and/or kV according to patient size and/or use of iterative reconstruction technique. CONTRAST:  OMNIPAQUE IOHEXOL 350 MG/ML SOLN COMPARISON:  None Available. FINDINGS: CTA CHEST FINDINGS Cardiovascular: This is a technically borderline study for evaluation of pulmonary emboli due to borderline contrast opacification and some respiratory/streak artifact. No pulmonary emboli are identified. Enlargement of the central pulmonary arteries noted with the main pulmonary artery measuring 4.1 cm in greatest diameter. Cardiomegaly is present. Coronary artery and aortic atherosclerotic calcifications are identified without evidence of thoracic aortic aneurysm or pericardial  effusion. Mediastinum/Nodes: No enlarged mediastinal, hilar, or axillary lymph nodes. Thyroid gland, trachea, and esophagus demonstrate no significant findings. Lungs/Pleura: Linear confluent opacity at the LEFT lung base is noted, slightly favor atelectasis over pneumonia/infection. Dependent interstitial opacities bilaterally are noted and may represent atelectasis. No discrete mass, pneumothorax or pleural effusion identified. Musculoskeletal: Nondisplaced fractures of the anterior RIGHT 6th and 7th ribs are noted and appears acute to subacute. No other acute bony abnormalities or focal bony lesions are identified. Review of the MIP images confirms the above findings. CT ABDOMEN and PELVIS FINDINGS Hepatobiliary: At least 4 very small (3-4 mm) hypodense lesions within the posterior RIGHT liver are noted, too small to characterize. No other hepatic abnormalities are identified. The gallbladder is unremarkable. There is no evidence of intrahepatic or extrahepatic biliary dilatation. Pancreas: Unremarkable Spleen: A 2 x 3 x 4 cm wedge-shaped area of decreased attenuation along the INFERIOR spleen is noted and may represent a parenchymal abnormality versus a focal subcapsular collection. No other splenic abnormalities are identified. Adrenals/Urinary Tract: There are ill-defined areas of decreased attenuation within the INFERIOR LEFT kidney with moderate adjacent perinephric inflammation suspicious for infection/pyelonephritis. Slightly more hypodense areas are noted, 1 measuring 1.5 cm within the LOWER LEFT kidney (series 11: Image 78) and a 1.5 x 2.5 cm subcapsular area(11:83) which may represent phlegmon/developing abscess. There is no evidence of hydronephrosis. A 2 cm indeterminate lesion extending off of the RIGHT UPPER renal pole (11:55-59) measures 52 Hounsfield units. No abnormal adjacent lymph nodes or renal vein thrombosis. There is fullness/nodularity of the LEFT adrenal gland. No bladder abnormalities  are identified. No urinary calculi are noted. Stomach/Bowel: Stomach is within normal limits. Appendix appears normal. No evidence of bowel wall thickening, distention, or inflammatory changes. Colonic diverticulosis identified without evidence of acute diverticulitis. 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  Aortic atherosclerosis. No enlarged abdominal or pelvic lymph nodes. Reproductive: Status post hysterectomy. No adnexal masses. Other: There is no evidence of ascites or pneumoperitoneum. Musculoskeletal: No acute or suspicious bony abnormalities are noted. Review of the MIP images confirms the above findings. IMPRESSION: 1. Ill-defined areas of decreased attenuation within the LEFT LOWER renal pole with moderate adjacent perinephric inflammation likely representing infection/pyelonephritis. Slightly more hypodense areas within/along the LOWER LEFT kidney are indeterminate but may represent phlegmon/developing abscess. 2. 2 x 3 x 4 cm wedge-shaped area of decreased attenuation along the INFERIOR spleen, which may represent a parenchymal abnormality (infection/infarct) versus a focal subcapsular collection. 3. 2 cm indeterminate lesion extending off of the RIGHT UPPER kidney. Further evaluation with elective MRI is recommended. 4. Linear focal opacity at the LEFT lung base, slightly favor atelectasis over airspace disease/pneumonia. 5. Nondisplaced fractures of the anterior RIGHT 6th and 7th ribs, which appears acute to subacute. No evidence of pleural effusion or pneumothorax. 6. No evidence of pulmonary emboli. Enlarged central pulmonary arteries compatible with pulmonary arterial hypertension. 7. At least 4 very small (3-4 mm) hypodense lesions within the posterior RIGHT liver are noted, too small to characterize. 8. 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 9. Cardiomegaly and coronary artery disease. 10. Aortic  Atherosclerosis (ICD10-I70.0). Electronically Signed   By: Harmon Pier M.D.   On: 07/14/2022 09:37   DG Chest Portable 1 View  Result Date: 07/14/2022 CLINICAL DATA:  61 year old female with history of shortness of breath and fever. EXAM: PORTABLE CHEST 1 VIEW COMPARISON:  No priors. FINDINGS: Lung volumes are slightly low. Ill-defined opacity in the left lung base which may reflect atelectasis and/or consolidation. No definite pleural effusions. No pneumothorax. Cephalization of the pulmonary vasculature, without frank pulmonary edema. Heart size is normal. Upper mediastinal contours are within normal limits. Atherosclerotic calcifications are noted in the thoracic aorta. IMPRESSION: 1. Ill-defined opacity in the left lung base which may reflect a small area of atelectasis and/or consolidation. 2. Cephalization of the pulmonary vasculature, without frank pulmonary edema. 3. Aortic atherosclerosis. Electronically Signed   By: Trudie Reed M.D.   On: 07/14/2022 06:55      Assessment/Plan Principal Problem:   Acute pyelonephritis Active Problems:   Sepsis (HCC)   CAP (community acquired pneumonia)   COPD (chronic obstructive pulmonary disease) (HCC)   HTN (hypertension)   Myocardial injury   RA (rheumatoid arthritis) (HCC)   Chronic diastolic CHF (congestive heart failure) (HCC)   Right rib fracture   Lesion of spleen   Liver lesion   Positive D dimer   Kidney lesion   Obesity, Class III, BMI 40-49.9 (morbid obesity) (HCC)   Assessment and Plan: * Acute pyelonephritis Sepsis due to acute pyelonephritis: Patient meets criteria for sepsis with WBC 12.1, heart rate of 102, lactic acid is normal at 0.8.  Currently hemodynamically stable.  -will admit to tele bed as inpt -IV Rocephin -Follow-up blood culture and urine culture -Check procalcitonin level --> <0.10 -IV fluid: 1 L LR, followed by 75 cc/h  Sepsis (HCC) - See above  CAP (community acquired pneumonia) Possible CAP:  Although patient does not have symptoms of pneumonia, patient has increased oxygen requirement from 2 to 3 L.  No respiratory distress.  Chest x-ray showed possible left basilar opacity.  CTA negative for PE, but showed left lower lobe opacity, indicating possible pneumonia.  - IV Rocephin and azithromycin - Mucinex for cough  - Bronchodilators - Urine legionella and S. pneumococcal  antigen - Follow up blood culture x2, sputum culture    COPD (chronic obstructive pulmonary disease) (HCC) No wheezing on auscultation -Bronchodilators  HTN (hypertension) - IV hydralazine as needed -Hold Lasix since patient need IV fluid for sepsis  Myocardial injury Myocardial injury due to sepsis and ongoing infection. Troponin 18 --> 19 -Check A1c, FLP -Will not give aspirin due to abnormal findings on spleen by CTA (not sure if it is hematoma not)  RA (rheumatoid arthritis) (HCC) - Continue home methotrexate, Plaquenil, sulfasalazine -Increase prednisone dose from 5 to 15 mg daily  Chronic diastolic CHF (congestive heart failure) (HCC) 2D echo on 07/11/2022 showed EF> 55%.  Patient has trace leg edema, no pulm edema chest x-ray, BNP 156, does not seem to have CHF exacerbation. -Hold Lasix due to sepsis -Watch volume status closely  Right rib fracture - Pain control: As needed oxycodone, morphine -Incentive spirometry  Lesion of spleen CTA showed a 2 x 3 x 4 cm wedge-shaped area of decreased attenuation along the INFERIOR spleen, which may represent a parenchymal abnormality (infection/infarct) versus a focal subcapsular collection. Unclear etiology. -I discussed with patient about these findings.  Recommend patient to follow-up with his PCP as outpatient to repeat CT scan.  She agreed to do so  Liver lesion CTA showed at least 4 very small (3-4 mm) hypodense lesions within the posterior RIGHT liver are noted, too small to characterize. -I recommend patient to follow-up with PCP to repeat  image as outpatient  Obesity, Class III, BMI 40-49.9 (morbid obesity) (HCC) BMI= 42.06   and BW= 118.2 -Diet and exercise.   -Encourage to lose weight.    Kidney lesion CTA showed a 2 cm indeterminate lesion extending off of the RIGHT UPPER kidney. Further evaluation with elective MRI is recommended. -I recommended patient to follow-up with PCP for outpatient image  Positive D dimer LE doppler showed: 1. No DVT identified in the lower extremities. 2. 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          DVT ppx: SCD  Code Status: Full code  Family Communication: not done, no family member is at bed side.       Disposition Plan:  Anticipate discharge back to previous environment  Consults called:  none  Admission status and Level of care: Telemetry Medical:   as inpt     Severity of Illness:  The appropriate patient status for this patient is INPATIENT. Inpatient status is judged to be reasonable and necessary in order to provide the required intensity of service to ensure the patient's safety. The patient's presenting symptoms, physical exam findings, and initial radiographic and laboratory data in the context of their chronic comorbidities is felt to place them at high risk for further clinical deterioration. Furthermore, it is not anticipated that the patient will be medically stable for discharge from the hospital within 2 midnights of admission.   * I certify that at the point of admission it is my clinical judgment that the patient will require inpatient hospital care spanning beyond 2 midnights from the point of admission due to high intensity of service, high risk for further deterioration and high frequency of surveillance required.*       Date of Service 07/15/2022    Lorretta Harp [facility]   If 7PM-7AM, please contact night-coverage [email or web address] 07/15/2022, 11:15 AM

## 2022-07-14 NOTE — Assessment & Plan Note (Signed)
2D echo on 07/11/2022 showed EF> 55%.  Patient has trace leg edema, no pulm edema chest x-ray, BNP 156, does not seem to have CHF exacerbation. -Hold Lasix due to sepsis -Watch volume status closely

## 2022-07-14 NOTE — ED Notes (Signed)
Pt. To CT

## 2022-07-14 NOTE — Assessment & Plan Note (Signed)
CTA showed at least 4 very small (3-4 mm) hypodense lesions within the posterior RIGHT liver are noted, too small to characterize. -I recommend patient to follow-up with PCP to repeat image as outpatient

## 2022-07-14 NOTE — Assessment & Plan Note (Signed)
See above

## 2022-07-14 NOTE — Assessment & Plan Note (Signed)
-   Pain control: As needed oxycodone, morphine -Incentive spirometry

## 2022-07-14 NOTE — Progress Notes (Signed)
Pt asked this RN to remove Bipap, says too much air is getting in her system and wants to shift back to her oxygen via nasal cannula. RT Made aware.

## 2022-07-14 NOTE — Assessment & Plan Note (Signed)
LE doppler showed: 1. No DVT identified in the lower extremities. 2. 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

## 2022-07-14 NOTE — Assessment & Plan Note (Signed)
Possible CAP: Although patient does not have symptoms of pneumonia, patient has increased oxygen requirement from 2 to 3 L.  No respiratory distress.  Chest x-ray showed possible left basilar opacity.  CTA negative for PE, but showed left lower lobe opacity, indicating possible pneumonia.  - IV Rocephin and azithromycin - Mucinex for cough  - Bronchodilators - Urine legionella and S. pneumococcal antigen - Follow up blood culture x2, sputum culture

## 2022-07-14 NOTE — ED Triage Notes (Signed)
Pt is normally on 2 lpm O2 and woke this AM with SpO2 87%. Pt has Hx of COPD and became concerned about low SpO2. Pt started having temp and chills on Thursday. Maintaining fever with tylenol.

## 2022-07-14 NOTE — Assessment & Plan Note (Signed)
-   IV hydralazine as needed -Hold Lasix since patient need IV fluid for sepsis

## 2022-07-14 NOTE — Progress Notes (Signed)
PHARMACY - PHYSICIAN COMMUNICATION CRITICAL VALUE ALERT - BLOOD CULTURE IDENTIFICATION (BCID)  Kelly Trujillo is an 61 y.o. female who presented to [facility] on 07/14/2022 with a chief complaint of urinary frequency and burning and shortness of breath and cough  Assessment:  Suspected pyelonephritis and possible CAP. 1 of 4 bottles (anaerobic) with GNR, BCID - E.coli  Name of physician (or Provider) Contacted: Jon Billings, NP  Current antibiotics: Ceftriaxone 2g IV q24h and Azitthromycin  Changes to prescribed antibiotics recommended:  Patient is on recommended antibiotics - No changes needed  Results for orders placed or performed during the hospital encounter of 07/14/22  Blood Culture ID Panel (Reflexed) (Collected: 07/14/2022  6:38 AM)  Result Value Ref Range   Enterococcus faecalis NOT DETECTED NOT DETECTED   Enterococcus Faecium NOT DETECTED NOT DETECTED   Listeria monocytogenes NOT DETECTED NOT DETECTED   Staphylococcus species NOT DETECTED NOT DETECTED   Staphylococcus aureus (BCID) NOT DETECTED NOT DETECTED   Staphylococcus epidermidis NOT DETECTED NOT DETECTED   Staphylococcus lugdunensis NOT DETECTED NOT DETECTED   Streptococcus species NOT DETECTED NOT DETECTED   Streptococcus agalactiae NOT DETECTED NOT DETECTED   Streptococcus pneumoniae NOT DETECTED NOT DETECTED   Streptococcus pyogenes NOT DETECTED NOT DETECTED   A.calcoaceticus-baumannii NOT DETECTED NOT DETECTED   Bacteroides fragilis NOT DETECTED NOT DETECTED   Enterobacterales DETECTED (A) NOT DETECTED   Enterobacter cloacae complex NOT DETECTED NOT DETECTED   Escherichia coli DETECTED (A) NOT DETECTED   Klebsiella aerogenes NOT DETECTED NOT DETECTED   Klebsiella oxytoca NOT DETECTED NOT DETECTED   Klebsiella pneumoniae NOT DETECTED NOT DETECTED   Proteus species NOT DETECTED NOT DETECTED   Salmonella species NOT DETECTED NOT DETECTED   Serratia marcescens NOT DETECTED NOT DETECTED   Haemophilus influenzae NOT  DETECTED NOT DETECTED   Neisseria meningitidis NOT DETECTED NOT DETECTED   Pseudomonas aeruginosa NOT DETECTED NOT DETECTED   Stenotrophomonas maltophilia NOT DETECTED NOT DETECTED   Candida albicans NOT DETECTED NOT DETECTED   Candida auris NOT DETECTED NOT DETECTED   Candida glabrata NOT DETECTED NOT DETECTED   Candida krusei NOT DETECTED NOT DETECTED   Candida parapsilosis NOT DETECTED NOT DETECTED   Candida tropicalis NOT DETECTED NOT DETECTED   Cryptococcus neoformans/gattii NOT DETECTED NOT DETECTED   CTX-M ESBL NOT DETECTED NOT DETECTED   Carbapenem resistance IMP NOT DETECTED NOT DETECTED   Carbapenem resistance KPC NOT DETECTED NOT DETECTED   Carbapenem resistance NDM NOT DETECTED NOT DETECTED   Carbapenem resist OXA 48 LIKE NOT DETECTED NOT DETECTED   Carbapenem resistance VIM NOT DETECTED NOT DETECTED    Clovia Cuff, PharmD, BCPS 07/14/2022 8:08 PM

## 2022-07-14 NOTE — Sepsis Progress Note (Signed)
Elink following code sepsis °

## 2022-07-14 NOTE — ED Notes (Signed)
ED Provider at bedside. Updating pt. On results and poc.

## 2022-07-14 NOTE — Plan of Care (Signed)
?  Problem: Activity: ?Goal: Risk for activity intolerance will decrease ?Outcome: Progressing ?  ?Problem: Safety: ?Goal: Ability to remain free from injury will improve ?Outcome: Progressing ?  ?Problem: Pain Managment: ?Goal: General experience of comfort will improve ?Outcome: Progressing ?  ?

## 2022-07-14 NOTE — ED Notes (Signed)
[place] left room, completed. Les, EMT contacted for tranport

## 2022-07-15 DIAGNOSIS — N10 Acute tubulo-interstitial nephritis: Secondary | ICD-10-CM | POA: Diagnosis not present

## 2022-07-15 DIAGNOSIS — E66813 Obesity, class 3: Secondary | ICD-10-CM | POA: Diagnosis present

## 2022-07-15 LAB — BASIC METABOLIC PANEL
Anion gap: 7 (ref 5–15)
BUN: 16 mg/dL (ref 8–23)
CO2: 25 mmol/L (ref 22–32)
Calcium: 8.5 mg/dL — ABNORMAL LOW (ref 8.9–10.3)
Chloride: 106 mmol/L (ref 98–111)
Creatinine, Ser: 0.73 mg/dL (ref 0.44–1.00)
GFR, Estimated: >60 mL/min (ref >60)
Glucose, Bld: 108 mg/dL — ABNORMAL HIGH (ref 70–99)
Potassium: 4 mmol/L (ref 3.5–5.1)
Sodium: 138 mmol/L (ref 135–145)

## 2022-07-15 LAB — CBC
HCT: 30.3 % — ABNORMAL LOW (ref 36.0–46.0)
Hemoglobin: 9.4 g/dL — ABNORMAL LOW (ref 12.0–15.0)
MCH: 31.3 pg (ref 26.0–34.0)
MCHC: 31 g/dL (ref 30.0–36.0)
MCV: 101 fL — ABNORMAL HIGH (ref 80.0–100.0)
Platelets: 241 10*3/uL (ref 150–400)
RBC: 3 MIL/uL — ABNORMAL LOW (ref 3.87–5.11)
RDW: 14.9 % (ref 11.5–15.5)
WBC: 10.3 10*3/uL (ref 4.0–10.5)
nRBC: 0 % (ref 0.0–0.2)

## 2022-07-15 LAB — HIV ANTIBODY (ROUTINE TESTING W REFLEX): HIV Screen 4th Generation wRfx: NONREACTIVE

## 2022-07-15 NOTE — Progress Notes (Signed)
Seen for bipap therapy. Patient refused. Stated may be going home tomorrow.

## 2022-07-15 NOTE — Plan of Care (Signed)

## 2022-07-15 NOTE — Plan of Care (Signed)
  Problem: Activity: Goal: Risk for activity intolerance will decrease Outcome: Progressing   Problem: Pain Managment: Goal: General experience of comfort will improve Outcome: Progressing   Problem: Safety: Goal: Ability to remain free from injury will improve Outcome: Progressing   

## 2022-07-15 NOTE — Assessment & Plan Note (Signed)
BMI= 42.06   and BW= 118.2 -Diet and exercise.   -Encourage to lose weight.

## 2022-07-15 NOTE — Progress Notes (Signed)
PROGRESS NOTE    Kelly Trujillo  [id number] DOB: [birth year] DOA: 07/14/2022 PCP: Sandra Cockayne, MD    Brief Narrative:   61 y.o. female with medical history significant of COPD and remote tobacco abuse chronically on 2 L nasal cannula during the day and BiPAP at night, HTN, sciatica, dCHF, rheumatoid arthritis on methotrexate and hydroxychloroquine, who presents with fever, chills, and urinary frequency.   Patient states that she has not been feeling well for 3 days.  She has fever, chills, body aches.  She has increased urinary frequency and burning on urination, no dysuria or hematuria. Patient states she saw her oxygen desaturation down to 85% on her home level of 2 L oxygen. She has to increase her oxygen to 3 L this AM.  She denies chest pain and shortness of breath and cough.  No nausea, vomiting, diarrhea or abdominal pain.    CT findings reviewed in detail with the patient.  At this point will treat complicated UTI/pyelonephritis with IV antibiotics and intravenous fluids.  Remainder of findings on CT will be discussed between patient and her primary care physician   Assessment & Plan:   Principal Problem:   Acute pyelonephritis Active Problems:   Sepsis ([place])   CAP (community acquired pneumonia)   COPD (chronic obstructive pulmonary disease) (HCC)   HTN (hypertension)   Myocardial injury   RA (rheumatoid arthritis) ([place])   Chronic diastolic CHF (congestive heart failure) ([place])   Right rib fracture   Lesion of spleen   Liver lesion   Positive D dimer   Kidney lesion  Acute pyelonephritis Sepsis due to acute pyelonephritis Patient meets criteria for sepsis with WBC 12.1, heart rate of 102, lactic acid is normal at 0.8.  Currently hemodynamically stable. Plan: Continue IV Rocephin Follow cultures Supplemental IV fluids Multimodal pain control Monitor vitals and fever curve   CAP (community acquired pneumonia) Possible CAP Although patient does not have  symptoms of pneumonia, patient has increased oxygen requirement from 2 to 3 L.  No respiratory distress.  Chest x-ray showed possible left basilar opacity.  CTA negative for PE, but showed left lower lobe opacity, indicating possible pneumonia. Plan: Rocephin as above Azithromycin 500 daily x3 doses Pulse oximetry with vital sign checks  Chronic hypoxic respiratory failure Patient was briefly on 3 L nasal cannula, now back to 2 L home right Continue oxygen via nasal cannula Saturation greater than 88%     COPD (chronic obstructive pulmonary disease) (HCC) No wheezing on auscultation -Bronchodilators   HTN (hypertension) - IV hydralazine as needed -Hold Lasix since patient need IV fluid for sepsis   Myocardial injury Myocardial injury due to sepsis and ongoing infection. Troponin 18 --> 19 -Check A1c, FLP -Will not give aspirin due to abnormal findings on spleen by CTA (not sure if it is hematoma not)   RA (rheumatoid arthritis) (HCC) - Continue home methotrexate, Plaquenil, sulfasalazine -Increase prednisone dose from 5 to 15 mg daily.  We will taper off quickly as clinical status improves   Chronic diastolic CHF (congestive heart failure) ([place]) 2D echo on 07/11/2022 showed EF> 55%.  Patient has trace leg edema, no pulm edema chest x-ray, BNP 156, does not seem to have CHF exacerbation. -Hold Lasix due to sepsis -Watch volume status closely   Right rib fracture - Pain control: As needed oxycodone, morphine -Incentive spirometry   Lesion of spleen CTA showed a 2 x 3 x 4 cm wedge-shaped area of decreased attenuation along the INFERIOR spleen, which  may represent a parenchymal abnormality (infection/infarct) versus a focal subcapsular collection. Unclear etiology. -I discussed with patient about these findings.  Recommend patient to follow-up with his PCP as outpatient to repeat CT scan.  She agreed to do so   Liver lesion CTA showed at least 4 very small (3-4 mm) hypodense  lesions within the posterior RIGHT liver are noted, too small to characterize. -I recommend patient to follow-up with PCP to repeat image as outpatient   Kidney lesion CTA showed a 2 cm indeterminate lesion extending off of the RIGHT UPPER kidney. Further evaluation with elective MRI is recommended. -I recommended patient to follow-up with PCP for outpatient image   Positive D dimer LE doppler showed: 1. No DVT identified in the lower extremities. 2. 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  Morbid obesity BMI [id number] This complicates overall care and prognosis   DVT prophylaxis: SCD Code Status: Full Family Communication: None Disposition Plan: Status is: Inpatient Remains inpatient appropriate because: Sepsis due to acute pyelonephritis   Level of care: Telemetry Medical  Consultants:  None  Procedures:  None  Antimicrobials: Ceftriaxone Azithromycin   Subjective: Seen and examined.  Endorses symptomatic improvement since admission.  Does complain of right-sided chest wall pain  Objective: Vitals:   07/15/22 0412 07/15/22 0500 07/15/22 0759 07/15/22 0812  BP: (!) 114/52   120/61  Pulse: 71   72  Resp: 18   20  Temp:    97.8 F (36.6 C)  TempSrc:      SpO2: 97%  96% 96%  Weight:  118.2 kg    Height:        Intake/Output Summary (Last 24 hours) at 07/15/2022 1104 Last data filed at 07/15/2022 0936 Gross per 24 hour  Intake 2000.99 ml  Output 950 ml  Net 1050.99 ml   Filed Weights   07/14/22 0637 07/15/22 0500  Weight: 118.4 kg 118.2 kg    Examination:  General exam: NAD Respiratory system: Crackles at right base.  Normal work of breathing.  2 L Cardiovascular system: S1-S2, RRR, no murmurs, no pedal edema Gastrointestinal system: Obese, soft, mild TTP right upper quadrant, mild TTP right lower quadrant Central nervous system: Alert and oriented. No focal neurological deficits. Extremities: Symmetric 5 x 5  power. Skin: No rashes, lesions or ulcers Psychiatry: Judgement and insight appear normal. Mood & affect appropriate.     Data Reviewed: I have personally reviewed following labs and imaging studies  CBC: Recent Labs  Lab 07/14/22 0638 07/15/22 0542  WBC 12.1* 10.3  NEUTROABS 10.0*  --   HGB 10.9* 9.4*  HCT 34.5* 30.3*  MCV 100.3* 101.0*  PLT 252 [id number]   Basic Metabolic Panel: Recent Labs  Lab 07/14/22 0638 07/15/22 0542  NA 138 138  K 4.2 4.0  CL 106 106  CO2 24 25  GLUCOSE 180* 108*  BUN 25* 16  CREATININE 0.85 0.73  CALCIUM 8.6* 8.5*   GFR: Estimated Creatinine Clearance: 95.4 mL/min (by C-G formula based on SCr of 0.73 mg/dL). Liver Function Tests: Recent Labs  Lab 07/14/22 0638  AST 50*  ALT 56*  ALKPHOS 185*  BILITOT 1.2  PROT 7.0  ALBUMIN 3.1*   No results for input(s): "LIPASE", "AMYLASE" in the last 168 hours. No results for input(s): "AMMONIA" in the last 168 hours. Coagulation Profile: Recent Labs  Lab 07/14/22 0654  INR 1.1   Cardiac Enzymes: No results for input(s): "CKTOTAL", "CKMB", "CKMBINDEX", "TROPONINI"  in the last 168 hours. BNP (last 3 results) No results for input(s): "PROBNP" in the last 8760 hours. HbA1C: Recent Labs    07/14/22 1037  HGBA1C 6.3*   CBG: No results for input(s): "GLUCAP" in the last 168 hours. Lipid Profile: No results for input(s): "CHOL", "HDL", "LDLCALC", "TRIG", "CHOLHDL", "LDLDIRECT" in the last 72 hours. Thyroid Function Tests: No results for input(s): "TSH", "T4TOTAL", "FREET4", "T3FREE", "THYROIDAB" in the last 72 hours. Anemia Panel: No results for input(s): "VITAMINB12", "FOLATE", "FERRITIN", "TIBC", "IRON", "RETICCTPCT" in the last 72 hours. Sepsis Labs: Recent Labs  Lab 07/14/22 0638  PROCALCITON 1.01  LATICACIDVEN 0.8    Recent Results (from the past 240 hour(s))  Blood culture (routine x 2)     Status: Abnormal (Preliminary result)   Collection Time: 07/14/22  6:38 AM   Specimen:  BLOOD  Result Value Ref Range Status   Specimen Description   Final    BLOOD RIGHT ANTECUBITAL Performed at [facility], [address], [place], [place] [address]    Special Requests   Final    BOTTLES DRAWN AEROBIC AND ANAEROBIC Blood Culture results may not be optimal due to an excessive volume of blood received in culture bottles Performed at [facility], [address] [place], [place] [address]    Culture  Setup Time   Final    ANAEROBIC BOTTLE ONLY GRAM NEGATIVE RODS Organism ID to follow CRITICAL RESULT CALLED TO, READ BACK BY AND VERIFIED WITH: LISA KLUTTZ [id number] @ 07/14/22 LFD GRAM STAIN REVIEWED-AGREE WITH RESULT    Culture (A)  Final    ESCHERICHIA COLI CULTURE REINCUBATED FOR BETTER GROWTH Performed at [facility], [place] [address], [place], [place] [address]    Report Status PENDING  Incomplete  Blood culture (routine x 2)     Status: None (Preliminary result)   Collection Time: 07/14/22  6:38 AM   Specimen: BLOOD  Result Value Ref Range Status   Specimen Description BLOOD LEFT ANTECUBITAL  Final   Special Requests   Final    BOTTLES DRAWN AEROBIC AND ANAEROBIC Blood Culture results may not be optimal due to an excessive volume of blood received in culture bottles   Culture   Final    NO GROWTH < 24 HOURS Performed at [facility], [address] [place], [place] [address]    Report Status PENDING  Incomplete  Blood Culture ID Panel (Reflexed)     Status: Abnormal   Collection Time: 07/14/22  6:38 AM  Result Value Ref Range Status   Enterococcus faecalis NOT DETECTED NOT DETECTED Final   Enterococcus Faecium NOT DETECTED NOT DETECTED Final   Listeria monocytogenes NOT DETECTED NOT DETECTED Final   Staphylococcus species NOT DETECTED NOT DETECTED Final   Staphylococcus aureus (BCID) NOT DETECTED NOT DETECTED Final   Staphylococcus epidermidis NOT DETECTED NOT DETECTED Final   Staphylococcus lugdunensis NOT DETECTED NOT  DETECTED Final   Streptococcus species NOT DETECTED NOT DETECTED Final   Streptococcus agalactiae NOT DETECTED NOT DETECTED Final   Streptococcus pneumoniae NOT DETECTED NOT DETECTED Final   Streptococcus pyogenes NOT DETECTED NOT DETECTED Final   A.calcoaceticus-baumannii NOT DETECTED NOT DETECTED Final   Bacteroides fragilis NOT DETECTED NOT DETECTED Final   Enterobacterales DETECTED (A) NOT DETECTED Final    Comment: Enterobacterales represent a large order of gram negative bacteria, not a single organism. CRITICAL RESULT CALLED TO, READ BACK BY AND VERIFIED WITH: LISA KLUTTZ @ [id number] 07/14/22 LFD  Enterobacter cloacae complex NOT DETECTED NOT DETECTED Final   Escherichia coli DETECTED (A) NOT DETECTED Final    Comment: CRITICAL RESULT CALLED TO, READ BACK BY AND VERIFIED WITH: LISA KLUTTZ @ [id number] 07/14/22 LFD    Klebsiella aerogenes NOT DETECTED NOT DETECTED Final   Klebsiella oxytoca NOT DETECTED NOT DETECTED Final   Klebsiella pneumoniae NOT DETECTED NOT DETECTED Final   Proteus species NOT DETECTED NOT DETECTED Final   Salmonella species NOT DETECTED NOT DETECTED Final   Serratia marcescens NOT DETECTED NOT DETECTED Final   Haemophilus influenzae NOT DETECTED NOT DETECTED Final   Neisseria meningitidis NOT DETECTED NOT DETECTED Final   Pseudomonas aeruginosa NOT DETECTED NOT DETECTED Final   Stenotrophomonas maltophilia NOT DETECTED NOT DETECTED Final   Candida albicans NOT DETECTED NOT DETECTED Final   Candida auris NOT DETECTED NOT DETECTED Final   Candida glabrata NOT DETECTED NOT DETECTED Final   Candida krusei NOT DETECTED NOT DETECTED Final   Candida parapsilosis NOT DETECTED NOT DETECTED Final   Candida tropicalis NOT DETECTED NOT DETECTED Final   Cryptococcus neoformans/gattii NOT DETECTED NOT DETECTED Final   CTX-M ESBL NOT DETECTED NOT DETECTED Final   Carbapenem resistance IMP NOT DETECTED NOT DETECTED Final   Carbapenem resistance KPC NOT DETECTED NOT DETECTED  Final   Carbapenem resistance NDM NOT DETECTED NOT DETECTED Final   Carbapenem resist OXA 48 LIKE NOT DETECTED NOT DETECTED Final   Carbapenem resistance VIM NOT DETECTED NOT DETECTED Final    Comment: Performed at [facility], [address] [place], [place] [address]  SARS Coronavirus 2 by RT PCR (hospital order, performed in [place] hospital lab) *cepheid single result test* Anterior Nasal Swab     Status: None   Collection Time: 07/14/22  6:39 AM   Specimen: Anterior Nasal Swab  Result Value Ref Range Status   SARS Coronavirus 2 by RT PCR NEGATIVE NEGATIVE Final    Comment: (NOTE) SARS-CoV-2 target nucleic acids are NOT DETECTED.  The SARS-CoV-2 RNA is generally detectable in upper and lower respiratory specimens during the acute phase of infection. The lowest concentration of SARS-CoV-2 viral copies this assay can detect is 250 copies / mL. A negative result does not preclude SARS-CoV-2 infection and should not be used as the sole basis for treatment or other patient management decisions.  A negative result may occur with improper specimen collection / handling, submission of specimen other than nasopharyngeal swab, presence of viral mutation(s) within the areas targeted by this assay, and inadequate number of viral copies (<250 copies / mL). A negative result must be combined with clinical observations, patient history, and epidemiological information.  Fact Sheet for Patients:   https://www.patel.info/  Fact Sheet for Healthcare Providers: https://hall.com/  This test is not yet approved or  cleared by the [place] FDA and has been authorized for detection and/or diagnosis of SARS-CoV-2 by FDA under an Emergency Use Authorization (EUA).  This EUA will remain in effect (meaning this test can be used) for the duration of the COVID-19 declaration under Section 564(b)(1) of the Act, 21 U.S.C. section 360bbb-3(b)(1),  unless the authorization is terminated or revoked sooner.  Performed at [facility], [address], [place], [place] [address]   Urine Culture     Status: Abnormal (Preliminary result)   Collection Time: 07/14/22  6:59 AM   Specimen: Urine, Clean Catch  Result Value Ref Range Status   Specimen Description   Final    URINE, CLEAN CATCH Performed at  [facility], [address] [place] [address], [place], [place] [address]    Special Requests   Final    NONE Performed at [facility], [address] [place], [place] [address]    Culture >=100,000 COLONIES/mL ESCHERICHIA COLI (A)  Final   Report Status PENDING  Incomplete  Expectorated Sputum Assessment w Gram Stain, Rflx to Resp Cult     Status: None   Collection Time: 07/14/22  2:10 PM   Specimen: Urine, Random; Sputum  Result Value Ref Range Status   Specimen Description URINE, RANDOM  Final   Special Requests NONE  Final   Sputum evaluation   Final    Sputum specimen not acceptable for testing.  Please recollect.   NOTIFIED ALLIE HEISEY FOR RECOLLECT ON  07/14/22 AT 1522 QSD Performed at [facility], [address] [place], [place] [address]    Report Status 07/14/2022 FINAL  Final  Expectorated Sputum Assessment w Gram Stain, Rflx to Resp Cult     Status: None   Collection Time: 07/14/22  6:19 PM   Specimen: Sputum  Result Value Ref Range Status   Specimen Description SPUTUM  Final   Special Requests Normal  Final   Sputum evaluation   Final    THIS SPECIMEN IS ACCEPTABLE FOR SPUTUM CULTURE Performed at [facility], [address], [place], [place] [address]    Report Status 07/14/2022 FINAL  Final  Culture, Respiratory w Gram Stain     Status: None (Preliminary result)   Collection Time: 07/14/22  6:19 PM   Specimen: SPU  Result Value Ref Range Status   Specimen Description   Final    SPUTUM Performed at [facility], [address], [place], [place] [address]     Special Requests   Final    Normal Reflexed from [id number] Performed at [facility], [address] [place], [place] [address]    Gram Stain   Final    NO SQUAMOUS EPITHELIAL CELLS SEEN WBC SEEN FEW GRAM POSITIVE RODS FEW GRAM NEGATIVE RODS FEW GRAM POSITIVE COCCI    Culture   Final    TOO YOUNG TO READ Performed at [facility], [place] [address] [place], [place] [address]    Report Status PENDING  Incomplete         Radiology Studies: US Venous Img Lower Bilateral (DVT)  Result Date: 07/14/2022 CLINICAL DATA:  Positive D-dimer.  Assess for DVT. EXAM: BILATERAL LOWER EXTREMITY VENOUS DOPPLER ULTRASOUND TECHNIQUE: Gray-scale sonography with compression, as well as color and duplex ultrasound, were performed to evaluate the deep venous system(s) from the level of the common femoral vein through the popliteal and proximal calf veins. COMPARISON:  None Available. FINDINGS: VENOUS Normal compressibility of the common femoral, superficial femoral, and popliteal veins, as well as the visualized calf veins. Visualized portions of profunda femoral vein and great saphenous vein unremarkable. No filling defects to suggest DVT on grayscale or color Doppler imaging. Doppler waveforms show normal direction of venous flow, normal respiratory plasticity and response to augmentation. Limited views of the contralateral common femoral vein are unremarkable. OTHER A fluid collection is identified in the right popliteal fossa measuring 5.3 x 1.2 x 3.2 cm Limitations: none IMPRESSION: 1. No DVT identified in the lower extremities. 2. 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 Electronically Signed   By: Dorise Bullion III M.D.   On: 07/14/2022 13:11   CT Angio Chest  PE W and/or Wo Contrast  Result Date: 07/14/2022 CLINICAL DATA:  61 year old female with acute shortness of breath, abdominal and pelvic pain and possible sepsis. EXAM: CT ANGIOGRAPHY CHEST CT  ABDOMEN AND PELVIS WITH CONTRAST TECHNIQUE: Multidetector CT imaging of the chest was performed using the standard protocol during bolus administration of intravenous contrast. Multiplanar CT image reconstructions and MIPs were obtained to evaluate the vascular anatomy. Multidetector CT imaging of the abdomen and pelvis was performed using the standard protocol during bolus administration of intravenous contrast. RADIATION DOSE REDUCTION: This exam was performed according to the departmental dose-optimization program which includes automated exposure control, adjustment of the mA and/or kV according to patient size and/or use of iterative reconstruction technique. CONTRAST:  153mL OMNIPAQUE IOHEXOL 350 MG/ML SOLN COMPARISON:  None Available. FINDINGS: CTA CHEST FINDINGS Cardiovascular: This is a technically borderline study for evaluation of pulmonary emboli due to borderline contrast opacification and some respiratory/streak artifact. No pulmonary emboli are identified. Enlargement of the central pulmonary arteries noted with the main pulmonary artery measuring 4.1 cm in greatest diameter. Cardiomegaly is present. Coronary artery and aortic atherosclerotic calcifications are identified without evidence of thoracic aortic aneurysm or pericardial effusion. Mediastinum/Nodes: No enlarged mediastinal, hilar, or axillary lymph nodes. Thyroid gland, trachea, and esophagus demonstrate no significant findings. Lungs/Pleura: Linear confluent opacity at the LEFT lung base is noted, slightly favor atelectasis over pneumonia/infection. Dependent interstitial opacities bilaterally are noted and may represent atelectasis. No discrete mass, pneumothorax or pleural effusion identified. Musculoskeletal: Nondisplaced fractures of the anterior RIGHT 6th and 7th ribs are noted and appears acute to subacute. No other acute bony abnormalities or focal bony lesions are identified. Review of the MIP images confirms the above findings. CT  ABDOMEN and PELVIS FINDINGS Hepatobiliary: At least 4 very small (3-4 mm) hypodense lesions within the posterior RIGHT liver are noted, too small to characterize. No other hepatic abnormalities are identified. The gallbladder is unremarkable. There is no evidence of intrahepatic or extrahepatic biliary dilatation. Pancreas: Unremarkable Spleen: A 2 x 3 x 4 cm wedge-shaped area of decreased attenuation along the INFERIOR spleen is noted and may represent a parenchymal abnormality versus a focal subcapsular collection. No other splenic abnormalities are identified. Adrenals/Urinary Tract: There are ill-defined areas of decreased attenuation within the INFERIOR LEFT kidney with moderate adjacent perinephric inflammation suspicious for infection/pyelonephritis. Slightly more hypodense areas are noted, 1 measuring 1.5 cm within the LOWER LEFT kidney (series 11: Image 78) and a 1.5 x 2.5 cm subcapsular area(11:83) which may represent phlegmon/developing abscess. There is no evidence of hydronephrosis. A 2 cm indeterminate lesion extending off of the RIGHT UPPER renal pole (11:55-59) measures 52 Hounsfield units. No abnormal adjacent lymph nodes or renal vein thrombosis. There is fullness/nodularity of the LEFT adrenal gland. No bladder abnormalities are identified. No urinary calculi are noted. Stomach/Bowel: Stomach is within normal limits. Appendix appears normal. No evidence of bowel wall thickening, distention, or inflammatory changes. Colonic diverticulosis identified without evidence of acute diverticulitis. 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 Aortic atherosclerosis. No enlarged abdominal or pelvic lymph nodes. Reproductive: Status post hysterectomy. No adnexal masses. Other: There is no evidence of ascites or pneumoperitoneum. Musculoskeletal: No acute or suspicious bony abnormalities are noted. Review of the MIP images confirms the above findings.  IMPRESSION: 1. Ill-defined areas of decreased attenuation within the LEFT LOWER renal pole with moderate adjacent perinephric inflammation likely representing infection/pyelonephritis. Slightly more hypodense areas within/along the LOWER LEFT kidney  are indeterminate but may represent phlegmon/developing abscess. 2. 2 x 3 x 4 cm wedge-shaped area of decreased attenuation along the INFERIOR spleen, which may represent a parenchymal abnormality (infection/infarct) versus a focal subcapsular collection. 3. 2 cm indeterminate lesion extending off of the RIGHT UPPER kidney. Further evaluation with elective MRI is recommended. 4. Linear focal opacity at the LEFT lung base, slightly favor atelectasis over airspace disease/pneumonia. 5. Nondisplaced fractures of the anterior RIGHT 6th and 7th ribs, which appears acute to subacute. No evidence of pleural effusion or pneumothorax. 6. No evidence of pulmonary emboli. Enlarged central pulmonary arteries compatible with pulmonary arterial hypertension. 7. At least 4 very small (3-4 mm) hypodense lesions within the posterior RIGHT liver are noted, too small to characterize. 8. 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 9. Cardiomegaly and coronary artery disease. 10. Aortic Atherosclerosis (ICD10-I70.0). Electronically Signed   By: Margarette Canada M.D.   On: 07/14/2022 09:37   CT ABDOMEN PELVIS W CONTRAST  Result Date: 07/14/2022 CLINICAL DATA:  61 year old female with acute shortness of breath, abdominal and pelvic pain and possible sepsis. EXAM: CT ANGIOGRAPHY CHEST CT ABDOMEN AND PELVIS WITH CONTRAST TECHNIQUE: Multidetector CT imaging of the chest was performed using the standard protocol during bolus administration of intravenous contrast. Multiplanar CT image reconstructions and MIPs were obtained to evaluate the vascular anatomy. Multidetector CT imaging of the abdomen and pelvis was performed using the standard protocol during bolus administration of  intravenous contrast. RADIATION DOSE REDUCTION: This exam was performed according to the departmental dose-optimization program which includes automated exposure control, adjustment of the mA and/or kV according to patient size and/or use of iterative reconstruction technique. CONTRAST:  157mL OMNIPAQUE IOHEXOL 350 MG/ML SOLN COMPARISON:  None Available. FINDINGS: CTA CHEST FINDINGS Cardiovascular: This is a technically borderline study for evaluation of pulmonary emboli due to borderline contrast opacification and some respiratory/streak artifact. No pulmonary emboli are identified. Enlargement of the central pulmonary arteries noted with the main pulmonary artery measuring 4.1 cm in greatest diameter. Cardiomegaly is present. Coronary artery and aortic atherosclerotic calcifications are identified without evidence of thoracic aortic aneurysm or pericardial effusion. Mediastinum/Nodes: No enlarged mediastinal, hilar, or axillary lymph nodes. Thyroid gland, trachea, and esophagus demonstrate no significant findings. Lungs/Pleura: Linear confluent opacity at the LEFT lung base is noted, slightly favor atelectasis over pneumonia/infection. Dependent interstitial opacities bilaterally are noted and may represent atelectasis. No discrete mass, pneumothorax or pleural effusion identified. Musculoskeletal: Nondisplaced fractures of the anterior RIGHT 6th and 7th ribs are noted and appears acute to subacute. No other acute bony abnormalities or focal bony lesions are identified. Review of the MIP images confirms the above findings. CT ABDOMEN and PELVIS FINDINGS Hepatobiliary: At least 4 very small (3-4 mm) hypodense lesions within the posterior RIGHT liver are noted, too small to characterize. No other hepatic abnormalities are identified. The gallbladder is unremarkable. There is no evidence of intrahepatic or extrahepatic biliary dilatation. Pancreas: Unremarkable Spleen: A 2 x 3 x 4 cm wedge-shaped area of decreased  attenuation along the INFERIOR spleen is noted and may represent a parenchymal abnormality versus a focal subcapsular collection. No other splenic abnormalities are identified. Adrenals/Urinary Tract: There are ill-defined areas of decreased attenuation within the INFERIOR LEFT kidney with moderate adjacent perinephric inflammation suspicious for infection/pyelonephritis. Slightly more hypodense areas are noted, 1 measuring 1.5 cm within the LOWER LEFT kidney (series 11: Image 78) and a 1.5 x 2.5 cm subcapsular area(11:83) which may represent phlegmon/developing abscess. There is  no evidence of hydronephrosis. A 2 cm indeterminate lesion extending off of the RIGHT UPPER renal pole (11:55-59) measures 52 Hounsfield units. No abnormal adjacent lymph nodes or renal vein thrombosis. There is fullness/nodularity of the LEFT adrenal gland. No bladder abnormalities are identified. No urinary calculi are noted. Stomach/Bowel: Stomach is within normal limits. Appendix appears normal. No evidence of bowel wall thickening, distention, or inflammatory changes. Colonic diverticulosis identified without evidence of acute diverticulitis. 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 Aortic atherosclerosis. No enlarged abdominal or pelvic lymph nodes. Reproductive: Status post hysterectomy. No adnexal masses. Other: There is no evidence of ascites or pneumoperitoneum. Musculoskeletal: No acute or suspicious bony abnormalities are noted. Review of the MIP images confirms the above findings. IMPRESSION: 1. Ill-defined areas of decreased attenuation within the LEFT LOWER renal pole with moderate adjacent perinephric inflammation likely representing infection/pyelonephritis. Slightly more hypodense areas within/along the LOWER LEFT kidney are indeterminate but may represent phlegmon/developing abscess. 2. 2 x 3 x 4 cm wedge-shaped area of decreased attenuation along the INFERIOR  spleen, which may represent a parenchymal abnormality (infection/infarct) versus a focal subcapsular collection. 3. 2 cm indeterminate lesion extending off of the RIGHT UPPER kidney. Further evaluation with elective MRI is recommended. 4. Linear focal opacity at the LEFT lung base, slightly favor atelectasis over airspace disease/pneumonia. 5. Nondisplaced fractures of the anterior RIGHT 6th and 7th ribs, which appears acute to subacute. No evidence of pleural effusion or pneumothorax. 6. No evidence of pulmonary emboli. Enlarged central pulmonary arteries compatible with pulmonary arterial hypertension. 7. At least 4 very small (3-4 mm) hypodense lesions within the posterior RIGHT liver are noted, too small to characterize. 8. 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 9. Cardiomegaly and coronary artery disease. 10. Aortic Atherosclerosis (ICD10-I70.0). Electronically Signed   By: Harmon Pier M.D.   On: 07/14/2022 09:37   DG Chest Portable 1 View  Result Date: 07/14/2022 CLINICAL DATA:  61 year old female with history of shortness of breath and fever. EXAM: PORTABLE CHEST 1 VIEW COMPARISON:  No priors. FINDINGS: Lung volumes are slightly low. Ill-defined opacity in the left lung base which may reflect atelectasis and/or consolidation. No definite pleural effusions. No pneumothorax. Cephalization of the pulmonary vasculature, without frank pulmonary edema. Heart size is normal. Upper mediastinal contours are within normal limits. Atherosclerotic calcifications are noted in the thoracic aorta. IMPRESSION: 1. Ill-defined opacity in the left lung base which may reflect a small area of atelectasis and/or consolidation. 2. Cephalization of the pulmonary vasculature, without frank pulmonary edema. 3. Aortic atherosclerosis. Electronically Signed   By: Trudie Reed M.D.   On: 07/14/2022 06:55        Scheduled Meds:  ascorbic acid  500 mg Oral Daily   cholecalciferol  1,000 Units Oral  Daily   folic acid  1 mg Oral Daily   hydroxychloroquine  200 mg Oral BID   magnesium oxide  400 mg Oral Daily   [START ON 07/16/2022] methotrexate  20 mg Oral Q Mon   mometasone-formoterol  2 puff Inhalation BID   multivitamin with minerals  1 tablet Oral Daily   pantoprazole  40 mg Oral Daily   predniSONE  15 mg Oral Daily   sulfaSALAzine  500 mg Oral BID   Continuous Infusions:  sodium chloride 75 mL/hr at 07/15/22 0402   azithromycin 500 mg (07/14/22 1437)   cefTRIAXone (ROCEPHIN)  IV 2 g (07/15/22 0645)  LOS: 1 day   Tresa Moore, MD [facility]   If 7PM-7AM, please contact night-coverage  07/15/2022, 11:04 AM

## 2022-07-16 ENCOUNTER — Telehealth: Payer: Self-pay

## 2022-07-16 ENCOUNTER — Encounter: Payer: Self-pay | Admitting: Internal Medicine

## 2022-07-16 DIAGNOSIS — N10 Acute tubulo-interstitial nephritis: Secondary | ICD-10-CM | POA: Diagnosis not present

## 2022-07-16 DIAGNOSIS — J449 Chronic obstructive pulmonary disease, unspecified: Secondary | ICD-10-CM

## 2022-07-16 LAB — LEGIONELLA PNEUMOPHILA SEROGP 1 UR AG: L. pneumophila Serogp 1 Ur Ag: NEGATIVE

## 2022-07-16 LAB — URINE CULTURE: Culture: >=100000 — AB

## 2022-07-16 LAB — GLUCOSE, CAPILLARY: Glucose-Capillary: 121 mg/dL — ABNORMAL HIGH (ref 70–99)

## 2022-07-16 MED ORDER — CIPROFLOXACIN HCL 500 MG PO TABS: 500.0000 mg | ORAL_TABLET | Freq: Two times a day (BID) | ORAL | 0 refills | Status: AC

## 2022-07-16 NOTE — Discharge Summary (Signed)
Physician Discharge Summary  Kelly Trujillo ZDG:[id number] DOB: [birth year] DOA: 07/14/2022  PCP: Patterson Hammersmith, MD  Admit date: 07/14/2022 Discharge date: 07/16/2022  Admitted From: Home Disposition: Home  Recommendations for Outpatient Follow-up:  Follow up with PCP in 1-2 weeks Discussed CT findings with primary care physician  Home Health: No Equipment/Devices: Oxygen 2 L via nasal cannula  Discharge Condition: Stable CODE STATUS: Full Diet recommendation: Regular  Brief/Interim Summary: 61 y.o. female with medical history significant of COPD and remote tobacco abuse chronically on 2 L nasal cannula during the day and BiPAP at night, HTN, sciatica, dCHF, rheumatoid arthritis on methotrexate and hydroxychloroquine, who presents with fever, chills, and urinary frequency.   Patient states that she has not been feeling well for 3 days.  She has fever, chills, body aches.  She has increased urinary frequency and burning on urination, no dysuria or hematuria. Patient states she saw her oxygen desaturation down to 85% on her home level of 2 L oxygen. She has to increase her oxygen to 3 L this AM.  She denies chest pain and shortness of breath and cough.  No nausea, vomiting, diarrhea or abdominal pain.     CT findings reviewed in detail with the patient.  At this point will treat complicated UTI/pyelonephritis with IV antibiotics and intravenous fluids.  Remainder of findings on CT will be discussed between patient and her primary care physician    Discharge Diagnoses:  Principal Problem:   Acute pyelonephritis Active Problems:   Sepsis (HCC)   CAP (community acquired pneumonia)   COPD (chronic obstructive pulmonary disease) (HCC)   HTN (hypertension)   Myocardial injury   RA (rheumatoid arthritis) (HCC)   Chronic diastolic CHF (congestive heart failure) (HCC)   Right rib fracture   Lesion of spleen   Liver lesion   Positive D dimer   Kidney lesion   Obesity, Class III,  BMI 40-49.9 (morbid obesity) (HCC)  Acute pyelonephritis Sepsis due to acute pyelonephritis E. coli bacteremia secondary to above Patient meets criteria for sepsis with WBC 12.1, heart rate of 102, lactic acid is normal at 0.8.  Currently hemodynamically stable.  Sepsis physiology resolved at time of discharge.  E. coli on culture pansensitive. Plan: Discharge home.  Prescribed additional 11 days of ciprofloxacin 500 mg twice daily to complete total 2-week antibiotic course.  Elected to treat on the longer side considering immunosuppressive state.  Follow-up outpatient PCP.   CAP (community acquired pneumonia) Possible CAP, ruled out Although patient does not have symptoms of pneumonia, patient has increased oxygen requirement from 2 to 3 L.  No respiratory distress.  Chest x-ray showed possible left basilar opacity.  CTA negative for PE, but showed left lower lobe opacity, indicating possible pneumonia.  Felt unlikely.  Pneumonia ruled out Plan: Azithromycin discontinued   Chronic hypoxic respiratory failure Patient was briefly on 3 L nasal cannula, now back to 2 L home rate Continue oxygen via nasal cannula Saturation greater than 88%     COPD (chronic obstructive pulmonary disease) (HCC) No wheezing on auscultation -Bronchodilators   HTN (hypertension) - IV hydralazine as needed -Can resume home Lasix at time of DC   Myocardial injury Myocardial injury due to sepsis and ongoing infection. Troponin 18 --> 19    RA (rheumatoid arthritis) (HCC) - Continue home methotrexate, Plaquenil, sulfasalazine Can resume prednisone 5 mg daily   Chronic diastolic CHF (congestive heart failure) (HCC) 2D echo on 07/11/2022 showed EF> 55%.  Patient has trace leg edema, no  pulm edema chest x-ray, BNP 156, does not seem to have CHF exacerbation. Can resume Lasix at time of discharge   Right rib fracture - Pain control: As needed oxycodone, morphine -Incentive spirometry   Lesion of  spleen CTA showed a 2 x 3 x 4 cm wedge-shaped area of decreased attenuation along the INFERIOR spleen, which may represent a parenchymal abnormality (infection/infarct) versus a focal subcapsular collection. Unclear etiology. -I discussed with patient about these findings.  Recommend patient to follow-up with his PCP as outpatient to repeat CT scan.  She agreed to do so   Liver lesion CTA showed at least 4 very small (3-4 mm) hypodense lesions within the posterior RIGHT liver are noted, too small to characterize. -I recommend patient to follow-up with PCP to repeat image as outpatient   Kidney lesion CTA showed a 2 cm indeterminate lesion extending off of the RIGHT UPPER kidney. Further evaluation with elective MRI is recommended. -I recommended patient to follow-up with PCP for outpatient image   Positive D dimer LE doppler showed: 1. No DVT identified in the lower extremities. 2. 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   Morbid obesity BMI 42.06 This complicates overall care and prognosis  Discharge Instructions  Discharge Instructions     Diet - low sodium heart healthy   Complete by: As directed    Increase activity slowly   Complete by: As directed       Allergies as of 07/16/2022       Reactions   Shellfish Allergy Swelling, Other (See Comments)   Swelling of lips, no swelling of tongue or difficulty breathing. Unknown Unknown Swelling of lips, no swelling of tongue or difficulty breathing. Unknown   Other Other (See Comments)   Sulfa Antibiotics Nausea Only        Medication List     STOP taking these medications    HYDROcodone-acetaminophen 5-325 MG tablet Commonly known as: NORCO/VICODIN   methylPREDNISolone 4 MG Tbpk tablet Commonly known as: MEDROL DOSEPAK       TAKE these medications    acetaminophen 325 MG tablet Commonly known as: TYLENOL Take 650 mg by mouth every 6 (six) hours as needed for mild  pain or moderate pain.   albuterol 108 (90 Base) MCG/ACT inhaler Commonly known as: VENTOLIN HFA Inhale 1-2 puffs into the lungs every 6 (six) hours as needed for wheezing or shortness of breath.   ascorbic acid 500 MG tablet Commonly known as: VITAMIN C Take 500 mg by mouth daily.   cholecalciferol 25 MCG (1000 UNIT) tablet Commonly known as: VITAMIN D3 Take 1,000 Units by mouth daily.   ciprofloxacin 500 MG tablet Commonly known as: Cipro Take 1 tablet (500 mg total) by mouth 2 (two) times daily for 11 days. Start taking on: July 17, 2022   cyclobenzaprine 5 MG tablet Commonly known as: FLEXERIL Take 5 mg by mouth 3 (three) times daily as needed for muscle spasms.   fluticasone-salmeterol 230-21 MCG/ACT inhaler Commonly known as: ADVAIR HFA Inhale 2 puffs into the lungs daily.   folic acid 1 MG tablet Commonly known as: FOLVITE Take 1 mg by mouth daily.   furosemide 40 MG tablet Commonly known as: LASIX Take 40 mg by mouth daily as needed for fluid or edema.   hydroxychloroquine 200 MG tablet Commonly known as: PLAQUENIL Take 200 mg by mouth 2 (two) times daily.   magnesium oxide 400 MG tablet Commonly known as: MAG-OX  Take 400 mg by mouth daily.   meloxicam 15 MG tablet Commonly known as: MOBIC Take 15 mg by mouth daily as needed for pain.   methotrexate 2.5 MG tablet Commonly known as: RHEUMATREX Take 20 mg by mouth every Monday.   multivitamin with minerals tablet Take 1 tablet by mouth daily.   omeprazole 20 MG capsule Commonly known as: PRILOSEC Take 20 mg by mouth See admin instructions. Take 1 capsule (20mg ) by mouth every day Tuesday through Sunday - do not take on Monday   oxyCODONE 5 MG immediate release tablet Commonly known as: Oxy IR/ROXICODONE Take 5 mg by mouth every 12 (twelve) hours as needed for severe pain.   potassium chloride SA 20 MEQ tablet Commonly known as: KLOR-CON M Take 20 mEq by mouth daily.   predniSONE 5 MG  tablet Commonly known as: DELTASONE Take 5 mg by mouth daily.   sulfaSALAzine 500 MG EC tablet Commonly known as: AZULFIDINE Take 500 mg by mouth 2 (two) times daily.   Tiotropium Bromide Monohydrate 2.5 MCG/ACT Aers Inhale 2 each into the lungs daily.        Allergies  Allergen Reactions   Shellfish Allergy Swelling and Other (See Comments)    Swelling of lips, no swelling of tongue or difficulty breathing. Unknown Unknown Swelling of lips, no swelling of tongue or difficulty breathing.  Unknown   Other Other (See Comments)   Sulfa Antibiotics Nausea Only    Consultations: None   Procedures/Studies: Friday Venous Img Lower Bilateral (DVT)  Result Date: 07/14/2022 CLINICAL DATA:  Positive D-dimer.  Assess for DVT. EXAM: BILATERAL LOWER EXTREMITY VENOUS DOPPLER ULTRASOUND TECHNIQUE: Gray-scale sonography with compression, as well as color and duplex ultrasound, were performed to evaluate the deep venous system(s) from the level of the common femoral vein through the popliteal and proximal calf veins. COMPARISON:  None Available. FINDINGS: VENOUS Normal compressibility of the common femoral, superficial femoral, and popliteal veins, as well as the visualized calf veins. Visualized portions of profunda femoral vein and great saphenous vein unremarkable. No filling defects to suggest DVT on grayscale or color Doppler imaging. Doppler waveforms show normal direction of venous flow, normal respiratory plasticity and response to augmentation. Limited views of the contralateral common femoral vein are unremarkable. OTHER A fluid collection is identified in the right popliteal fossa measuring 5.3 x 1.2 x 3.2 cm Limitations: none IMPRESSION: 1. No DVT identified in the lower extremities. 2. 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 Electronically Signed   By: 07/16/2022 III M.D.   On: 07/14/2022 13:11   CT Angio Chest PE W and/or Wo Contrast  Result  Date: 07/14/2022 CLINICAL DATA:  61 year old female with acute shortness of breath, abdominal and pelvic pain and possible sepsis. EXAM: CT ANGIOGRAPHY CHEST CT ABDOMEN AND PELVIS WITH CONTRAST TECHNIQUE: Multidetector CT imaging of the chest was performed using the standard protocol during bolus administration of intravenous contrast. Multiplanar CT image reconstructions and MIPs were obtained to evaluate the vascular anatomy. Multidetector CT imaging of the abdomen and pelvis was performed using the standard protocol during bolus administration of intravenous contrast. RADIATION DOSE REDUCTION: This exam was performed according to the departmental dose-optimization program which includes automated exposure control, adjustment of the mA and/or kV according to patient size and/or use of iterative reconstruction technique. CONTRAST:  68 OMNIPAQUE IOHEXOL 350 MG/ML SOLN COMPARISON:  None Available. FINDINGS: CTA CHEST FINDINGS Cardiovascular: This is a technically borderline study for  evaluation of pulmonary emboli due to borderline contrast opacification and some respiratory/streak artifact. No pulmonary emboli are identified. Enlargement of the central pulmonary arteries noted with the main pulmonary artery measuring 4.1 cm in greatest diameter. Cardiomegaly is present. Coronary artery and aortic atherosclerotic calcifications are identified without evidence of thoracic aortic aneurysm or pericardial effusion. Mediastinum/Nodes: No enlarged mediastinal, hilar, or axillary lymph nodes. Thyroid gland, trachea, and esophagus demonstrate no significant findings. Lungs/Pleura: Linear confluent opacity at the LEFT lung base is noted, slightly favor atelectasis over pneumonia/infection. Dependent interstitial opacities bilaterally are noted and may represent atelectasis. No discrete mass, pneumothorax or pleural effusion identified. Musculoskeletal: Nondisplaced fractures of the anterior RIGHT 6th and 7th ribs are noted  and appears acute to subacute. No other acute bony abnormalities or focal bony lesions are identified. Review of the MIP images confirms the above findings. CT ABDOMEN and PELVIS FINDINGS Hepatobiliary: At least 4 very small (3-4 mm) hypodense lesions within the posterior RIGHT liver are noted, too small to characterize. No other hepatic abnormalities are identified. The gallbladder is unremarkable. There is no evidence of intrahepatic or extrahepatic biliary dilatation. Pancreas: Unremarkable Spleen: A 2 x 3 x 4 cm wedge-shaped area of decreased attenuation along the INFERIOR spleen is noted and may represent a parenchymal abnormality versus a focal subcapsular collection. No other splenic abnormalities are identified. Adrenals/Urinary Tract: There are ill-defined areas of decreased attenuation within the INFERIOR LEFT kidney with moderate adjacent perinephric inflammation suspicious for infection/pyelonephritis. Slightly more hypodense areas are noted, 1 measuring 1.5 cm within the LOWER LEFT kidney (series 11: Image 78) and a 1.5 x 2.5 cm subcapsular area(11:83) which may represent phlegmon/developing abscess. There is no evidence of hydronephrosis. A 2 cm indeterminate lesion extending off of the RIGHT UPPER renal pole (11:55-59) measures 52 Hounsfield units. No abnormal adjacent lymph nodes or renal vein thrombosis. There is fullness/nodularity of the LEFT adrenal gland. No bladder abnormalities are identified. No urinary calculi are noted. Stomach/Bowel: Stomach is within normal limits. Appendix appears normal. No evidence of bowel wall thickening, distention, or inflammatory changes. Colonic diverticulosis identified without evidence of acute diverticulitis. 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 Aortic atherosclerosis. No enlarged abdominal or pelvic lymph nodes. Reproductive: Status post hysterectomy. No adnexal masses. Other: There is no  evidence of ascites or pneumoperitoneum. Musculoskeletal: No acute or suspicious bony abnormalities are noted. Review of the MIP images confirms the above findings. IMPRESSION: 1. Ill-defined areas of decreased attenuation within the LEFT LOWER renal pole with moderate adjacent perinephric inflammation likely representing infection/pyelonephritis. Slightly more hypodense areas within/along the LOWER LEFT kidney are indeterminate but may represent phlegmon/developing abscess. 2. 2 x 3 x 4 cm wedge-shaped area of decreased attenuation along the INFERIOR spleen, which may represent a parenchymal abnormality (infection/infarct) versus a focal subcapsular collection. 3. 2 cm indeterminate lesion extending off of the RIGHT UPPER kidney. Further evaluation with elective MRI is recommended. 4. Linear focal opacity at the LEFT lung base, slightly favor atelectasis over airspace disease/pneumonia. 5. Nondisplaced fractures of the anterior RIGHT 6th and 7th ribs, which appears acute to subacute. No evidence of pleural effusion or pneumothorax. 6. No evidence of pulmonary emboli. Enlarged central pulmonary arteries compatible with pulmonary arterial hypertension. 7. At least 4 very small (3-4 mm) hypodense lesions within the posterior RIGHT liver are noted, too small to characterize. 8. 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 9. Cardiomegaly and coronary artery disease. 10.  Aortic Atherosclerosis (ICD10-I70.0). Electronically Signed   By: Harmon Pier M.D.   On: 07/14/2022 09:37   CT ABDOMEN PELVIS W CONTRAST  Result Date: 07/14/2022 CLINICAL DATA:  61 year old female with acute shortness of breath, abdominal and pelvic pain and possible sepsis. EXAM: CT ANGIOGRAPHY CHEST CT ABDOMEN AND PELVIS WITH CONTRAST TECHNIQUE: Multidetector CT imaging of the chest was performed using the standard protocol during bolus administration of intravenous contrast. Multiplanar CT image reconstructions and MIPs were  obtained to evaluate the vascular anatomy. Multidetector CT imaging of the abdomen and pelvis was performed using the standard protocol during bolus administration of intravenous contrast. RADIATION DOSE REDUCTION: This exam was performed according to the departmental dose-optimization program which includes automated exposure control, adjustment of the mA and/or kV according to patient size and/or use of iterative reconstruction technique. CONTRAST:  OMNIPAQUE IOHEXOL 350 MG/ML SOLN COMPARISON:  None Available. FINDINGS: CTA CHEST FINDINGS Cardiovascular: This is a technically borderline study for evaluation of pulmonary emboli due to borderline contrast opacification and some respiratory/streak artifact. No pulmonary emboli are identified. Enlargement of the central pulmonary arteries noted with the main pulmonary artery measuring 4.1 cm in greatest diameter. Cardiomegaly is present. Coronary artery and aortic atherosclerotic calcifications are identified without evidence of thoracic aortic aneurysm or pericardial effusion. Mediastinum/Nodes: No enlarged mediastinal, hilar, or axillary lymph nodes. Thyroid gland, trachea, and esophagus demonstrate no significant findings. Lungs/Pleura: Linear confluent opacity at the LEFT lung base is noted, slightly favor atelectasis over pneumonia/infection. Dependent interstitial opacities bilaterally are noted and may represent atelectasis. No discrete mass, pneumothorax or pleural effusion identified. Musculoskeletal: Nondisplaced fractures of the anterior RIGHT 6th and 7th ribs are noted and appears acute to subacute. No other acute bony abnormalities or focal bony lesions are identified. Review of the MIP images confirms the above findings. CT ABDOMEN and PELVIS FINDINGS Hepatobiliary: At least 4 very small (3-4 mm) hypodense lesions within the posterior RIGHT liver are noted, too small to characterize. No other hepatic abnormalities are identified. The gallbladder is  unremarkable. There is no evidence of intrahepatic or extrahepatic biliary dilatation. Pancreas: Unremarkable Spleen: A 2 x 3 x 4 cm wedge-shaped area of decreased attenuation along the INFERIOR spleen is noted and may represent a parenchymal abnormality versus a focal subcapsular collection. No other splenic abnormalities are identified. Adrenals/Urinary Tract: There are ill-defined areas of decreased attenuation within the INFERIOR LEFT kidney with moderate adjacent perinephric inflammation suspicious for infection/pyelonephritis. Slightly more hypodense areas are noted, 1 measuring 1.5 cm within the LOWER LEFT kidney (series 11: Image 78) and a 1.5 x 2.5 cm subcapsular area(11:83) which may represent phlegmon/developing abscess. There is no evidence of hydronephrosis. A 2 cm indeterminate lesion extending off of the RIGHT UPPER renal pole (11:55-59) measures 52 Hounsfield units. No abnormal adjacent lymph nodes or renal vein thrombosis. There is fullness/nodularity of the LEFT adrenal gland. No bladder abnormalities are identified. No urinary calculi are noted. Stomach/Bowel: Stomach is within normal limits. Appendix appears normal. No evidence of bowel wall thickening, distention, or inflammatory changes. Colonic diverticulosis identified without evidence of acute diverticulitis. 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 Aortic atherosclerosis. No enlarged abdominal or pelvic lymph nodes. Reproductive: Status post hysterectomy. No adnexal masses. Other: There is no evidence of ascites or pneumoperitoneum. Musculoskeletal: No acute or suspicious bony abnormalities are noted. Review of the MIP images confirms the above findings. IMPRESSION: 1. Ill-defined areas of decreased attenuation within the LEFT LOWER renal  pole with moderate adjacent perinephric inflammation likely representing infection/pyelonephritis. Slightly more hypodense areas within/along  the LOWER LEFT kidney are indeterminate but may represent phlegmon/developing abscess. 2. 2 x 3 x 4 cm wedge-shaped area of decreased attenuation along the INFERIOR spleen, which may represent a parenchymal abnormality (infection/infarct) versus a focal subcapsular collection. 3. 2 cm indeterminate lesion extending off of the RIGHT UPPER kidney. Further evaluation with elective MRI is recommended. 4. Linear focal opacity at the LEFT lung base, slightly favor atelectasis over airspace disease/pneumonia. 5. Nondisplaced fractures of the anterior RIGHT 6th and 7th ribs, which appears acute to subacute. No evidence of pleural effusion or pneumothorax. 6. No evidence of pulmonary emboli. Enlarged central pulmonary arteries compatible with pulmonary arterial hypertension. 7. At least 4 very small (3-4 mm) hypodense lesions within the posterior RIGHT liver are noted, too small to characterize. 8. 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 9. Cardiomegaly and coronary artery disease. 10. Aortic Atherosclerosis (ICD10-I70.0). Electronically Signed   By: Harmon Pier M.D.   On: 07/14/2022 09:37   DG Chest Portable 1 View  Result Date: 07/14/2022 CLINICAL DATA:  61 year old female with history of shortness of breath and fever. EXAM: PORTABLE CHEST 1 VIEW COMPARISON:  No priors. FINDINGS: Lung volumes are slightly low. Ill-defined opacity in the left lung base which may reflect atelectasis and/or consolidation. No definite pleural effusions. No pneumothorax. Cephalization of the pulmonary vasculature, without frank pulmonary edema. Heart size is normal. Upper mediastinal contours are within normal limits. Atherosclerotic calcifications are noted in the thoracic aorta. IMPRESSION: 1. Ill-defined opacity in the left lung base which may reflect a small area of atelectasis and/or consolidation. 2. Cephalization of the pulmonary vasculature, without frank pulmonary edema. 3. Aortic atherosclerosis.  Electronically Signed   By: Trudie Reed M.D.   On: 07/14/2022 06:55      Subjective: Seen and examined at time of discharge.  Stable no distress.  Sitting up in chair.  Eating breakfast.  Stable for discharge home.  Discharge Exam: Vitals:   07/16/22 0353 07/16/22 0741  BP: (!) 132/54 (!) 142/64  Pulse: 76 73  Resp: 18 16  Temp: 98.4 F (36.9 C) 98.3 F (36.8 C)  SpO2: 96% 96%   Vitals:   07/15/22 2023 07/16/22 0353 07/16/22 0500 07/16/22 0741  BP: (!) 119/58 (!) 132/54  (!) 142/64  Pulse: 64 76  73  Resp: Temp: 98.1 F (36.7 C) 98.4 F (36.9 C)  98.3 F (36.8 C)  TempSrc:      SpO2: 97% 96%  96%  Weight:   117.4 kg   Height:        General: Pt is alert, awake, not in acute distress Cardiovascular: RRR, S1/S2 +, no rubs, no gallops Respiratory: CTA bilaterally, no wheezing, no rhonchi Abdominal: Soft, NT, ND, bowel sounds + Extremities: no edema, no cyanosis    The results of significant diagnostics from this hospitalization (including imaging, microbiology, ancillary and laboratory) are listed below for reference.     Microbiology: Recent Results (from the past 240 hour(s))  Blood culture (routine x 2)     Status: Abnormal (Preliminary result)   Collection Time: 07/14/22  6:38 AM   Specimen: BLOOD  Result Value Ref Range Status   Specimen Description   Final    BLOOD RIGHT ANTECUBITAL Performed at [facility], [address] [place], [place] [address]    Special Requests   Final    BOTTLES  DRAWN AEROBIC AND ANAEROBIC Blood Culture results may not be optimal due to an excessive volume of blood received in culture bottles Performed at [facility], [address] [place], [place] [address]    Culture  Setup Time   Final    ANAEROBIC BOTTLE ONLY GRAM NEGATIVE RODS Organism ID to follow CRITICAL RESULT CALLED TO, READ BACK BY AND VERIFIED WITH: LISA KLUTTZ 1842 @ 07/14/22 [facility] GRAM STAIN REVIEWED-AGREE WITH RESULT     Culture (A)  Final    ESCHERICHIA COLI SUSCEPTIBILITIES TO FOLLOW Performed at [facility], [address], [place], [place] [address]    Report Status PENDING  Incomplete  Blood culture (routine x 2)     Status: None (Preliminary result)   Collection Time: 07/14/22  6:38 AM   Specimen: BLOOD  Result Value Ref Range Status   Specimen Description BLOOD LEFT ANTECUBITAL  Final   Special Requests   Final    BOTTLES DRAWN AEROBIC AND ANAEROBIC Blood Culture results may not be optimal due to an excessive volume of blood received in culture bottles   Culture   Final    NO GROWTH 2 DAYS Performed at [facility], [address] [place], [place] [address]    Report Status PENDING  Incomplete  Blood Culture ID Panel (Reflexed)     Status: Abnormal   Collection Time: 07/14/22  6:38 AM  Result Value Ref Range Status   Enterococcus faecalis NOT DETECTED NOT DETECTED Final   Enterococcus Faecium NOT DETECTED NOT DETECTED Final   Listeria monocytogenes NOT DETECTED NOT DETECTED Final   Staphylococcus species NOT DETECTED NOT DETECTED Final   Staphylococcus aureus (BCID) NOT DETECTED NOT DETECTED Final   Staphylococcus epidermidis NOT DETECTED NOT DETECTED Final   Staphylococcus lugdunensis NOT DETECTED NOT DETECTED Final   Streptococcus species NOT DETECTED NOT DETECTED Final   Streptococcus agalactiae NOT DETECTED NOT DETECTED Final   Streptococcus pneumoniae NOT DETECTED NOT DETECTED Final   Streptococcus pyogenes NOT DETECTED NOT DETECTED Final   A.calcoaceticus-baumannii NOT DETECTED NOT DETECTED Final   Bacteroides fragilis NOT DETECTED NOT DETECTED Final   Enterobacterales DETECTED (A) NOT DETECTED Final    Comment: Enterobacterales represent a large order of gram negative bacteria, not a single organism. CRITICAL RESULT CALLED TO, READ BACK BY AND VERIFIED WITH: LISA KLUTTZ @ 1844 07/14/22 LFD    Enterobacter cloacae complex NOT DETECTED NOT DETECTED Final    Escherichia coli DETECTED (A) NOT DETECTED Final    Comment: CRITICAL RESULT CALLED TO, READ BACK BY AND VERIFIED WITH: LISA KLUTTZ @ 1842 07/14/22 LFD    Klebsiella aerogenes NOT DETECTED NOT DETECTED Final   Klebsiella oxytoca NOT DETECTED NOT DETECTED Final   Klebsiella pneumoniae NOT DETECTED NOT DETECTED Final   Proteus species NOT DETECTED NOT DETECTED Final   Salmonella species NOT DETECTED NOT DETECTED Final   Serratia marcescens NOT DETECTED NOT DETECTED Final   Haemophilus influenzae NOT DETECTED NOT DETECTED Final   Neisseria meningitidis NOT DETECTED NOT DETECTED Final   Pseudomonas aeruginosa NOT DETECTED NOT DETECTED Final   Stenotrophomonas maltophilia NOT DETECTED NOT DETECTED Final   Candida albicans NOT DETECTED NOT DETECTED Final   Candida auris NOT DETECTED NOT DETECTED Final   Candida glabrata NOT DETECTED NOT DETECTED Final   Candida krusei NOT DETECTED NOT DETECTED Final   Candida parapsilosis NOT DETECTED NOT DETECTED Final   Candida tropicalis NOT DETECTED NOT DETECTED Final   Cryptococcus neoformans/gattii NOT DETECTED NOT DETECTED  Final   CTX-M ESBL NOT DETECTED NOT DETECTED Final   Carbapenem resistance IMP NOT DETECTED NOT DETECTED Final   Carbapenem resistance KPC NOT DETECTED NOT DETECTED Final   Carbapenem resistance NDM NOT DETECTED NOT DETECTED Final   Carbapenem resist OXA 48 LIKE NOT DETECTED NOT DETECTED Final   Carbapenem resistance VIM NOT DETECTED NOT DETECTED Final    Comment: Performed at [facility], [address] [place], [place] [address]  SARS Coronavirus 2 by RT PCR (hospital order, performed in [facility] hospital lab) *cepheid single result test* Anterior Nasal Swab     Status: None   Collection Time: 07/14/22  6:39 AM   Specimen: Anterior Nasal Swab  Result Value Ref Range Status   SARS Coronavirus 2 by RT PCR NEGATIVE NEGATIVE Final    Comment: (NOTE) SARS-CoV-2 target nucleic acids are NOT DETECTED.  The SARS-CoV-2  RNA is generally detectable in upper and lower respiratory specimens during the acute phase of infection. The lowest concentration of SARS-CoV-2 viral copies this assay can detect is 250 copies / mL. A negative result does not preclude SARS-CoV-2 infection and should not be used as the sole basis for treatment or other patient management decisions.  A negative result may occur with improper specimen collection / handling, submission of specimen other than nasopharyngeal swab, presence of viral mutation(s) within the areas targeted by this assay, and inadequate number of viral copies (<250 copies / mL). A negative result must be combined with clinical observations, patient history, and epidemiological information.  Fact Sheet for Patients:   RoadLapTop.co.za  Fact Sheet for Healthcare Providers: http://kim-miller.com/  This test is not yet approved or  cleared by the [place] FDA and has been authorized for detection and/or diagnosis of SARS-CoV-2 by FDA under an Emergency Use Authorization (EUA).  This EUA will remain in effect (meaning this test can be used) for the duration of the COVID-19 declaration under Section 564(b)(1) of the Act, 21 U.S.C. section 360bbb-3(b)(1), unless the authorization is terminated or revoked sooner.  Performed at [facility], [address] [place], [place] [address]   Urine Culture     Status: Abnormal   Collection Time: 07/14/22  6:59 AM   Specimen: Urine, Clean Catch  Result Value Ref Range Status   Specimen Description   Final    URINE, CLEAN CATCH Performed at [facility], [address] [place], [place] [address]    Special Requests   Final    NONE Performed at [facility], [address] [place], [place] [address]    Culture >=100,000 COLONIES/mL ESCHERICHIA COLI (A)  Final   Report Status 07/16/2022 FINAL  Final   Organism ID, Bacteria ESCHERICHIA COLI  (A)  Final      Susceptibility   Escherichia coli - MIC*    AMPICILLIN >=32 RESISTANT Resistant     CEFAZOLIN <=4 SENSITIVE Sensitive     CEFEPIME <=0.12 SENSITIVE Sensitive     CEFTRIAXONE <=0.25 SENSITIVE Sensitive     CIPROFLOXACIN <=0.25 SENSITIVE Sensitive     GENTAMICIN <=1 SENSITIVE Sensitive     IMIPENEM <=0.25 SENSITIVE Sensitive     NITROFURANTOIN <=16 SENSITIVE Sensitive     TRIMETH/SULFA >=320 RESISTANT Resistant     AMPICILLIN/SULBACTAM 16 INTERMEDIATE Intermediate     PIP/TAZO <=4 SENSITIVE Sensitive     * >=100,000 COLONIES/mL ESCHERICHIA COLI  Expectorated Sputum Assessment w Gram Stain, Rflx to Resp Cult     Status: None   Collection Time:  07/14/22  2:10 PM   Specimen: Urine, Random; Sputum  Result Value Ref Range Status   Specimen Description URINE, RANDOM  Final   Special Requests NONE  Final   Sputum evaluation   Final    Sputum specimen not acceptable for testing.  Please recollect.   NOTIFIED ALLIE HEISEY FOR RECOLLECT ON  07/14/22 AT 1522 QSD Performed at [facility], [address] [place], [place] [address]    Report Status 07/14/2022 FINAL  Final  Expectorated Sputum Assessment w Gram Stain, Rflx to Resp Cult     Status: None   Collection Time: 07/14/22  6:19 PM   Specimen: Sputum  Result Value Ref Range Status   Specimen Description SPUTUM  Final   Special Requests Normal  Final   Sputum evaluation   Final    THIS SPECIMEN IS ACCEPTABLE FOR SPUTUM CULTURE Performed at [facility], [address], [place], [place] [address]    Report Status 07/14/2022 FINAL  Final  Culture, Respiratory w Gram Stain     Status: None (Preliminary result)   Collection Time: 07/14/22  6:19 PM   Specimen: SPU  Result Value Ref Range Status   Specimen Description   Final    SPUTUM Performed at [facility], [address], [place], [place] [address]    Special Requests   Final    Normal Reflexed from [phone] Performed at [facility], [address] [place], [place] [address]    Gram Stain   Final    NO SQUAMOUS EPITHELIAL CELLS SEEN WBC SEEN FEW GRAM POSITIVE RODS FEW GRAM NEGATIVE RODS FEW GRAM POSITIVE COCCI    Culture   Final    CULTURE REINCUBATED FOR BETTER GROWTH Performed at [facility], [address], [place], [place] [address]    Report Status PENDING  Incomplete     Labs: BNP (last 3 results) Recent Labs    07/14/22 0638  BNP 156.4*   Basic Metabolic Panel: Recent Labs  Lab 07/14/22 0638 07/15/22 0542  NA 138 138  K 4.2 4.0  CL 106 106  CO2 24 25  GLUCOSE 180* 108*  BUN 25* 16  CREATININE 0.85 0.73  CALCIUM 8.6* 8.5*   Liver Function Tests: Recent Labs  Lab 07/14/22 0638  AST 50*  ALT 56*  ALKPHOS 185*  BILITOT 1.2  PROT 7.0  ALBUMIN 3.1*   No results for input(s): "LIPASE", "AMYLASE" in the last 168 hours. No results for input(s): "AMMONIA" in the last 168 hours. CBC: Recent Labs  Lab 07/14/22 0638 07/15/22 0542  WBC 12.1* 10.3  NEUTROABS 10.0*  --   HGB 10.9* 9.4*  HCT 34.5* 30.3*  MCV 100.3* 101.0*  PLT 252 241   Cardiac Enzymes: No results for input(s): "CKTOTAL", "CKMB", "CKMBINDEX", "TROPONINI" in the last 168 hours. BNP: Invalid input(s): "POCBNP" CBG: Recent Labs  Lab 07/16/22 0847  GLUCAP 121*   D-Dimer Recent Labs    07/14/22 0654  DDIMER 3.24*   Hgb A1c Recent Labs    07/14/22 1037  HGBA1C 6.3*   Lipid Profile No results for input(s): "CHOL", "HDL", "LDLCALC", "TRIG", "CHOLHDL", "LDLDIRECT" in the last 72 hours. Thyroid function studies No results for input(s): "TSH", "T4TOTAL", "T3FREE", "THYROIDAB" in the last 72 hours.  Invalid input(s): "FREET3" Anemia work up No results for input(s): "VITAMINB12", "FOLATE", "FERRITIN", "TIBC", "IRON", "RETICCTPCT" in the last 72 hours. Urinalysis    Component Value Date/Time   COLORURINE AMBER (A) 07/14/2022 1840  APPEARANCEUR CLOUDY (A) 07/14/2022 0659   LABSPEC  1.016 07/14/2022 0659   PHURINE 6.0 07/14/2022 0659   GLUCOSEU NEGATIVE 07/14/2022 0659   HGBUR SMALL (A) 07/14/2022 0659   BILIRUBINUR NEGATIVE 07/14/2022 0659   KETONESUR NEGATIVE 07/14/2022 0659   PROTEINUR 100 (A) 07/14/2022 0659   NITRITE NEGATIVE 07/14/2022 0659   LEUKOCYTESUR LARGE (A) 07/14/2022 0659   Sepsis Labs Recent Labs  Lab 07/14/22 0638 07/15/22 0542  WBC 12.1* 10.3   Microbiology Recent Results (from the past 240 hour(s))  Blood culture (routine x 2)     Status: Abnormal (Preliminary result)   Collection Time: 07/14/22  6:38 AM   Specimen: BLOOD  Result Value Ref Range Status   Specimen Description   Final    BLOOD RIGHT ANTECUBITAL Performed at [facility], [address] [place], [place] [address]    Special Requests   Final    BOTTLES DRAWN AEROBIC AND ANAEROBIC Blood Culture results may not be optimal due to an excessive volume of blood received in culture bottles Performed at [facility], [address] [place], [place] [address]    Culture  Setup Time   Final    ANAEROBIC BOTTLE ONLY GRAM NEGATIVE RODS Organism ID to follow CRITICAL RESULT CALLED TO, READ BACK BY AND VERIFIED WITH: LISA KLUTTZ 1842 @ 07/14/22 LFD GRAM STAIN REVIEWED-AGREE WITH RESULT    Culture (A)  Final    ESCHERICHIA COLI SUSCEPTIBILITIES TO FOLLOW Performed at [facility], [address], [place], [place] [address]    Report Status PENDING  Incomplete  Blood culture (routine x 2)     Status: None (Preliminary result)   Collection Time: 07/14/22  6:38 AM   Specimen: BLOOD  Result Value Ref Range Status   Specimen Description BLOOD LEFT ANTECUBITAL  Final   Special Requests   Final    BOTTLES DRAWN AEROBIC AND ANAEROBIC Blood Culture results may not be optimal due to an excessive volume of blood received in culture bottles   Culture   Final    NO GROWTH 2 DAYS Performed at [facility], [address] [place], [place] [address]     Report Status PENDING  Incomplete  Blood Culture ID Panel (Reflexed)     Status: Abnormal   Collection Time: 07/14/22  6:38 AM  Result Value Ref Range Status   Enterococcus faecalis NOT DETECTED NOT DETECTED Final   Enterococcus Faecium NOT DETECTED NOT DETECTED Final   Listeria monocytogenes NOT DETECTED NOT DETECTED Final   Staphylococcus species NOT DETECTED NOT DETECTED Final   Staphylococcus aureus (BCID) NOT DETECTED NOT DETECTED Final   Staphylococcus epidermidis NOT DETECTED NOT DETECTED Final   Staphylococcus lugdunensis NOT DETECTED NOT DETECTED Final   Streptococcus species NOT DETECTED NOT DETECTED Final   Streptococcus agalactiae NOT DETECTED NOT DETECTED Final   Streptococcus pneumoniae NOT DETECTED NOT DETECTED Final   Streptococcus pyogenes NOT DETECTED NOT DETECTED Final   A.calcoaceticus-baumannii NOT DETECTED NOT DETECTED Final   Bacteroides fragilis NOT DETECTED NOT DETECTED Final   Enterobacterales DETECTED (A) NOT DETECTED Final    Comment: Enterobacterales represent a large order of gram negative bacteria, not a single organism. CRITICAL RESULT CALLED TO, READ BACK BY AND VERIFIED WITH: LISA KLUTTZ @ 1844 07/14/22 LFD    Enterobacter cloacae complex NOT DETECTED NOT DETECTED Final   Escherichia coli DETECTED (A) NOT DETECTED Final    Comment: CRITICAL RESULT CALLED TO, READ BACK BY AND VERIFIED WITH: LISA KLUTTZ @  1842 07/14/22 LFD    Klebsiella aerogenes NOT DETECTED NOT DETECTED Final   Klebsiella oxytoca NOT DETECTED NOT DETECTED Final   Klebsiella pneumoniae NOT DETECTED NOT DETECTED Final   Proteus species NOT DETECTED NOT DETECTED Final   Salmonella species NOT DETECTED NOT DETECTED Final   Serratia marcescens NOT DETECTED NOT DETECTED Final   Haemophilus influenzae NOT DETECTED NOT DETECTED Final   Neisseria meningitidis NOT DETECTED NOT DETECTED Final   Pseudomonas aeruginosa NOT DETECTED NOT DETECTED Final   Stenotrophomonas maltophilia NOT  DETECTED NOT DETECTED Final   Candida albicans NOT DETECTED NOT DETECTED Final   Candida auris NOT DETECTED NOT DETECTED Final   Candida glabrata NOT DETECTED NOT DETECTED Final   Candida krusei NOT DETECTED NOT DETECTED Final   Candida parapsilosis NOT DETECTED NOT DETECTED Final   Candida tropicalis NOT DETECTED NOT DETECTED Final   Cryptococcus neoformans/gattii NOT DETECTED NOT DETECTED Final   CTX-M ESBL NOT DETECTED NOT DETECTED Final   Carbapenem resistance IMP NOT DETECTED NOT DETECTED Final   Carbapenem resistance KPC NOT DETECTED NOT DETECTED Final   Carbapenem resistance NDM NOT DETECTED NOT DETECTED Final   Carbapenem resist OXA 48 LIKE NOT DETECTED NOT DETECTED Final   Carbapenem resistance VIM NOT DETECTED NOT DETECTED Final    Comment: Performed at [facility], [address] [place], [place] [address]  SARS Coronavirus 2 by RT PCR (hospital order, performed in [facility] hospital lab) *cepheid single result test* Anterior Nasal Swab     Status: None   Collection Time: 07/14/22  6:39 AM   Specimen: Anterior Nasal Swab  Result Value Ref Range Status   SARS Coronavirus 2 by RT PCR NEGATIVE NEGATIVE Final    Comment: (NOTE) SARS-CoV-2 target nucleic acids are NOT DETECTED.  The SARS-CoV-2 RNA is generally detectable in upper and lower respiratory specimens during the acute phase of infection. The lowest concentration of SARS-CoV-2 viral copies this assay can detect is 250 copies / mL. A negative result does not preclude SARS-CoV-2 infection and should not be used as the sole basis for treatment or other patient management decisions.  A negative result may occur with improper specimen collection / handling, submission of specimen other than nasopharyngeal swab, presence of viral mutation(s) within the areas targeted by this assay, and inadequate number of viral copies (<250 copies / mL). A negative result must be combined with clinical observations, patient  history, and epidemiological information.  Fact Sheet for Patients:   RoadLapTop.co.za  Fact Sheet for Healthcare Providers: http://kim-miller.com/  This test is not yet approved or  cleared by the [place] FDA and has been authorized for detection and/or diagnosis of SARS-CoV-2 by FDA under an Emergency Use Authorization (EUA).  This EUA will remain in effect (meaning this test can be used) for the duration of the COVID-19 declaration under Section 564(b)(1) of the Act, 21 U.S.C. section 360bbb-3(b)(1), unless the authorization is terminated or revoked sooner.  Performed at [facility], [address] [place], [place] [address]   Urine Culture     Status: Abnormal   Collection Time: 07/14/22  6:59 AM   Specimen: Urine, Clean Catch  Result Value Ref Range Status   Specimen Description   Final    URINE, CLEAN CATCH Performed at [facility], [address] [place], [place] [address]    Special Requests   Final    NONE Performed at [facility], [address] [place], [place] [address]  Culture >=100,000 COLONIES/mL ESCHERICHIA COLI (A)  Final   Report Status 07/16/2022 FINAL  Final   Organism ID, Bacteria ESCHERICHIA COLI (A)  Final      Susceptibility   Escherichia coli - MIC*    AMPICILLIN >=32 RESISTANT Resistant     CEFAZOLIN <=4 SENSITIVE Sensitive     CEFEPIME <=0.12 SENSITIVE Sensitive     CEFTRIAXONE <=0.25 SENSITIVE Sensitive     CIPROFLOXACIN <=0.25 SENSITIVE Sensitive     GENTAMICIN <=1 SENSITIVE Sensitive     IMIPENEM <=0.25 SENSITIVE Sensitive     NITROFURANTOIN <=16 SENSITIVE Sensitive     TRIMETH/SULFA >=320 RESISTANT Resistant     AMPICILLIN/SULBACTAM 16 INTERMEDIATE Intermediate     PIP/TAZO <=4 SENSITIVE Sensitive     * >=100,000 COLONIES/mL ESCHERICHIA COLI  Expectorated Sputum Assessment w Gram Stain, Rflx to Resp Cult     Status: None   Collection Time: 07/14/22   2:10 PM   Specimen: Urine, Random; Sputum  Result Value Ref Range Status   Specimen Description URINE, RANDOM  Final   Special Requests NONE  Final   Sputum evaluation   Final    Sputum specimen not acceptable for testing.  Please recollect.   NOTIFIED ALLIE HEISEY FOR RECOLLECT ON  07/14/22 AT 1522 QSD Performed at [facility], [address] [place], [place] [address]    Report Status 07/14/2022 FINAL  Final  Expectorated Sputum Assessment w Gram Stain, Rflx to Resp Cult     Status: None   Collection Time: 07/14/22  6:19 PM   Specimen: Sputum  Result Value Ref Range Status   Specimen Description SPUTUM  Final   Special Requests Normal  Final   Sputum evaluation   Final    THIS SPECIMEN IS ACCEPTABLE FOR SPUTUM CULTURE Performed at [facility], [address], [place], [place] [address]    Report Status 07/14/2022 FINAL  Final  Culture, Respiratory w Gram Stain     Status: None (Preliminary result)   Collection Time: 07/14/22  6:19 PM   Specimen: SPU  Result Value Ref Range Status   Specimen Description   Final    SPUTUM Performed at [facility], [address] [place], [place] [address]    Special Requests   Final    Normal Reflexed from [phone] Performed at [facility], [address] [place], [place] [address]    Gram Stain   Final    NO SQUAMOUS EPITHELIAL CELLS SEEN WBC SEEN FEW GRAM POSITIVE RODS FEW GRAM NEGATIVE RODS FEW GRAM POSITIVE COCCI    Culture   Final    CULTURE REINCUBATED FOR BETTER GROWTH Performed at [facility], [address] [place], [place] [address]    Report Status PENDING  Incomplete     Time coordinating discharge: Over 30 minutes  SIGNED:   Tresa Moore, MD  [facility] 07/16/2022, 1:15 PM Pager   If 7PM-7AM, please contact night-coverage

## 2022-07-16 NOTE — Telephone Encounter (Signed)
Patient called to let us know she is in the hospital for pneumonia- not sure on discharge date. May need clearance to return- will keep [place] updated when she can come back.

## 2022-07-16 NOTE — Progress Notes (Deleted)
Physician Discharge Summary  Kelly Trujillo ZOX:[id number] DOB: [birth year] DOA: 07/14/2022  PCP: Patterson Hammersmith, MD  Admit date: 07/14/2022 Discharge date: 07/16/2022  Admitted From: Home Disposition: Home  Recommendations for Outpatient Follow-up:  Follow up with PCP in 1-2 weeks Discussed CT findings with primary care physician  Home Health: No Equipment/Devices: Oxygen 2 L via nasal cannula  Discharge Condition: Stable CODE STATUS: Full Diet recommendation: Regular  Brief/Interim Summary: 61 y.o. female with medical history significant of COPD and remote tobacco abuse chronically on 2 L nasal cannula during the day and BiPAP at night, HTN, sciatica, dCHF, rheumatoid arthritis on methotrexate and hydroxychloroquine, who presents with fever, chills, and urinary frequency.   Patient states that she has not been feeling well for 3 days.  She has fever, chills, body aches.  She has increased urinary frequency and burning on urination, no dysuria or hematuria. Patient states she saw her oxygen desaturation down to 85% on her home level of 2 L oxygen. She has to increase her oxygen to 3 L this AM.  She denies chest pain and shortness of breath and cough.  No nausea, vomiting, diarrhea or abdominal pain.     CT findings reviewed in detail with the patient.  At this point will treat complicated UTI/pyelonephritis with IV antibiotics and intravenous fluids.  Remainder of findings on CT will be discussed between patient and her primary care physician    Discharge Diagnoses:  Principal Problem:   Acute pyelonephritis Active Problems:   Sepsis (HCC)   CAP (community acquired pneumonia)   COPD (chronic obstructive pulmonary disease) (HCC)   HTN (hypertension)   Myocardial injury   RA (rheumatoid arthritis) (HCC)   Chronic diastolic CHF (congestive heart failure) (HCC)   Right rib fracture   Lesion of spleen   Liver lesion   Positive D dimer   Kidney lesion   Obesity, Class III, BMI  40-49.9 (morbid obesity) (HCC)  Acute pyelonephritis Sepsis due to acute pyelonephritis E. coli bacteremia secondary to above Patient meets criteria for sepsis with WBC 12.1, heart rate of 102, lactic acid is normal at 0.8.  Currently hemodynamically stable.  Sepsis physiology resolved at time of discharge.  E. coli on culture pansensitive. Plan: Discharge home.  Prescribed additional 11 days of ciprofloxacin 500 mg twice daily to complete total 2-week antibiotic course.  Elected to treat on the longer side considering immunosuppressive state.  Follow-up outpatient PCP.   CAP (community acquired pneumonia) Possible CAP, ruled out Although patient does not have symptoms of pneumonia, patient has increased oxygen requirement from 2 to 3 L.  No respiratory distress.  Chest x-ray showed possible left basilar opacity.  CTA negative for PE, but showed left lower lobe opacity, indicating possible pneumonia.  Felt unlikely.  Pneumonia ruled out Plan: Azithromycin discontinued   Chronic hypoxic respiratory failure Patient was briefly on 3 L nasal cannula, now back to 2 L home rate Continue oxygen via nasal cannula Saturation greater than 88%     COPD (chronic obstructive pulmonary disease) (HCC) No wheezing on auscultation -Bronchodilators   HTN (hypertension) - IV hydralazine as needed -Can resume home Lasix at time of DC   Myocardial injury Myocardial injury due to sepsis and ongoing infection. Troponin 18 --> 19    RA (rheumatoid arthritis) (HCC) - Continue home methotrexate, Plaquenil, sulfasalazine Can resume prednisone 5 mg daily   Chronic diastolic CHF (congestive heart failure) (HCC) 2D echo on 07/11/2022 showed EF> 55%.  Patient has trace leg edema, no  pulm edema chest x-ray, BNP 156, does not seem to have CHF exacerbation. Can resume Lasix at time of discharge   Right rib fracture - Pain control: As needed oxycodone, morphine -Incentive spirometry   Lesion of spleen CTA  showed a 2 x 3 x 4 cm wedge-shaped area of decreased attenuation along the INFERIOR spleen, which may represent a parenchymal abnormality (infection/infarct) versus a focal subcapsular collection. Unclear etiology. -I discussed with patient about these findings.  Recommend patient to follow-up with his PCP as outpatient to repeat CT scan.  She agreed to do so   Liver lesion CTA showed at least 4 very small (3-4 mm) hypodense lesions within the posterior RIGHT liver are noted, too small to characterize. -I recommend patient to follow-up with PCP to repeat image as outpatient   Kidney lesion CTA showed a 2 cm indeterminate lesion extending off of the RIGHT UPPER kidney. Further evaluation with elective MRI is recommended. -I recommended patient to follow-up with PCP for outpatient image   Positive D dimer LE doppler showed: 1. No DVT identified in the lower extremities. 2. 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   Morbid obesity BMI 42.06 This complicates overall care and prognosis  Discharge Instructions  Discharge Instructions     Diet - low sodium heart healthy   Complete by: As directed    Increase activity slowly   Complete by: As directed       Allergies as of 07/16/2022       Reactions   Shellfish Allergy Swelling, Other (See Comments)   Swelling of lips, no swelling of tongue or difficulty breathing. Unknown Unknown Swelling of lips, no swelling of tongue or difficulty breathing. Unknown   Other Other (See Comments)   Sulfa Antibiotics Nausea Only        Medication List     STOP taking these medications    HYDROcodone-acetaminophen 5-325 MG tablet Commonly known as: NORCO/VICODIN   methylPREDNISolone 4 MG Tbpk tablet Commonly known as: MEDROL DOSEPAK       TAKE these medications    acetaminophen 325 MG tablet Commonly known as: TYLENOL Take 650 mg by mouth every 6 (six) hours as needed for mild pain or  moderate pain.   albuterol 108 (90 Base) MCG/ACT inhaler Commonly known as: VENTOLIN HFA Inhale 1-2 puffs into the lungs every 6 (six) hours as needed for wheezing or shortness of breath.   ascorbic acid 500 MG tablet Commonly known as: VITAMIN C Take 500 mg by mouth daily.   cholecalciferol 25 MCG (1000 UNIT) tablet Commonly known as: VITAMIN D3 Take 1,000 Units by mouth daily.   ciprofloxacin 500 MG tablet Commonly known as: Cipro Take 1 tablet (500 mg total) by mouth 2 (two) times daily for 11 days. Start taking on: July 17, 2022   cyclobenzaprine 5 MG tablet Commonly known as: FLEXERIL Take 5 mg by mouth 3 (three) times daily as needed for muscle spasms.   fluticasone-salmeterol 230-21 MCG/ACT inhaler Commonly known as: ADVAIR HFA Inhale 2 puffs into the lungs daily.   folic acid 1 MG tablet Commonly known as: FOLVITE Take 1 mg by mouth daily.   furosemide 40 MG tablet Commonly known as: LASIX Take 40 mg by mouth daily as needed for fluid or edema.   hydroxychloroquine 200 MG tablet Commonly known as: PLAQUENIL Take 200 mg by mouth 2 (two) times daily.   magnesium oxide 400 MG tablet Commonly known as: MAG-OX  Take 400 mg by mouth daily.   meloxicam 15 MG tablet Commonly known as: MOBIC Take 15 mg by mouth daily as needed for pain.   methotrexate 2.5 MG tablet Commonly known as: RHEUMATREX Take 20 mg by mouth every Monday.   multivitamin with minerals tablet Take 1 tablet by mouth daily.   omeprazole 20 MG capsule Commonly known as: PRILOSEC Take 20 mg by mouth See admin instructions. Take 1 capsule ( ) by mouth every day Tuesday through Sunday - do not take on Monday   oxyCODONE 5 MG immediate release tablet Commonly known as: Oxy IR/ROXICODONE Take 5 mg by mouth every 12 (twelve) hours as needed for severe pain.   potassium chloride SA 20 MEQ tablet Commonly known as: KLOR-CON M Take 20 mEq by mouth daily.   predniSONE 5 MG tablet Commonly  known as: DELTASONE Take 5 mg by mouth daily.   sulfaSALAzine 500 MG EC tablet Commonly known as: AZULFIDINE Take 500 mg by mouth 2 (two) times daily.   Tiotropium Bromide Monohydrate 2.5 MCG/ACT Aers Inhale 2 each into the lungs daily.        Allergies  Allergen Reactions   Shellfish Allergy Swelling and Other (See Comments)    Swelling of lips, no swelling of tongue or difficulty breathing. Unknown Unknown Swelling of lips, no swelling of tongue or difficulty breathing.  Unknown   Other Other (See Comments)   Sulfa Antibiotics Nausea Only    Consultations: None   Procedures/Studies: US Venous Img Lower Bilateral (DVT)  Result Date: 07/14/2022 CLINICAL DATA:  Positive D-dimer.  Assess for DVT. EXAM: BILATERAL LOWER EXTREMITY VENOUS DOPPLER ULTRASOUND TECHNIQUE: Gray-scale sonography with compression, as well as color and duplex ultrasound, were performed to evaluate the deep venous system(s) from the level of the common femoral vein through the popliteal and proximal calf veins. COMPARISON:  None Available. FINDINGS: VENOUS Normal compressibility of the common femoral, superficial femoral, and popliteal veins, as well as the visualized calf veins. Visualized portions of profunda femoral vein and great saphenous vein unremarkable. No filling defects to suggest DVT on grayscale or color Doppler imaging. Doppler waveforms show normal direction of venous flow, normal respiratory plasticity and response to augmentation. Limited views of the contralateral common femoral vein are unremarkable. OTHER A fluid collection is identified in the right popliteal fossa measuring 5.3 x 1.2 x 3.2 cm Limitations: none IMPRESSION: 1. No DVT identified in the lower extremities. 2. 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 Electronically Signed   By: Gerome Sam III M.D.   On: 07/14/2022 13:11   CT Angio Chest PE W and/or Wo Contrast  Result Date:  07/14/2022 CLINICAL DATA:  60 year old female with acute shortness of breath, abdominal and pelvic pain and possible sepsis. EXAM: CT ANGIOGRAPHY CHEST CT ABDOMEN AND PELVIS WITH CONTRAST TECHNIQUE: Multidetector CT imaging of the chest was performed using the standard protocol during bolus administration of intravenous contrast. Multiplanar CT image reconstructions and MIPs were obtained to evaluate the vascular anatomy. Multidetector CT imaging of the abdomen and pelvis was performed using the standard protocol during bolus administration of intravenous contrast. RADIATION DOSE REDUCTION: This exam was performed according to the departmental dose-optimization program which includes automated exposure control, adjustment of the mA and/or kV according to patient size and/or use of iterative reconstruction technique. CONTRAST:  OMNIPAQUE IOHEXOL 350 MG/ML SOLN COMPARISON:  None Available. FINDINGS: CTA CHEST FINDINGS Cardiovascular: This is a technically borderline study for  evaluation of pulmonary emboli due to borderline contrast opacification and some respiratory/streak artifact. No pulmonary emboli are identified. Enlargement of the central pulmonary arteries noted with the main pulmonary artery measuring 4.1 cm in greatest diameter. Cardiomegaly is present. Coronary artery and aortic atherosclerotic calcifications are identified without evidence of thoracic aortic aneurysm or pericardial effusion. Mediastinum/Nodes: No enlarged mediastinal, hilar, or axillary lymph nodes. Thyroid gland, trachea, and esophagus demonstrate no significant findings. Lungs/Pleura: Linear confluent opacity at the LEFT lung base is noted, slightly favor atelectasis over pneumonia/infection. Dependent interstitial opacities bilaterally are noted and may represent atelectasis. No discrete mass, pneumothorax or pleural effusion identified. Musculoskeletal: Nondisplaced fractures of the anterior RIGHT 6th and 7th ribs are noted and  appears acute to subacute. No other acute bony abnormalities or focal bony lesions are identified. Review of the MIP images confirms the above findings. CT ABDOMEN and PELVIS FINDINGS Hepatobiliary: At least 4 very small (3-4 mm) hypodense lesions within the posterior RIGHT liver are noted, too small to characterize. No other hepatic abnormalities are identified. The gallbladder is unremarkable. There is no evidence of intrahepatic or extrahepatic biliary dilatation. Pancreas: Unremarkable Spleen: A 2 x 3 x 4 cm wedge-shaped area of decreased attenuation along the INFERIOR spleen is noted and may represent a parenchymal abnormality versus a focal subcapsular collection. No other splenic abnormalities are identified. Adrenals/Urinary Tract: There are ill-defined areas of decreased attenuation within the INFERIOR LEFT kidney with moderate adjacent perinephric inflammation suspicious for infection/pyelonephritis. Slightly more hypodense areas are noted, 1 measuring 1.5 cm within the LOWER LEFT kidney (series 11: Image 78) and a 1.5 x 2.5 cm subcapsular area(11:83) which may represent phlegmon/developing abscess. There is no evidence of hydronephrosis. A 2 cm indeterminate lesion extending off of the RIGHT UPPER renal pole (11:55-59) measures 52 Hounsfield units. No abnormal adjacent lymph nodes or renal vein thrombosis. There is fullness/nodularity of the LEFT adrenal gland. No bladder abnormalities are identified. No urinary calculi are noted. Stomach/Bowel: Stomach is within normal limits. Appendix appears normal. No evidence of bowel wall thickening, distention, or inflammatory changes. Colonic diverticulosis identified without evidence of acute diverticulitis. 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 Aortic atherosclerosis. No enlarged abdominal or pelvic lymph nodes. Reproductive: Status post hysterectomy. No adnexal masses. Other: There is no evidence  of ascites or pneumoperitoneum. Musculoskeletal: No acute or suspicious bony abnormalities are noted. Review of the MIP images confirms the above findings. IMPRESSION: 1. Ill-defined areas of decreased attenuation within the LEFT LOWER renal pole with moderate adjacent perinephric inflammation likely representing infection/pyelonephritis. Slightly more hypodense areas within/along the LOWER LEFT kidney are indeterminate but may represent phlegmon/developing abscess. 2. 2 x 3 x 4 cm wedge-shaped area of decreased attenuation along the INFERIOR spleen, which may represent a parenchymal abnormality (infection/infarct) versus a focal subcapsular collection. 3. 2 cm indeterminate lesion extending off of the RIGHT UPPER kidney. Further evaluation with elective MRI is recommended. 4. Linear focal opacity at the LEFT lung base, slightly favor atelectasis over airspace disease/pneumonia. 5. Nondisplaced fractures of the anterior RIGHT 6th and 7th ribs, which appears acute to subacute. No evidence of pleural effusion or pneumothorax. 6. No evidence of pulmonary emboli. Enlarged central pulmonary arteries compatible with pulmonary arterial hypertension. 7. At least 4 very small (3-4 mm) hypodense lesions within the posterior RIGHT liver are noted, too small to characterize. 8. 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 9. Cardiomegaly and coronary artery disease. 10.  Aortic Atherosclerosis (ICD10-I70.0). Electronically Signed   By: Harmon Pier M.D.   On: 07/14/2022 09:37   CT ABDOMEN PELVIS W CONTRAST  Result Date: 07/14/2022 CLINICAL DATA:  61 year old female with acute shortness of breath, abdominal and pelvic pain and possible sepsis. EXAM: CT ANGIOGRAPHY CHEST CT ABDOMEN AND PELVIS WITH CONTRAST TECHNIQUE: Multidetector CT imaging of the chest was performed using the standard protocol during bolus administration of intravenous contrast. Multiplanar CT image reconstructions and MIPs were obtained to  evaluate the vascular anatomy. Multidetector CT imaging of the abdomen and pelvis was performed using the standard protocol during bolus administration of intravenous contrast. RADIATION DOSE REDUCTION: This exam was performed according to the departmental dose-optimization program which includes automated exposure control, adjustment of the mA and/or kV according to patient size and/or use of iterative reconstruction technique. CONTRAST:  OMNIPAQUE IOHEXOL 350 MG/ML SOLN COMPARISON:  None Available. FINDINGS: CTA CHEST FINDINGS Cardiovascular: This is a technically borderline study for evaluation of pulmonary emboli due to borderline contrast opacification and some respiratory/streak artifact. No pulmonary emboli are identified. Enlargement of the central pulmonary arteries noted with the main pulmonary artery measuring 4.1 cm in greatest diameter. Cardiomegaly is present. Coronary artery and aortic atherosclerotic calcifications are identified without evidence of thoracic aortic aneurysm or pericardial effusion. Mediastinum/Nodes: No enlarged mediastinal, hilar, or axillary lymph nodes. Thyroid gland, trachea, and esophagus demonstrate no significant findings. Lungs/Pleura: Linear confluent opacity at the LEFT lung base is noted, slightly favor atelectasis over pneumonia/infection. Dependent interstitial opacities bilaterally are noted and may represent atelectasis. No discrete mass, pneumothorax or pleural effusion identified. Musculoskeletal: Nondisplaced fractures of the anterior RIGHT 6th and 7th ribs are noted and appears acute to subacute. No other acute bony abnormalities or focal bony lesions are identified. Review of the MIP images confirms the above findings. CT ABDOMEN and PELVIS FINDINGS Hepatobiliary: At least 4 very small (3-4 mm) hypodense lesions within the posterior RIGHT liver are noted, too small to characterize. No other hepatic abnormalities are identified. The gallbladder is  unremarkable. There is no evidence of intrahepatic or extrahepatic biliary dilatation. Pancreas: Unremarkable Spleen: A 2 x 3 x 4 cm wedge-shaped area of decreased attenuation along the INFERIOR spleen is noted and may represent a parenchymal abnormality versus a focal subcapsular collection. No other splenic abnormalities are identified. Adrenals/Urinary Tract: There are ill-defined areas of decreased attenuation within the INFERIOR LEFT kidney with moderate adjacent perinephric inflammation suspicious for infection/pyelonephritis. Slightly more hypodense areas are noted, 1 measuring 1.5 cm within the LOWER LEFT kidney (series 11: Image 78) and a 1.5 x 2.5 cm subcapsular area(11:83) which may represent phlegmon/developing abscess. There is no evidence of hydronephrosis. A 2 cm indeterminate lesion extending off of the RIGHT UPPER renal pole (11:55-59) measures 52 Hounsfield units. No abnormal adjacent lymph nodes or renal vein thrombosis. There is fullness/nodularity of the LEFT adrenal gland. No bladder abnormalities are identified. No urinary calculi are noted. Stomach/Bowel: Stomach is within normal limits. Appendix appears normal. No evidence of bowel wall thickening, distention, or inflammatory changes. Colonic diverticulosis identified without evidence of acute diverticulitis. 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 Aortic atherosclerosis. No enlarged abdominal or pelvic lymph nodes. Reproductive: Status post hysterectomy. No adnexal masses. Other: There is no evidence of ascites or pneumoperitoneum. Musculoskeletal: No acute or suspicious bony abnormalities are noted. Review of the MIP images confirms the above findings. IMPRESSION: 1. Ill-defined areas of decreased attenuation within the LEFT LOWER renal  pole with moderate adjacent perinephric inflammation likely representing infection/pyelonephritis. Slightly more hypodense areas within/along  the LOWER LEFT kidney are indeterminate but may represent phlegmon/developing abscess. 2. 2 x 3 x 4 cm wedge-shaped area of decreased attenuation along the INFERIOR spleen, which may represent a parenchymal abnormality (infection/infarct) versus a focal subcapsular collection. 3. 2 cm indeterminate lesion extending off of the RIGHT UPPER kidney. Further evaluation with elective MRI is recommended. 4. Linear focal opacity at the LEFT lung base, slightly favor atelectasis over airspace disease/pneumonia. 5. Nondisplaced fractures of the anterior RIGHT 6th and 7th ribs, which appears acute to subacute. No evidence of pleural effusion or pneumothorax. 6. No evidence of pulmonary emboli. Enlarged central pulmonary arteries compatible with pulmonary arterial hypertension. 7. At least 4 very small (3-4 mm) hypodense lesions within the posterior RIGHT liver are noted, too small to characterize. 8. 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 9. Cardiomegaly and coronary artery disease. 10. Aortic Atherosclerosis (ICD10-I70.0). Electronically Signed   By: Harmon Pier M.D.   On: 07/14/2022 09:37   DG Chest Portable 1 View  Result Date: 07/14/2022 CLINICAL DATA:  61 year old female with history of shortness of breath and fever. EXAM: PORTABLE CHEST 1 VIEW COMPARISON:  No priors. FINDINGS: Lung volumes are slightly low. Ill-defined opacity in the left lung base which may reflect atelectasis and/or consolidation. No definite pleural effusions. No pneumothorax. Cephalization of the pulmonary vasculature, without frank pulmonary edema. Heart size is normal. Upper mediastinal contours are within normal limits. Atherosclerotic calcifications are noted in the thoracic aorta. IMPRESSION: 1. Ill-defined opacity in the left lung base which may reflect a small area of atelectasis and/or consolidation. 2. Cephalization of the pulmonary vasculature, without frank pulmonary edema. 3. Aortic atherosclerosis.  Electronically Signed   By: Trudie Reed M.D.   On: 07/14/2022 06:55      Subjective: Seen and examined at time of discharge.  Stable no distress.  Sitting up in chair.  Eating breakfast.  Stable for discharge home.  Discharge Exam: Vitals:   07/16/22 0353 07/16/22 0741  BP: (!) 132/54 (!) 142/64  Pulse: 76 73  Resp: 18 16  Temp: 98.4 F (36.9 C) 98.3 F (36.8 C)  SpO2: 96% 96%   Vitals:   07/15/22 2023 07/16/22 0353 07/16/22 0500 07/16/22 0741  BP: (!) 119/58 (!) 132/54  (!) 142/64  Pulse: 64 76  73  Resp: Temp: 98.1 F (36.7 C) 98.4 F (36.9 C)  98.3 F (36.8 C)  TempSrc:      SpO2: 97% 96%  96%  Weight:   117.4 kg   Height:        General: Pt is alert, awake, not in acute distress Cardiovascular: RRR, S1/S2 +, no rubs, no gallops Respiratory: CTA bilaterally, no wheezing, no rhonchi Abdominal: Soft, NT, ND, bowel sounds + Extremities: no edema, no cyanosis    The results of significant diagnostics from this hospitalization (including imaging, microbiology, ancillary and laboratory) are listed below for reference.     Microbiology: Recent Results (from the past 240 hour(s))  Blood culture (routine x 2)     Status: Abnormal (Preliminary result)   Collection Time: 07/14/22  6:38 AM   Specimen: BLOOD  Result Value Ref Range Status   Specimen Description   Final    BLOOD RIGHT ANTECUBITAL Performed at [facility], [address] [place], [place] [address]    Special Requests   Final    BOTTLES  DRAWN AEROBIC AND ANAEROBIC Blood Culture results may not be optimal due to an excessive volume of blood received in culture bottles Performed at [facility], [address] [place], [place] [address]    Culture  Setup Time   Final    ANAEROBIC BOTTLE ONLY GRAM NEGATIVE RODS Organism ID to follow CRITICAL RESULT CALLED TO, READ BACK BY AND VERIFIED WITH: LISA KLUTTZ 1842 @ 07/14/22 [facility] GRAM STAIN REVIEWED-AGREE WITH RESULT     Culture (A)  Final    ESCHERICHIA COLI SUSCEPTIBILITIES TO FOLLOW Performed at [facility], [address], [place], [place] [address]    Report Status PENDING  Incomplete  Blood culture (routine x 2)     Status: None (Preliminary result)   Collection Time: 07/14/22  6:38 AM   Specimen: BLOOD  Result Value Ref Range Status   Specimen Description BLOOD LEFT ANTECUBITAL  Final   Special Requests   Final    BOTTLES DRAWN AEROBIC AND ANAEROBIC Blood Culture results may not be optimal due to an excessive volume of blood received in culture bottles   Culture   Final    NO GROWTH 2 DAYS Performed at [facility], [address] [place], [place] [address]    Report Status PENDING  Incomplete  Blood Culture ID Panel (Reflexed)     Status: Abnormal   Collection Time: 07/14/22  6:38 AM  Result Value Ref Range Status   Enterococcus faecalis NOT DETECTED NOT DETECTED Final   Enterococcus Faecium NOT DETECTED NOT DETECTED Final   Listeria monocytogenes NOT DETECTED NOT DETECTED Final   Staphylococcus species NOT DETECTED NOT DETECTED Final   Staphylococcus aureus (BCID) NOT DETECTED NOT DETECTED Final   Staphylococcus epidermidis NOT DETECTED NOT DETECTED Final   Staphylococcus lugdunensis NOT DETECTED NOT DETECTED Final   Streptococcus species NOT DETECTED NOT DETECTED Final   Streptococcus agalactiae NOT DETECTED NOT DETECTED Final   Streptococcus pneumoniae NOT DETECTED NOT DETECTED Final   Streptococcus pyogenes NOT DETECTED NOT DETECTED Final   A.calcoaceticus-baumannii NOT DETECTED NOT DETECTED Final   Bacteroides fragilis NOT DETECTED NOT DETECTED Final   Enterobacterales DETECTED (A) NOT DETECTED Final    Comment: Enterobacterales represent a large order of gram negative bacteria, not a single organism. CRITICAL RESULT CALLED TO, READ BACK BY AND VERIFIED WITH: LISA KLUTTZ @ 1844 07/14/22 LFD    Enterobacter cloacae complex NOT DETECTED NOT DETECTED Final    Escherichia coli DETECTED (A) NOT DETECTED Final    Comment: CRITICAL RESULT CALLED TO, READ BACK BY AND VERIFIED WITH: LISA KLUTTZ @ 1842 07/14/22 LFD    Klebsiella aerogenes NOT DETECTED NOT DETECTED Final   Klebsiella oxytoca NOT DETECTED NOT DETECTED Final   Klebsiella pneumoniae NOT DETECTED NOT DETECTED Final   Proteus species NOT DETECTED NOT DETECTED Final   Salmonella species NOT DETECTED NOT DETECTED Final   Serratia marcescens NOT DETECTED NOT DETECTED Final   Haemophilus influenzae NOT DETECTED NOT DETECTED Final   Neisseria meningitidis NOT DETECTED NOT DETECTED Final   Pseudomonas aeruginosa NOT DETECTED NOT DETECTED Final   Stenotrophomonas maltophilia NOT DETECTED NOT DETECTED Final   Candida albicans NOT DETECTED NOT DETECTED Final   Candida auris NOT DETECTED NOT DETECTED Final   Candida glabrata NOT DETECTED NOT DETECTED Final   Candida krusei NOT DETECTED NOT DETECTED Final   Candida parapsilosis NOT DETECTED NOT DETECTED Final   Candida tropicalis NOT DETECTED NOT DETECTED Final   Cryptococcus neoformans/gattii NOT DETECTED NOT DETECTED  Final   CTX-M ESBL NOT DETECTED NOT DETECTED Final   Carbapenem resistance IMP NOT DETECTED NOT DETECTED Final   Carbapenem resistance KPC NOT DETECTED NOT DETECTED Final   Carbapenem resistance NDM NOT DETECTED NOT DETECTED Final   Carbapenem resist OXA 48 LIKE NOT DETECTED NOT DETECTED Final   Carbapenem resistance VIM NOT DETECTED NOT DETECTED Final    Comment: Performed at [facility], [address] [place], [place] [address]  SARS Coronavirus 2 by RT PCR (hospital order, performed in [facility] hospital lab) *cepheid single result test* Anterior Nasal Swab     Status: None   Collection Time: 07/14/22  6:39 AM   Specimen: Anterior Nasal Swab  Result Value Ref Range Status   SARS Coronavirus 2 by RT PCR NEGATIVE NEGATIVE Final    Comment: (NOTE) SARS-CoV-2 target nucleic acids are NOT DETECTED.  The SARS-CoV-2  RNA is generally detectable in upper and lower respiratory specimens during the acute phase of infection. The lowest concentration of SARS-CoV-2 viral copies this assay can detect is 250 copies / mL. A negative result does not preclude SARS-CoV-2 infection and should not be used as the sole basis for treatment or other patient management decisions.  A negative result may occur with improper specimen collection / handling, submission of specimen other than nasopharyngeal swab, presence of viral mutation(s) within the areas targeted by this assay, and inadequate number of viral copies (<250 copies / mL). A negative result must be combined with clinical observations, patient history, and epidemiological information.  Fact Sheet for Patients:   RoadLapTop.co.za  Fact Sheet for Healthcare Providers: http://kim-miller.com/  This test is not yet approved or  cleared by the [place] FDA and has been authorized for detection and/or diagnosis of SARS-CoV-2 by FDA under an Emergency Use Authorization (EUA).  This EUA will remain in effect (meaning this test can be used) for the duration of the COVID-19 declaration under Section 564(b)(1) of the Act, 21 U.S.C. section 360bbb-3(b)(1), unless the authorization is terminated or revoked sooner.  Performed at [facility], [address] [place], [place] [address]   Urine Culture     Status: Abnormal   Collection Time: 07/14/22  6:59 AM   Specimen: Urine, Clean Catch  Result Value Ref Range Status   Specimen Description   Final    URINE, CLEAN CATCH Performed at [facility], [address] [place], [place] [address]    Special Requests   Final    NONE Performed at [facility], [address] [place], [place] [address]    Culture >=100,000 COLONIES/mL ESCHERICHIA COLI (A)  Final   Report Status 07/16/2022 FINAL  Final   Organism ID, Bacteria ESCHERICHIA COLI  (A)  Final      Susceptibility   Escherichia coli - MIC*    AMPICILLIN >=32 RESISTANT Resistant     CEFAZOLIN <=4 SENSITIVE Sensitive     CEFEPIME <=0.12 SENSITIVE Sensitive     CEFTRIAXONE <=0.25 SENSITIVE Sensitive     CIPROFLOXACIN <=0.25 SENSITIVE Sensitive     GENTAMICIN <=1 SENSITIVE Sensitive     IMIPENEM <=0.25 SENSITIVE Sensitive     NITROFURANTOIN <=16 SENSITIVE Sensitive     TRIMETH/SULFA >=320 RESISTANT Resistant     AMPICILLIN/SULBACTAM 16 INTERMEDIATE Intermediate     PIP/TAZO <=4 SENSITIVE Sensitive     * >=100,000 COLONIES/mL ESCHERICHIA COLI  Expectorated Sputum Assessment w Gram Stain, Rflx to Resp Cult     Status: None   Collection Time:  07/14/22  2:10 PM   Specimen: Urine, Random; Sputum  Result Value Ref Range Status   Specimen Description URINE, RANDOM  Final   Special Requests NONE  Final   Sputum evaluation   Final    Sputum specimen not acceptable for testing.  Please recollect.   NOTIFIED ALLIE HEISEY FOR RECOLLECT ON  07/14/22 AT 1522 QSD Performed at [facility], [address] [place], [place] [address]    Report Status 07/14/2022 FINAL  Final  Expectorated Sputum Assessment w Gram Stain, Rflx to Resp Cult     Status: None   Collection Time: 07/14/22  6:19 PM   Specimen: Sputum  Result Value Ref Range Status   Specimen Description SPUTUM  Final   Special Requests Normal  Final   Sputum evaluation   Final    THIS SPECIMEN IS ACCEPTABLE FOR SPUTUM CULTURE Performed at [facility], [address], [place], [place] [address]    Report Status 07/14/2022 FINAL  Final  Culture, Respiratory w Gram Stain     Status: None (Preliminary result)   Collection Time: 07/14/22  6:19 PM   Specimen: SPU  Result Value Ref Range Status   Specimen Description   Final    SPUTUM Performed at [facility], [address], [place], [place] [address]    Special Requests   Final    Normal Reflexed from [phone] Performed at [facility], [address] [place], [place] [address]    Gram Stain   Final    NO SQUAMOUS EPITHELIAL CELLS SEEN WBC SEEN FEW GRAM POSITIVE RODS FEW GRAM NEGATIVE RODS FEW GRAM POSITIVE COCCI    Culture   Final    CULTURE REINCUBATED FOR BETTER GROWTH Performed at [facility], [address], [place], [place] [address]    Report Status PENDING  Incomplete     Labs: BNP (last 3 results) Recent Labs    07/14/22 0638  BNP 156.4*   Basic Metabolic Panel: Recent Labs  Lab 07/14/22 0638 07/15/22 0542  NA 138 138  K 4.2 4.0  CL 106 106  CO2 24 25  GLUCOSE 180* 108*  BUN 25* 16  CREATININE 0.85 0.73  CALCIUM 8.6* 8.5*   Liver Function Tests: Recent Labs  Lab 07/14/22 0638  AST 50*  ALT 56*  ALKPHOS 185*  BILITOT 1.2  PROT 7.0  ALBUMIN 3.1*   No results for input(s): "LIPASE", "AMYLASE" in the last 168 hours. No results for input(s): "AMMONIA" in the last 168 hours. CBC: Recent Labs  Lab 07/14/22 0638 07/15/22 0542  WBC 12.1* 10.3  NEUTROABS 10.0*  --   HGB 10.9* 9.4*  HCT 34.5* 30.3*  MCV 100.3* 101.0*  PLT 252 241   Cardiac Enzymes: No results for input(s): "CKTOTAL", "CKMB", "CKMBINDEX", "TROPONINI" in the last 168 hours. BNP: Invalid input(s): "POCBNP" CBG: Recent Labs  Lab 07/16/22 0847  GLUCAP 121*   D-Dimer Recent Labs    07/14/22 0654  DDIMER 3.24*   Hgb A1c Recent Labs    07/14/22 1037  HGBA1C 6.3*   Lipid Profile No results for input(s): "CHOL", "HDL", "LDLCALC", "TRIG", "CHOLHDL", "LDLDIRECT" in the last 72 hours. Thyroid function studies No results for input(s): "TSH", "T4TOTAL", "T3FREE", "THYROIDAB" in the last 72 hours.  Invalid input(s): "FREET3" Anemia work up No results for input(s): "VITAMINB12", "FOLATE", "FERRITIN", "TIBC", "IRON", "RETICCTPCT" in the last 72 hours. Urinalysis    Component Value Date/Time   COLORURINE AMBER (A) 07/14/2022 1840  APPEARANCEUR CLOUDY (A) 07/14/2022 0659   LABSPEC  1.016 07/14/2022 0659   PHURINE 6.0 07/14/2022 0659   GLUCOSEU NEGATIVE 07/14/2022 0659   HGBUR SMALL (A) 07/14/2022 0659   BILIRUBINUR NEGATIVE 07/14/2022 0659   KETONESUR NEGATIVE 07/14/2022 0659   PROTEINUR 100 (A) 07/14/2022 0659   NITRITE NEGATIVE 07/14/2022 0659   LEUKOCYTESUR LARGE (A) 07/14/2022 0659   Sepsis Labs Recent Labs  Lab 07/14/22 0638 07/15/22 0542  WBC 12.1* 10.3   Microbiology Recent Results (from the past 240 hour(s))  Blood culture (routine x 2)     Status: Abnormal (Preliminary result)   Collection Time: 07/14/22  6:38 AM   Specimen: BLOOD  Result Value Ref Range Status   Specimen Description   Final    BLOOD RIGHT ANTECUBITAL Performed at [facility], [address] [place], [place] [address]    Special Requests   Final    BOTTLES DRAWN AEROBIC AND ANAEROBIC Blood Culture results may not be optimal due to an excessive volume of blood received in culture bottles Performed at [facility], [address] [place], [place] [address]    Culture  Setup Time   Final    ANAEROBIC BOTTLE ONLY GRAM NEGATIVE RODS Organism ID to follow CRITICAL RESULT CALLED TO, READ BACK BY AND VERIFIED WITH: LISA KLUTTZ 1842 @ 07/14/22 LFD GRAM STAIN REVIEWED-AGREE WITH RESULT    Culture (A)  Final    ESCHERICHIA COLI SUSCEPTIBILITIES TO FOLLOW Performed at [facility], [address], [place], [place] [address]    Report Status PENDING  Incomplete  Blood culture (routine x 2)     Status: None (Preliminary result)   Collection Time: 07/14/22  6:38 AM   Specimen: BLOOD  Result Value Ref Range Status   Specimen Description BLOOD LEFT ANTECUBITAL  Final   Special Requests   Final    BOTTLES DRAWN AEROBIC AND ANAEROBIC Blood Culture results may not be optimal due to an excessive volume of blood received in culture bottles   Culture   Final    NO GROWTH 2 DAYS Performed at [facility], [address] [place], [place] [address]     Report Status PENDING  Incomplete  Blood Culture ID Panel (Reflexed)     Status: Abnormal   Collection Time: 07/14/22  6:38 AM  Result Value Ref Range Status   Enterococcus faecalis NOT DETECTED NOT DETECTED Final   Enterococcus Faecium NOT DETECTED NOT DETECTED Final   Listeria monocytogenes NOT DETECTED NOT DETECTED Final   Staphylococcus species NOT DETECTED NOT DETECTED Final   Staphylococcus aureus (BCID) NOT DETECTED NOT DETECTED Final   Staphylococcus epidermidis NOT DETECTED NOT DETECTED Final   Staphylococcus lugdunensis NOT DETECTED NOT DETECTED Final   Streptococcus species NOT DETECTED NOT DETECTED Final   Streptococcus agalactiae NOT DETECTED NOT DETECTED Final   Streptococcus pneumoniae NOT DETECTED NOT DETECTED Final   Streptococcus pyogenes NOT DETECTED NOT DETECTED Final   A.calcoaceticus-baumannii NOT DETECTED NOT DETECTED Final   Bacteroides fragilis NOT DETECTED NOT DETECTED Final   Enterobacterales DETECTED (A) NOT DETECTED Final    Comment: Enterobacterales represent a large order of gram negative bacteria, not a single organism. CRITICAL RESULT CALLED TO, READ BACK BY AND VERIFIED WITH: LISA KLUTTZ @ 1844 07/14/22 LFD    Enterobacter cloacae complex NOT DETECTED NOT DETECTED Final   Escherichia coli DETECTED (A) NOT DETECTED Final    Comment: CRITICAL RESULT CALLED TO, READ BACK BY AND VERIFIED WITH: LISA KLUTTZ @  1842 07/14/22 LFD    Klebsiella aerogenes NOT DETECTED NOT DETECTED Final   Klebsiella oxytoca NOT DETECTED NOT DETECTED Final   Klebsiella pneumoniae NOT DETECTED NOT DETECTED Final   Proteus species NOT DETECTED NOT DETECTED Final   Salmonella species NOT DETECTED NOT DETECTED Final   Serratia marcescens NOT DETECTED NOT DETECTED Final   Haemophilus influenzae NOT DETECTED NOT DETECTED Final   Neisseria meningitidis NOT DETECTED NOT DETECTED Final   Pseudomonas aeruginosa NOT DETECTED NOT DETECTED Final   Stenotrophomonas maltophilia NOT  DETECTED NOT DETECTED Final   Candida albicans NOT DETECTED NOT DETECTED Final   Candida auris NOT DETECTED NOT DETECTED Final   Candida glabrata NOT DETECTED NOT DETECTED Final   Candida krusei NOT DETECTED NOT DETECTED Final   Candida parapsilosis NOT DETECTED NOT DETECTED Final   Candida tropicalis NOT DETECTED NOT DETECTED Final   Cryptococcus neoformans/gattii NOT DETECTED NOT DETECTED Final   CTX-M ESBL NOT DETECTED NOT DETECTED Final   Carbapenem resistance IMP NOT DETECTED NOT DETECTED Final   Carbapenem resistance KPC NOT DETECTED NOT DETECTED Final   Carbapenem resistance NDM NOT DETECTED NOT DETECTED Final   Carbapenem resist OXA 48 LIKE NOT DETECTED NOT DETECTED Final   Carbapenem resistance VIM NOT DETECTED NOT DETECTED Final    Comment: Performed at [facility], [address] [place], [place] [address]  SARS Coronavirus 2 by RT PCR (hospital order, performed in [facility] hospital lab) *cepheid single result test* Anterior Nasal Swab     Status: None   Collection Time: 07/14/22  6:39 AM   Specimen: Anterior Nasal Swab  Result Value Ref Range Status   SARS Coronavirus 2 by RT PCR NEGATIVE NEGATIVE Final    Comment: (NOTE) SARS-CoV-2 target nucleic acids are NOT DETECTED.  The SARS-CoV-2 RNA is generally detectable in upper and lower respiratory specimens during the acute phase of infection. The lowest concentration of SARS-CoV-2 viral copies this assay can detect is 250 copies / mL. A negative result does not preclude SARS-CoV-2 infection and should not be used as the sole basis for treatment or other patient management decisions.  A negative result may occur with improper specimen collection / handling, submission of specimen other than nasopharyngeal swab, presence of viral mutation(s) within the areas targeted by this assay, and inadequate number of viral copies (<250 copies / mL). A negative result must be combined with clinical observations, patient  history, and epidemiological information.  Fact Sheet for Patients:   RoadLapTop.co.za  Fact Sheet for Healthcare Providers: http://kim-miller.com/  This test is not yet approved or  cleared by the [place] FDA and has been authorized for detection and/or diagnosis of SARS-CoV-2 by FDA under an Emergency Use Authorization (EUA).  This EUA will remain in effect (meaning this test can be used) for the duration of the COVID-19 declaration under Section 564(b)(1) of the Act, 21 U.S.C. section 360bbb-3(b)(1), unless the authorization is terminated or revoked sooner.  Performed at [facility], [address] [place], [place] [address]   Urine Culture     Status: Abnormal   Collection Time: 07/14/22  6:59 AM   Specimen: Urine, Clean Catch  Result Value Ref Range Status   Specimen Description   Final    URINE, CLEAN CATCH Performed at [facility], [address] [place], [place] [address]    Special Requests   Final    NONE Performed at [facility], [address] [place], [place] [address]  Culture >=100,000 COLONIES/mL ESCHERICHIA COLI (A)  Final   Report Status 07/16/2022 FINAL  Final   Organism ID, Bacteria ESCHERICHIA COLI (A)  Final      Susceptibility   Escherichia coli - MIC*    AMPICILLIN >=32 RESISTANT Resistant     CEFAZOLIN <=4 SENSITIVE Sensitive     CEFEPIME <=0.12 SENSITIVE Sensitive     CEFTRIAXONE <=0.25 SENSITIVE Sensitive     CIPROFLOXACIN <=0.25 SENSITIVE Sensitive     GENTAMICIN <=1 SENSITIVE Sensitive     IMIPENEM <=0.25 SENSITIVE Sensitive     NITROFURANTOIN <=16 SENSITIVE Sensitive     TRIMETH/SULFA >=320 RESISTANT Resistant     AMPICILLIN/SULBACTAM 16 INTERMEDIATE Intermediate     PIP/TAZO <=4 SENSITIVE Sensitive     * >=100,000 COLONIES/mL ESCHERICHIA COLI  Expectorated Sputum Assessment w Gram Stain, Rflx to Resp Cult     Status: None   Collection Time: 07/14/22   2:10 PM   Specimen: Urine, Random; Sputum  Result Value Ref Range Status   Specimen Description URINE, RANDOM  Final   Special Requests NONE  Final   Sputum evaluation   Final    Sputum specimen not acceptable for testing.  Please recollect.   NOTIFIED ALLIE HEISEY FOR RECOLLECT ON  07/14/22 AT 1522 QSD Performed at [facility], [address] [place], [place] [address]    Report Status 07/14/2022 FINAL  Final  Expectorated Sputum Assessment w Gram Stain, Rflx to Resp Cult     Status: None   Collection Time: 07/14/22  6:19 PM   Specimen: Sputum  Result Value Ref Range Status   Specimen Description SPUTUM  Final   Special Requests Normal  Final   Sputum evaluation   Final    THIS SPECIMEN IS ACCEPTABLE FOR SPUTUM CULTURE Performed at [facility], [address], [place], [place] [address]    Report Status 07/14/2022 FINAL  Final  Culture, Respiratory w Gram Stain     Status: None (Preliminary result)   Collection Time: 07/14/22  6:19 PM   Specimen: SPU  Result Value Ref Range Status   Specimen Description   Final    SPUTUM Performed at [facility], [address], [place], [place] [address]    Special Requests   Final    Normal Reflexed from [phone] Performed at [facility], [address] [place], [place] [address]    Gram Stain   Final    NO SQUAMOUS EPITHELIAL CELLS SEEN WBC SEEN FEW GRAM POSITIVE RODS FEW GRAM NEGATIVE RODS FEW GRAM POSITIVE COCCI    Culture   Final    CULTURE REINCUBATED FOR BETTER GROWTH Performed at [facility], [address] [place], [place] [address]    Report Status PENDING  Incomplete     Time coordinating discharge: Over 30 minutes  SIGNED:   Tresa Moore, MD  [facility] 07/16/2022, 11:37 AM Pager   If 7PM-7AM, please contact night-coverage

## 2022-07-16 NOTE — TOC Initial Note (Signed)
Transition of Care [facility]) - Initial/Assessment Note    Patient Details  Name: Kelly Trujillo MRN: [id number] Date of Birth: June 24, 1961  Transition of [facility] CM/SW Contact:    Marlowe Sax, RN Phone Number: 07/16/2022, 10:02 AM  Clinical Narrative:                   Transition of Care [facility]) Screening Note   Patient Details  Name: Kelly Trujillo Date of Birth: November 06, 1961   Transition of Care [facility]) CM/SW Contact:    Marlowe Sax, RN Phone Number: 07/16/2022, 10:02 AM    Transition of Care Department [facility]) has reviewed patient and no TOC needs have been identified at this time. We will continue to monitor patient advancement through interdisciplinary progression rounds. If new patient transition needs arise, please place a TOC consult.         Patient Goals and CMS Choice        Expected Discharge Plan and Services           Expected Discharge Date: 07/16/22                                    Prior Living Arrangements/Services                       Activities of Daily Living Home Assistive Devices/Equipment: Dan Humphreys (specify type), Bedside commode/3-in-1 ADL Screening (condition at time of admission) Patient's cognitive ability adequate to safely complete daily activities?: Yes Is the patient deaf or have difficulty hearing?: No Does the patient have difficulty seeing, even when wearing glasses/contacts?: No Does the patient have difficulty concentrating, remembering, or making decisions?: No Patient able to express need for assistance with ADLs?: Yes Does the patient have difficulty dressing or bathing?: No Independently performs ADLs?: No Communication: Independent Dressing (OT): Independent with device (comment) Grooming: Independent with device (comment) Feeding: Independent with device (comment) Bathing: Independent with device (comment) Toileting: Needs assistance Is this a change from baseline?: Pre-admission  baseline In/Out Bed: Independent with device (comment) Walks in Home: Independent with device (comment), Needs assistance Is this a change from baseline?: Pre-admission baseline Does the patient have difficulty walking or climbing stairs?: Yes Weakness of Legs: Both Weakness of Arms/Hands: None  Permission Sought/Granted                  Emotional Assessment              Admission diagnosis:  Positive D dimer [R79.89] CAP (community acquired pneumonia) [J18.9] Acute cystitis with hematuria [N30.01] Sepsis (HCC) [A41.9] Acute on chronic respiratory failure with hypoxia (HCC) [J96.21] Closed fracture of multiple ribs of right side, initial encounter [S22.41XA] Sepsis, due to unspecified organism, unspecified whether acute organ dysfunction present [facility]) [A41.9] Patient Active Problem List   Diagnosis Date Noted   Obesity, Class III, BMI 40-49.9 (morbid obesity) (HCC) 07/15/2022   Sepsis (HCC) 07/14/2022   Acute pyelonephritis 07/14/2022   CAP (community acquired pneumonia) 07/14/2022   Kidney lesion 07/14/2022   COPD (chronic obstructive pulmonary disease) (HCC)    HTN (hypertension)    RA (rheumatoid arthritis) (HCC)    Chronic diastolic CHF (congestive heart failure) (HCC)    Lesion of spleen    Myocardial injury    Right rib fracture    Liver lesion    Positive D dimer    PCP:  [place], Moshe Salisbury, MD Pharmacy:  [id number] Dan Humphreys, [place] - [address] [place] [address] Phone: [phone] Fax: [phone]     Social Determinants of Health (SDOH) Interventions    Readmission Risk Interventions     No data to display

## 2022-07-16 NOTE — Plan of Care (Signed)
  Problem: Health Behavior/Discharge Planning: Goal: Ability to manage health-related needs will improve Outcome: Progressing   Problem: Education: Goal: Knowledge of General Education information will improve Description: Including pain rating scale, medication(s)/side effects and non-pharmacologic comfort measures Outcome: Progressing   Problem: Clinical Measurements: Goal: Ability to maintain clinical measurements within normal limits will improve Outcome: Progressing Goal: Will remain free from infection Outcome: Progressing Goal: Diagnostic test results will improve Outcome: Progressing Goal: Respiratory complications will improve Outcome: Progressing Goal: Cardiovascular complication will be avoided Outcome: Progressing   Problem: Nutrition: Goal: Adequate nutrition will be maintained Outcome: Progressing   Problem: Coping: Goal: Level of anxiety will decrease Outcome: Progressing   Problem: Elimination: Goal: Will not experience complications related to bowel motility Outcome: Progressing Goal: Will not experience complications related to urinary retention Outcome: Progressing

## 2022-07-17 ENCOUNTER — Ambulatory Visit: Admit: 2022-07-17 | Discharge: 2022-07-17 | Payer: PRIVATE HEALTH INSURANCE

## 2022-07-17 DIAGNOSIS — E119 Type 2 diabetes mellitus without complications: Principal | ICD-10-CM

## 2022-07-17 DIAGNOSIS — J449 Chronic obstructive pulmonary disease, unspecified: Principal | ICD-10-CM

## 2022-07-17 DIAGNOSIS — A4151 Sepsis due to Escherichia coli [E. coli]: Principal | ICD-10-CM

## 2022-07-17 DIAGNOSIS — I5032 Chronic diastolic (congestive) heart failure: Principal | ICD-10-CM

## 2022-07-17 DIAGNOSIS — M0579 Rheumatoid arthritis with rheumatoid factor of multiple sites without organ or systems involvement: Principal | ICD-10-CM

## 2022-07-17 DIAGNOSIS — M5416 Radiculopathy, lumbar region: Principal | ICD-10-CM

## 2022-07-17 DIAGNOSIS — I10 Essential (primary) hypertension: Principal | ICD-10-CM

## 2022-07-17 LAB — CULTURE, BLOOD (ROUTINE X 2)

## 2022-07-17 LAB — CULTURE, RESPIRATORY W GRAM STAIN
Culture: NORMAL
Gram Stain: NONE SEEN
Special Requests: NORMAL

## 2022-07-17 MED ORDER — GABAPENTIN 100 MG CAPSULE
ORAL_CAPSULE | Freq: Every evening | ORAL | 3 refills | 30 days | Status: CP | PRN
Start: 2022-07-17 — End: 2023-07-17

## 2022-07-18 ENCOUNTER — Emergency Department: Admit: 2022-07-18 | Payer: PRIVATE HEALTH INSURANCE

## 2022-07-18 ENCOUNTER — Ambulatory Visit: Admit: 2022-07-18 | Discharge: 2022-07-19 | Payer: PRIVATE HEALTH INSURANCE

## 2022-07-18 ENCOUNTER — Ambulatory Visit: Admit: 2022-07-18 | Payer: PRIVATE HEALTH INSURANCE

## 2022-07-18 ENCOUNTER — Ambulatory Visit
Admit: 2022-07-18 | Discharge: 2022-07-30 | Disposition: A | Discharge status: Skilled Nursing Facility | Payer: PRIVATE HEALTH INSURANCE | Source: Ambulatory Visit | Admitting: Foot and Ankle Surgery

## 2022-07-18 ENCOUNTER — Ambulatory Visit
Admit: 2022-07-18 | Discharge: 2022-07-19 | Payer: PRIVATE HEALTH INSURANCE | Attending: Physical Medicine & Rehabilitation | Primary: Physical Medicine & Rehabilitation

## 2022-07-18 DIAGNOSIS — M5416 Radiculopathy, lumbar region: Principal | ICD-10-CM

## 2022-07-18 DIAGNOSIS — M25551 Pain in right hip: Principal | ICD-10-CM

## 2022-07-18 MED ORDER — MAGNESIUM OXIDE 400 MG (241.3 MG MAGNESIUM) TABLET
ORAL_TABLET | Freq: Every day | ORAL | 11 refills | 30.00000 days | Status: CP
Start: 2022-07-18 — End: 2022-07-18

## 2022-07-18 MED ORDER — POTASSIUM CHLORIDE ER 20 MEQ TABLET,EXTENDED RELEASE(PART/CRYST)
ORAL_TABLET | Freq: Every day | ORAL | 3 refills | 90.00000 days | Status: CP
Start: 2022-07-18 — End: 2023-07-18

## 2022-07-19 ENCOUNTER — Encounter: Payer: Self-pay | Admitting: *Deleted

## 2022-07-19 DIAGNOSIS — J449 Chronic obstructive pulmonary disease, unspecified: Secondary | ICD-10-CM

## 2022-07-19 LAB — CULTURE, BLOOD (ROUTINE X 2): Culture: NO GROWTH

## 2022-07-19 NOTE — Progress Notes (Signed)
Pulmonary Individual Treatment Plan  Patient Details  Name: Kelly Trujillo MRN: [id number] Date of Birth: 01/21/1961 Referring Provider:   Flowsheet Row Pulmonary Rehab from 06/11/2022 in [facility] Cardiac and Pulmonary Rehab  Referring Provider Tai Wang       Initial Encounter Date:  Flowsheet Row Pulmonary Rehab from 06/11/2022 in [facility] Cardiac and Pulmonary Rehab  Date 06/11/22       Visit Diagnosis: Chronic obstructive pulmonary disease, unspecified COPD type ([place])  Patient's Home Medications on Admission:  Current Outpatient Medications:    acetaminophen (TYLENOL) 325 MG tablet, Take 650 mg by mouth every 6 (six) hours as needed for mild pain or moderate pain., Disp: , Rfl:    albuterol (VENTOLIN HFA) 108 (90 Base) MCG/ACT inhaler, Inhale 1-2 puffs into the lungs every 6 (six) hours as needed for wheezing or shortness of breath., Disp: , Rfl:    ascorbic acid (VITAMIN C) 500 MG tablet, Take 500 mg by mouth daily., Disp: , Rfl:    cholecalciferol (VITAMIN D3) 25 MCG (1000 UNIT) tablet, Take 1,000 Units by mouth daily., Disp: , Rfl:    ciprofloxacin (CIPRO) 500 MG tablet, Take 1 tablet (500 mg total) by mouth 2 (two) times daily for 11 days., Disp: 22 tablet, Rfl: 0   cyclobenzaprine (FLEXERIL) 5 MG tablet, Take 5 mg by mouth 3 (three) times daily as needed for muscle spasms., Disp: , Rfl:    fluticasone-salmeterol (ADVAIR HFA) 230-21 MCG/ACT inhaler, Inhale 2 puffs into the lungs daily., Disp: , Rfl:    folic acid (FOLVITE) 1 MG tablet, Take 1 mg by mouth daily., Disp: , Rfl:    furosemide (LASIX) 40 MG tablet, Take 40 mg by mouth daily as needed for fluid or edema., Disp: , Rfl:    hydroxychloroquine (PLAQUENIL) 200 MG tablet, Take 200 mg by mouth 2 (two) times daily., Disp: , Rfl:    magnesium oxide (MAG-OX) 400 MG tablet, Take 400 mg by mouth daily., Disp: , Rfl:    meloxicam (MOBIC) 15 MG tablet, Take 15 mg by mouth daily as needed for pain., Disp: , Rfl:    methotrexate  (RHEUMATREX) 2.5 MG tablet, Take 20 mg by mouth every Monday., Disp: , Rfl:    Multiple Vitamins-Minerals (MULTIVITAMIN WITH MINERALS) tablet, Take 1 tablet by mouth daily., Disp: , Rfl:    omeprazole (PRILOSEC) 20 MG capsule, Take 20 mg by mouth See admin instructions. Take 1 capsule (32m) by mouth every day Tuesday through Sunday - do not take on Monday, Disp: , Rfl:    oxyCODONE (OXY IR/ROXICODONE) 5 MG immediate release tablet, Take 5 mg by mouth every 12 (twelve) hours as needed for severe pain., Disp: , Rfl:    potassium chloride SA (KLOR-CON M) 20 MEQ tablet, Take 20 mEq by mouth daily., Disp: , Rfl:    predniSONE (DELTASONE) 5 MG tablet, Take 5 mg by mouth daily., Disp: , Rfl:    sulfaSALAzine (AZULFIDINE) 500 MG EC tablet, Take 500 mg by mouth 2 (two) times daily., Disp: , Rfl:    Tiotropium Bromide Monohydrate 2.5 MCG/ACT AERS, Inhale 2 each into the lungs daily., Disp: , Rfl:   Past Medical History: Past Medical History:  Diagnosis Date   COPD (chronic obstructive pulmonary disease) ([employer] [place]    Hypertension     Tobacco Use: Social History   Tobacco Use  Smoking Status Former   Packs/day: 1.00   Years: 35.00   Total pack years: 35.00   Types: Cigarettes   Quit date: 01/19/2022  Years since quitting: 0.4  Smokeless Tobacco Never    Labs: Review Flowsheet       Latest Ref Rng & Units 07/14/2022  Labs for ITP Cardiac and Pulmonary Rehab  Hemoglobin A1c 4.8 - 5.6 % 6.3      Pulmonary Assessment Scores:  Pulmonary Assessment Scores     Row Name 06/11/22 1547         ADL UCSD   SOB Score total 22     Rest 0     Walk 0     Stairs 1     Bath 2     Dress 2     Shop 2       CAT Score   CAT Score 9       mMRC Score   mMRC Score 0              UCSD: Self-administered rating of dyspnea associated with activities of daily living (ADLs) 6-point scale (0 = "not at all" to 5 = "maximal or unable to do because of breathlessness")  Scoring Scores range from 0  to 120.  Minimally important difference is 5 units  CAT: CAT can identify the health impairment of COPD patients and is better correlated with disease progression.  CAT has a scoring range of zero to 40. The CAT score is classified into four groups of low (less than 10), medium (10 - 20), high (21-30) and very high (31-40) based on the impact level of disease on health status. A CAT score over 10 suggests significant symptoms.  A worsening CAT score could be explained by an exacerbation, poor medication adherence, poor inhaler technique, or progression of COPD or comorbid conditions.  CAT MCID is 2 points  mMRC: mMRC (Modified Medical Research Council) Dyspnea Scale is used to assess the degree of baseline functional disability in patients of respiratory disease due to dyspnea. No minimal important difference is established. A decrease in score of 1 point or greater is considered a positive change.   Pulmonary Function Assessment:  Pulmonary Function Assessment - 05/24/22 0913       Breath   Shortness of Breath Yes;Limiting activity             Exercise Target Goals: Exercise Program Goal: Individual exercise prescription set using results from initial 6 min walk test and THRR while considering  patient's activity barriers and safety.   Exercise Prescription Goal: Initial exercise prescription builds to 30-45 minutes a day of aerobic activity, 2-3 days per week.  Home exercise guidelines will be given to patient during program as part of exercise prescription that the participant will acknowledge.  Education: Aerobic Exercise: - Group verbal and visual presentation on the components of exercise prescription. Introduces F.I.T.T principle from ACSM for exercise prescriptions.  Reviews F.I.T.T. principles of aerobic exercise including progression. Written material given at graduation. Flowsheet Row Pulmonary Rehab from 06/20/2022 in [facility] Cardiac and Pulmonary Rehab  Date 06/20/22   Educator [place]  Instruction Review Code 1- Verbalizes Understanding       Education: Resistance Exercise: - Group verbal and visual presentation on the components of exercise prescription. Introduces F.I.T.T principle from ACSM for exercise prescriptions  Reviews F.I.T.T. principles of resistance exercise including progression. Written material given at graduation.    Education: Exercise & Equipment Safety: - Individual verbal instruction and demonstration of equipment use and safety with use of the equipment. Flowsheet Row Pulmonary Rehab from 06/20/2022 in [facility] Cardiac and Pulmonary Rehab  Date 06/11/22  Educator [employer]  Instruction Review Code 1- Verbalizes Understanding       Education: Exercise Physiology & General Exercise Guidelines: - Group verbal and written instruction with models to review the exercise physiology of the cardiovascular system and associated critical values. Provides general exercise guidelines with specific guidelines to those with heart or lung disease.    Education: Flexibility, Balance, Mind/Body Relaxation: - Group verbal and visual presentation with interactive activity on the components of exercise prescription. Introduces F.I.T.T principle from ACSM for exercise prescriptions. Reviews F.I.T.T. principles of flexibility and balance exercise training including progression. Also discusses the mind body connection.  Reviews various relaxation techniques to help reduce and manage stress (i.e. Deep breathing, progressive muscle relaxation, and visualization). Balance handout provided to take home. Written material given at graduation.   Activity Barriers & Risk Stratification:   6 Minute Walk:  6 Minute Walk     Row Name 06/11/22 1530         6 Minute Walk   Phase Initial     Distance 665 feet     Walk Time 6 minutes     # of Rest Breaks 0     MPH 1.23     METS 1.81     RPE 11     Perceived Dyspnea  1     VO2 Peak 6.33     Symptoms No     Resting  HR 83 bpm     Resting BP 124/80     Resting Oxygen Saturation  93 %     Exercise Oxygen Saturation  during 6 min walk 86 %     Max Ex. HR 119 bpm     Max Ex. BP 138/88     2 Minute Post BP 124/80       Interval HR   1 Minute HR 90     2 Minute HR 111     3 Minute HR 114     4 Minute HR 111     5 Minute HR 119     6 Minute HR 118     Interval Heart Rate? Yes       Interval Oxygen   Interval Oxygen? Yes     Baseline Oxygen Saturation % 93 %     1 Minute Oxygen Saturation % 90 %     1 Minute Liters of Oxygen 2 L     2 Minute Oxygen Saturation % 87 %     2 Minute Liters of Oxygen 2 L     3 Minute Oxygen Saturation % 87 %     3 Minute Liters of Oxygen 2 L     4 Minute Oxygen Saturation % 89 %     4 Minute Liters of Oxygen 2 L     5 Minute Oxygen Saturation % 86 %     5 Minute Liters of Oxygen 2 L     6 Minute Oxygen Saturation % 86 %     6 Minute Liters of Oxygen 2 L     2 Minute Post Oxygen Saturation % 94 %     2 Minute Post Liters of Oxygen 2 L             Oxygen Initial Assessment:  Oxygen Initial Assessment - 06/11/22 1546       Home Oxygen   Home Oxygen Device Home Concentrator;Portable Concentrator;E-Tanks    Sleep Oxygen Prescription BiPAP    Liters per minute 2    Home  Exercise Oxygen Prescription Continuous    Liters per minute 2    Home Resting Oxygen Prescription Continuous    Liters per minute 2    Compliance with Home Oxygen Use Yes      Initial 6 min Walk   Oxygen Used Continuous    Liters per minute 2      Program Oxygen Prescription   Program Oxygen Prescription Continuous    Liters per minute 2      Intervention   Short Term Goals To learn and exhibit compliance with exercise, home and travel O2 prescription;To learn and understand importance of monitoring SPO2 with pulse oximeter and demonstrate accurate use of the pulse oximeter.;To learn and understand importance of maintaining oxygen saturations>88%;To learn and demonstrate proper pursed  lip breathing techniques or other breathing techniques. ;To learn and demonstrate proper use of respiratory medications    Long  Term Goals Exhibits compliance with exercise, home  and travel O2 prescription;Verbalizes importance of monitoring SPO2 with pulse oximeter and return demonstration;Maintenance of O2 saturations>88%;Exhibits proper breathing techniques, such as pursed lip breathing or other method taught during program session;Compliance with respiratory medication;Demonstrates proper use of MDI's             Oxygen Re-Evaluation:  Oxygen Re-Evaluation     Row Name 07/05/22 1413             Program Oxygen Prescription   Program Oxygen Prescription Continuous       Liters per minute 2         Home Oxygen   Home Oxygen Device Home Concentrator;Portable Concentrator;E-Tanks       Sleep Oxygen Prescription BiPAP       Liters per minute 2       Home Exercise Oxygen Prescription Continuous       Liters per minute 2       Home Resting Oxygen Prescription Continuous       Liters per minute 2         Goals/Expected Outcomes   Short Term Goals To learn and exhibit compliance with exercise, home and travel O2 prescription;To learn and understand importance of monitoring SPO2 with pulse oximeter and demonstrate accurate use of the pulse oximeter.;To learn and understand importance of maintaining oxygen saturations>88%;To learn and demonstrate proper pursed lip breathing techniques or other breathing techniques. ;To learn and demonstrate proper use of respiratory medications       Long  Term Goals Exhibits compliance with exercise, home  and travel O2 prescription;Verbalizes importance of monitoring SPO2 with pulse oximeter and return demonstration;Maintenance of O2 saturations>88%;Exhibits proper breathing techniques, such as pursed lip breathing or other method taught during program session;Compliance with respiratory medication;Demonstrates proper use of MDI's       Comments Patient  has expressed frustration with having to be on continuous oxygen and would like to progress her breathing to wear she no longer needs it. She states that she has come down from 4 L to 2 L of oxygen. She has been practicing PLB techniques and feels that her SOB during exercise has improved since starting the program.       Goals/Expected Outcomes Short: Continue to practice PLB. Long: Continue to attend rehab to improve SOB.                Oxygen Discharge (Final Oxygen Re-Evaluation):  Oxygen Re-Evaluation - 07/05/22 1413       Program Oxygen Prescription   Program Oxygen Prescription Continuous    Liters per minute  2      Home Oxygen   Home Oxygen Device Home Concentrator;Portable Concentrator;E-Tanks    Sleep Oxygen Prescription BiPAP    Liters per minute 2    Home Exercise Oxygen Prescription Continuous    Liters per minute 2    Home Resting Oxygen Prescription Continuous    Liters per minute 2      Goals/Expected Outcomes   Short Term Goals To learn and exhibit compliance with exercise, home and travel O2 prescription;To learn and understand importance of monitoring SPO2 with pulse oximeter and demonstrate accurate use of the pulse oximeter.;To learn and understand importance of maintaining oxygen saturations>88%;To learn and demonstrate proper pursed lip breathing techniques or other breathing techniques. ;To learn and demonstrate proper use of respiratory medications    Long  Term Goals Exhibits compliance with exercise, home  and travel O2 prescription;Verbalizes importance of monitoring SPO2 with pulse oximeter and return demonstration;Maintenance of O2 saturations>88%;Exhibits proper breathing techniques, such as pursed lip breathing or other method taught during program session;Compliance with respiratory medication;Demonstrates proper use of MDI's    Comments Patient has expressed frustration with having to be on continuous oxygen and would like to progress her breathing to  wear she no longer needs it. She states that she has come down from 4 L to 2 L of oxygen. She has been practicing PLB techniques and feels that her SOB during exercise has improved since starting the program.    Goals/Expected Outcomes Short: Continue to practice PLB. Long: Continue to attend rehab to improve SOB.             Initial Exercise Prescription:  Initial Exercise Prescription - 06/11/22 1500       Date of Initial Exercise RX and Referring Provider   Date 06/11/22    Referring Provider Tai Mina Marble      Oxygen   Oxygen Continuous    Liters 2    Maintain Oxygen Saturation 88% or higher      Recumbant Bike   Level 1    RPM 50    Minutes 15    METs 1.81      NuStep   Level 1    SPM 80    Minutes 15    METs 1.81      Arm Ergometer   Level 1    RPM 30    Minutes 15    METs 1.81      Biostep-RELP   Level 1    SPM 50    Minutes 15    METs 1.81      Track   Laps 15    Minutes 15    METs 1.82      Prescription Details   Frequency (times per week) 3    Duration Progress to 30 minutes of continuous aerobic without signs/symptoms of physical distress      Intensity   THRR 40-80% of Max Heartrate 113-143    Ratings of Perceived Exertion 11-13    Perceived Dyspnea 0-4      Progression   Progression Continue to progress workloads to maintain intensity without signs/symptoms of physical distress.      Resistance Training   Training Prescription Yes    Weight 2    Reps 10-15             Perform Capillary Blood Glucose checks as needed.  Exercise Prescription Changes:   Exercise Prescription Changes     Row Name 06/11/22 1500  Response to Exercise   Blood Pressure (Admit) 124/80       Blood Pressure (Exercise) 138/88       Blood Pressure (Exit) 124/80       Heart Rate (Admit) 83 bpm       Heart Rate (Exercise) 119 bpm       Heart Rate (Exit) 90 bpm       Oxygen Saturation (Admit) 93 %       Oxygen Saturation (Exercise) 86 %        Oxygen Saturation (Exit) 94 %       Rating of Perceived Exertion (Exercise) 11       Perceived Dyspnea (Exercise) 1       Comments 6 MWT results                Exercise Comments:   Exercise Goals and Review:   Exercise Goals     Row Name 06/11/22 1540             Exercise Goals   Increase Physical Activity Yes       Intervention Provide advice, education, support and counseling about physical activity/exercise needs.;Develop an individualized exercise prescription for aerobic and resistive training based on initial evaluation findings, risk stratification, comorbidities and participant's personal goals.       Expected Outcomes Short Term: Attend rehab on a regular basis to increase amount of physical activity.;Long Term: Add in home exercise to make exercise part of routine and to increase amount of physical activity.;Long Term: Exercising regularly at least 3-5 days a week.       Increase Strength and Stamina Yes       Intervention Provide advice, education, support and counseling about physical activity/exercise needs.;Develop an individualized exercise prescription for aerobic and resistive training based on initial evaluation findings, risk stratification, comorbidities and participant's personal goals.       Expected Outcomes Short Term: Increase workloads from initial exercise prescription for resistance, speed, and METs.;Short Term: Perform resistance training exercises routinely during rehab and add in resistance training at home;Long Term: Improve cardiorespiratory fitness, muscular endurance and strength as measured by increased METs and functional capacity (6MWT)       Able to understand and use rate of perceived exertion (RPE) scale Yes       Intervention Provide education and explanation on how to use RPE scale       Expected Outcomes Short Term: Able to use RPE daily in rehab to express subjective intensity level;Long Term:  Able to use RPE to guide intensity  level when exercising independently       Able to understand and use Dyspnea scale Yes       Intervention Provide education and explanation on how to use Dyspnea scale       Expected Outcomes Short Term: Able to use Dyspnea scale daily in rehab to express subjective sense of shortness of breath during exertion;Long Term: Able to use Dyspnea scale to guide intensity level when exercising independently       Knowledge and understanding of Target Heart Rate Range (THRR) Yes       Intervention Provide education and explanation of THRR including how the numbers were predicted and where they are located for reference       Expected Outcomes Short Term: Able to state/look up THRR;Short Term: Able to use daily as guideline for intensity in rehab;Long Term: Able to use THRR to govern intensity when exercising independently  Able to check pulse independently Yes       Intervention Provide education and demonstration on how to check pulse in carotid and radial arteries.;Review the importance of being able to check your own pulse for safety during independent exercise       Expected Outcomes Short Term: Able to explain why pulse checking is important during independent exercise;Long Term: Able to check pulse independently and accurately       Understanding of Exercise Prescription Yes       Intervention Provide education, explanation, and written materials on patient's individual exercise prescription       Expected Outcomes Short Term: Able to explain program exercise prescription;Long Term: Able to explain home exercise prescription to exercise independently                Exercise Goals Re-Evaluation :  Exercise Goals Re-Evaluation     Row Name 07/05/22 1358             Exercise Goal Re-Evaluation   Exercise Goals Review Increase Physical Activity;Understanding of Exercise Prescription;Increase Strength and Stamina       Comments Reviewed home exercise with pt today.  Pt plans to do  bodyweight exercises and walk for exercise.  Reviewed THR, pulse, RPE, sign and symptoms, pulse oximetery and when to call 911 or MD.  Also discussed weather considerations and indoor options.  Pt voiced understanding.       Expected Outcomes Short: Begin walking on days outside of rehab. Long: Continue to attand rehab for exercise benefits.                Discharge Exercise Prescription (Final Exercise Prescription Changes):  Exercise Prescription Changes - 06/11/22 1500       Response to Exercise   Blood Pressure (Admit) 124/80    Blood Pressure (Exercise) 138/88    Blood Pressure (Exit) 124/80    Heart Rate (Admit) 83 bpm    Heart Rate (Exercise) 119 bpm    Heart Rate (Exit) 90 bpm    Oxygen Saturation (Admit) 93 %    Oxygen Saturation (Exercise) 86 %    Oxygen Saturation (Exit) 94 %    Rating of Perceived Exertion (Exercise) 11    Perceived Dyspnea (Exercise) 1    Comments 6 MWT results             Nutrition:  Target Goals: Understanding of nutrition guidelines, daily intake of sodium <1519m, cholesterol <2063m calories 30% from fat and 7% or less from saturated fats, daily to have 5 or more servings of fruits and vegetables.  Education: All About Nutrition: -Group instruction provided by verbal, written material, interactive activities, discussions, models, and posters to present general guidelines for heart healthy nutrition including fat, fiber, MyPlate, the role of sodium in heart healthy nutrition, utilization of the nutrition label, and utilization of this knowledge for meal planning. Follow up email sent as well. Written material given at graduation.   Biometrics:  Pre Biometrics - 06/11/22 1541       Pre Biometrics   Height 5' 6.5" (1.689 m)    Weight 245 lb 6.4 oz (111.3 kg)    BMI (Calculated) 39.02    Single Leg Stand 2.2 seconds              Nutrition Therapy Plan and Nutrition Goals:  Nutrition Therapy & Goals - 06/25/22 1513        Nutrition Therapy   Diet Heart healthy, low Na, T2DM MNT  Protein (specify units) 95g    Fiber 25 grams    Whole Grain Foods 3 servings    Saturated Fats 12 max. grams    Fruits and Vegetables 8 servings/day    Sodium 2 grams      Personal Nutrition Goals   Nutrition Goal ST: Pairing meals with nutritionally dense food. Utilize frozen vegetables, batch cooking, and ingredient prep LT: follow Myplate guidelines, limit saturated fat <12g/day, limit added sugar <24g/day    Comments 61 y.o. F admitted to pulmonary rehab for COPD. PMHx HFpEF, RA, T2DM, HTN. Relevant medications includes vit D3, MVI, turmeric, green tea extract, folvite, mag-ox, omeprazole, lasix, cyclobenzaprine, hydrochlorothiazide, potassium, prednisone. PYP Score: 56. Vegetables & Fruits 5/12. Breads, Grains & Cereals 6/12. Red & Processed Meat 8/12. Poultry 2/2. Fish & Shellfish 1/4. Beans, Nuts & Seeds 1/4. Milk & Dairy Foods 2/6. Toppings, Oils, Seasonings & Salt 14/20. Sweets, Snacks & Restaurant Food 7/14. Beverages 10/10.  B: fruit with cottage cheese L: sandwich S: doughnuts, carrot cake, baked goods D: steak with potato and greens, pizza or cheese burgers, or takeout S: ice cream or strawberries with cake Drinks: water, dr pepper on occasion. She reports that she is on prednisone so her hunger is elevated - especially during snacking. She reports that she has been craving sweets more so than usual - she feels that it has to do with her mobility as well as quitting smoking (February 2023). Discussed heart healthy eating and T2DM MNT. She reports that her pain is a barrier for her - discussed when she does cook to make extra to freeze, utilize convenience foods such as frozen vegetables and microwave grains, and ingredient prepping on days when her pain is better. Suggested adding before taking away - having a smaller portion of baked goods and pairing them with cottage cheese or peanut butter for example.      Intervention Plan    Intervention Prescribe, educate and counsel regarding individualized specific dietary modifications aiming towards targeted core components such as weight, hypertension, lipid management, diabetes, heart failure and other comorbidities.    Expected Outcomes Short Term Goal: Understand basic principles of dietary content, such as calories, fat, sodium, cholesterol and nutrients.;Short Term Goal: A plan has been developed with personal nutrition goals set during dietitian appointment.;Long Term Goal: Adherence to prescribed nutrition plan.             Nutrition Assessments:  MEDIFICTS Score Key: ?70 Need to make dietary changes  40-70 Heart Healthy Diet ? 40 Therapeutic Level Cholesterol Diet  Flowsheet Row Pulmonary Rehab from 06/11/2022 in [facility] Cardiac and Pulmonary Rehab  Picture Your Plate Total Score on Admission 56      Picture Your Plate Scores: <78 Unhealthy dietary pattern with much room for improvement. 41-50 Dietary pattern unlikely to meet recommendations for good health and room for improvement. 51-60 More healthful dietary pattern, with some room for improvement.  >60 Healthy dietary pattern, although there may be some specific behaviors that could be improved.   Nutrition Goals Re-Evaluation:   Nutrition Goals Discharge (Final Nutrition Goals Re-Evaluation):   Psychosocial: Target Goals: Acknowledge presence or absence of significant depression and/or stress, maximize coping skills, provide positive support system. Participant is able to verbalize types and ability to use techniques and skills needed for reducing stress and depression.   Education: Stress, Anxiety, and Depression - Group verbal and visual presentation to define topics covered.  Reviews how body is impacted by stress, anxiety, and depression.  Also discusses  healthy ways to reduce stress and to treat/manage anxiety and depression.  Written material given at graduation.   Education: Sleep  Hygiene -Provides group verbal and written instruction about how sleep can affect your health.  Define sleep hygiene, discuss sleep cycles and impact of sleep habits. Review good sleep hygiene tips.    Initial Review & Psychosocial Screening:  Initial Psych Review & Screening - 05/24/22 0921       Initial Review   Current issues with Current Stress Concerns    Source of Stress Concerns Chronic Illness;Unable to participate in former interests or hobbies;Occupation    Comments She has been out of work for 4 months and has been frustrating. She has been having trouble sleepingand wants to gether health back to normal. She was working at [employer] in [place]. Her health and being on oxygen is one of of her big stress concerns. She is in pain alot and has to use a walker.      Family Dynamics   Good Support System? Yes      Barriers   Psychosocial barriers to participate in program The patient should benefit from training in stress management and relaxation.      Screening Interventions   Interventions Encouraged to exercise;To provide support and resources with identified psychosocial needs;Provide feedback about the scores to participant    Expected Outcomes Short Term goal: Utilizing psychosocial counselor, staff and physician to assist with identification of specific Stressors or current issues interfering with healing process. Setting desired goal for each stressor or current issue identified.;Long Term Goal: Stressors or current issues are controlled or eliminated.;Short Term goal: Identification and review with participant of any Quality of Life or Depression concerns found by scoring the questionnaire.;Long Term goal: The participant improves quality of Life and PHQ9 Scores as seen by post scores and/or verbalization of changes             Quality of Life Scores:  Scores of 19 and below usually indicate a poorer quality of life in these areas.  A difference of  2-3 points is a  clinically meaningful difference.  A difference of 2-3 points in the total score of the Quality of Life Index has been associated with significant improvement in overall quality of life, self-image, physical symptoms, and general health in studies assessing change in quality of life.  PHQ-9: Review Flowsheet       07/02/2022 06/11/2022  Depression screen PHQ 2/9  Decreased Interest 1 0  Down, Depressed, Hopeless 1 1  PHQ - 2 Score 2 1  Altered sleeping 3 2  Tired, decreased energy 3 1  Change in appetite 3 1  Feeling bad or failure about yourself  0 0  Trouble concentrating 3 1  Moving slowly or fidgety/restless 3 1  Suicidal thoughts 0 0  PHQ-9 Score 17 7  Difficult doing work/chores Extremely dIfficult -   Interpretation of Total Score  Total Score Depression Severity:  1-4 = Minimal depression, 5-9 = Mild depression, 10-14 = Moderate depression, 15-19 = Moderately severe depression, 20-27 = Severe depression   Psychosocial Evaluation and Intervention:  Psychosocial Evaluation - 05/24/22 0924       Psychosocial Evaluation & Interventions   Interventions Encouraged to exercise with the program and follow exercise prescription;Stress management education;Relaxation education    Comments She has been out of work for 4 months and has been frustrating. She has been having trouble sleepingand wants to gether health back to normal. She was working at [employer]  in [place]. Her health and being on oxygen is one of of her big stress concerns. She is in pain alot and has to use a walker.    Expected Outcomes Short: Start [facility] to help with mood. Long: Maintain a healthy mental state.    Continue Psychosocial Services  Follow up required by staff             Psychosocial Re-Evaluation:  Psychosocial Re-Evaluation     [place] Name 07/05/22 1403             Psychosocial Re-Evaluation   Current issues with Current Depression;Current Stress Concerns;Current Sleep Concerns        Comments Patient reports that her pain has been the leading cause of her depression. She also states feeling some frustration regarding her oxygen. She has also been struggling with her sleep due to uncomfortability due to her knee pain. She does state that working in the yard and exercising have been good avenues for stress relief.       Expected Outcomes Short: Continue to attend rehab for stress relief. Long: Keep a positive outlook.       Interventions Encouraged to attend Cardiac Rehabilitation for the exercise       Continue Psychosocial Services  Follow up required by staff         Initial Review   Source of Stress Concerns Chronic Illness;Unable to participate in former interests or hobbies;Occupation                Psychosocial Discharge (Final Psychosocial Re-Evaluation):  Psychosocial Re-Evaluation - 07/05/22 1403       Psychosocial Re-Evaluation   Current issues with Current Depression;Current Stress Concerns;Current Sleep Concerns    Comments Patient reports that her pain has been the leading cause of her depression. She also states feeling some frustration regarding her oxygen. She has also been struggling with her sleep due to uncomfortability due to her knee pain. She does state that working in the yard and exercising have been good avenues for stress relief.    Expected Outcomes Short: Continue to attend rehab for stress relief. Long: Keep a positive outlook.    Interventions Encouraged to attend Cardiac Rehabilitation for the exercise    Continue Psychosocial Services  Follow up required by staff      Initial Review   Source of Stress Concerns Chronic Illness;Unable to participate in former interests or hobbies;Occupation             Education: Education Goals: Education classes will be provided on a weekly basis, covering required topics. Participant will state understanding/return demonstration of topics presented.  Learning Barriers/Preferences:  Learning  Barriers/Preferences - 05/24/22 0914       Learning Barriers/Preferences   Learning Barriers None    Learning Preferences None             General Pulmonary Education Topics:  Infection Prevention: - Provides verbal and written material to individual with discussion of infection control including proper hand washing and proper equipment cleaning during exercise session. Flowsheet Row Pulmonary Rehab from 06/20/2022 in [facility] Cardiac and Pulmonary Rehab  Date 06/11/22  Educator [facility]  Instruction Review Code 1- Verbalizes Understanding       Falls Prevention: - Provides verbal and written material to individual with discussion of falls prevention and safety. Flowsheet Row Pulmonary Rehab from 06/20/2022 in [facility] Cardiac and Pulmonary Rehab  Date 06/11/22  Educator [facility]  Instruction Review Code 1- [employer]  Chronic Lung Disease Review: - Group verbal instruction with posters, models, PowerPoint presentations and videos,  to review new updates, new respiratory medications, new advancements in procedures and treatments. Providing information on websites and "800" numbers for continued self-education. Includes information about supplement oxygen, available portable oxygen systems, continuous and intermittent flow rates, oxygen safety, concentrators, and Medicare reimbursement for oxygen. Explanation of Pulmonary Drugs, including class, frequency, complications, importance of spacers, rinsing mouth after steroid MDI's, and proper cleaning methods for nebulizers. Review of basic lung anatomy and physiology related to function, structure, and complications of lung disease. Review of risk factors. Discussion about methods for diagnosing sleep apnea and types of masks and machines for OSA. Includes a review of the use of types of environmental controls: home humidity, furnaces, filters, dust mite/pet prevention, HEPA vacuums. Discussion about weather changes, air quality and the  benefits of nasal washing. Instruction on Warning signs, infection symptoms, calling MD promptly, preventive modes, and value of vaccinations. Review of effective airway clearance, coughing and/or vibration techniques. Emphasizing that all should Create an Action Plan. Written material given at graduation. Flowsheet Row Pulmonary Rehab from 06/20/2022 in [facility] Cardiac and Pulmonary Rehab  Education need identified 06/11/22       AED/CPR: - Group verbal and written instruction with the use of models to demonstrate the basic use of the AED with the basic ABC's of resuscitation.    Anatomy and Cardiac Procedures: - Group verbal and visual presentation and models provide information about basic cardiac anatomy and function. Reviews the testing methods done to diagnose heart disease and the outcomes of the test results. Describes the treatment choices: Medical Management, Angioplasty, or Coronary Bypass Surgery for treating various heart conditions including Myocardial Infarction, Angina, Valve Disease, and Cardiac Arrhythmias.  Written material given at graduation.   Medication Safety: - Group verbal and visual instruction to review commonly prescribed medications for heart and lung disease. Reviews the medication, class of the drug, and side effects. Includes the steps to properly store meds and maintain the prescription regimen.  Written material given at graduation.   Other: -Provides group and verbal instruction on various topics (see comments)   Knowledge Questionnaire Score:  Knowledge Questionnaire Score - 06/11/22 1545       Knowledge Questionnaire Score   Pre Score 15/18              Core Components/Risk Factors/Patient Goals at Admission:  Personal Goals and Risk Factors at Admission - 06/11/22 1542       Core Components/Risk Factors/Patient Goals on Admission    Weight Management Yes    Intervention Weight Management: Develop a combined nutrition and exercise program  designed to reach desired caloric intake, while maintaining appropriate intake of nutrient and fiber, sodium and fats, and appropriate energy expenditure required for the weight goal.;Weight Management: Provide education and appropriate resources to help participant work on and attain dietary goals.;Weight Management/Obesity: Establish reasonable short term and long term weight goals.;Obesity: Provide education and appropriate resources to help participant work on and attain dietary goals.    Admit Weight 245 lb 6.4 oz (111.3 kg)    Goal Weight: Short Term 240 lb (108.9 kg)    Goal Weight: Long Term 220 lb (99.8 kg)    Expected Outcomes Short Term: Continue to assess and modify interventions until short term weight is achieved;Long Term: Adherence to nutrition and physical activity/exercise program aimed toward attainment of established weight goal;Weight Loss: Understanding of general recommendations for a balanced deficit meal plan, which  promotes 1-2 lb weight loss per week and includes a negative energy balance of [id number] kcal/d;Understanding recommendations for meals to include 15-35% energy as protein, 25-35% energy from fat, 35-60% energy from carbohydrates, less than 251m of dietary cholesterol, 20-35 gm of total fiber daily;Understanding of distribution of calorie intake throughout the day with the consumption of 4-5 meals/snacks    Tobacco Cessation Yes    Number of packs per day 0    Intervention Assist the participant in steps to quit. Provide individualized education and counseling about committing to Tobacco Cessation, relapse prevention, and pharmacological support that can be provided by physician.;OAdvice worker assist with locating and accessing local/national Quit Smoking programs, and support quit date choice.    Expected Outcomes Short Term: Will demonstrate readiness to quit, by selecting a quit date.;Short Term: Will quit all tobacco product use, adhering to  prevention of relapse plan.;Long Term: Complete abstinence from all tobacco products for at least 12 months from quit date.    Improve shortness of breath with ADL's Yes    Intervention Provide education, individualized exercise plan and daily activity instruction to help decrease symptoms of SOB with activities of daily living.    Expected Outcomes Long Term: Be able to perform more ADLs without symptoms or delay the onset of symptoms;Short Term: Improve cardiorespiratory fitness to achieve a reduction of symptoms when performing ADLs    Hypertension Yes    Intervention Provide education on lifestyle modifcations including regular physical activity/exercise, weight management, moderate sodium restriction and increased consumption of fresh fruit, vegetables, and low fat dairy, alcohol moderation, and smoking cessation.;Monitor prescription use compliance.    Expected Outcomes Short Term: Continued assessment and intervention until BP is < 140/932mHG in hypertensive participants. < 130/8056mG in hypertensive participants with diabetes, heart failure or chronic kidney disease.;Long Term: Maintenance of blood pressure at goal levels.    Lipids Yes    Intervention Provide education and support for participant on nutrition & aerobic/resistive exercise along with prescribed medications to achieve LDL <3m6mDL >40mg38m Expected Outcomes Short Term: Participant states understanding of desired cholesterol values and is compliant with medications prescribed. Participant is following exercise prescription and nutrition guidelines.;Long Term: Cholesterol controlled with medications as prescribed, with individualized exercise RX and with personalized nutrition plan. Value goals: LDL < 3mg,84m > 40 mg.             Education:Diabetes - Individual verbal and written instruction to review signs/symptoms of diabetes, desired ranges of glucose level fasting, after meals and with exercise. Acknowledge that pre  and post exercise glucose checks will be done for 3 sessions at entry of program.   Know Your Numbers and Heart Failure: - Group verbal and visual instruction to discuss disease risk factors for cardiac and pulmonary disease and treatment options.  Reviews associated critical values for Overweight/Obesity, Hypertension, Cholesterol, and Diabetes.  Discusses basics of heart failure: signs/symptoms and treatments.  Introduces Heart Failure Zone chart for action plan for heart failure.  Written material given at graduation.   Core Components/Risk Factors/Patient Goals Review:   Goals and Risk Factor Review     Row Name 07/05/22 1410             Core Components/Risk Factors/Patient Goals Review   Personal Goals Review Improve shortness of breath with ADL's;Increase knowledge of respiratory medications and ability to use respiratory devices properly.;Develop more efficient breathing techniques such as purse lipped breathing and diaphragmatic breathing and practicing self-pacing with activity.;Weight  Management/Obesity       Review Patient reports that she would like to lose some weight with a weight goal of 190 lbs. She is working on PLB techniques and feels she is making progress with her breathing.       Expected Outcomes Short: Continue to practice PLB. Long: Attend rehab and practice healthy dietary habits to reach weight goal.                Core Components/Risk Factors/Patient Goals at Discharge (Final Review):   Goals and Risk Factor Review - 07/05/22 1410       Core Components/Risk Factors/Patient Goals Review   Personal Goals Review Improve shortness of breath with ADL's;Increase knowledge of respiratory medications and ability to use respiratory devices properly.;Develop more efficient breathing techniques such as purse lipped breathing and diaphragmatic breathing and practicing self-pacing with activity.;Weight Management/Obesity    Review Patient reports that she would like to  lose some weight with a weight goal of 190 lbs. She is working on PLB techniques and feels she is making progress with her breathing.    Expected Outcomes Short: Continue to practice PLB. Long: Attend rehab and practice healthy dietary habits to reach weight goal.             ITP Comments:  ITP Comments     Row Name 05/24/22 0937 06/11/22 1528 06/11/22 1758 06/25/22 1513 06/27/22 0810   ITP Comments Virtual Visit completed. Patient informed on EP and RD appointment and 6 Minute walk test. Patient also informed of patient health questionnaires on My Chart. Patient Verbalizes understanding. Visit diagnosis can be found in [facility]/11/[facility]. Completed 6MWT and gym orientation. Initial ITP created and sent for review to Dr. Zetta Bills, Medical Director. [place] has recently quit tobacco use within the last 6 months. Intervention for relapse prevention was provided at the initial medical review. He was encouraged to continue to with tobacco cessation and was provided information on relapse prevention. Patient received information about combination therapy, tobacco cessation classes, quit line, and quit smoking apps in case of a relapse. Patient demonstrated understanding of this material.Staff will continue to provide encouragement and follow up with the patient throughout the program. Completed initial RD consultation 30 Day review completed. Medical Director ITP review done, changes made as directed, and signed approval by Medical Director.    [place] Name 07/16/22 0913 07/19/22 1432         ITP Comments Patient called to let us know she is in the hospital for pneumonia- not sure on discharge date. May need clearance to return- will keep [place] updated when she can come back. Patient called and wishes to be discharged after stating she has a fractured hip. Instructions given to ask doctor for new referral if she wishes to come back.               Comments: Discharge ITP

## 2022-07-24 MED ORDER — IBUPROFEN 800 MG TABLET
ORAL_TABLET | Freq: Three times a day (TID) | ORAL | 0 refills | 10 days | Status: CP | PRN
Start: 2022-07-24 — End: 2022-08-03

## 2022-07-24 MED ORDER — ACETAMINOPHEN 500 MG TABLET
ORAL_TABLET | Freq: Three times a day (TID) | ORAL | 1 refills | 7 days | Status: CP
Start: 2022-07-24 — End: 2022-08-07

## 2022-07-24 MED ORDER — DOCUSATE SODIUM 100 MG CAPSULE
ORAL_CAPSULE | Freq: Two times a day (BID) | ORAL | 0 refills | 14 days | Status: CP
Start: 2022-07-24 — End: 2022-08-07

## 2022-07-24 MED ORDER — PROMETHAZINE 12.5 MG TABLET
ORAL_TABLET | Freq: Four times a day (QID) | ORAL | 0 refills | 5 days | Status: CP | PRN
Start: 2022-07-24 — End: 2022-07-31

## 2022-07-30 MED ORDER — TIZANIDINE 2 MG TABLET
ORAL_TABLET | Freq: Four times a day (QID) | ORAL | 0 refills | 8 days | Status: CP | PRN
Start: 2022-07-30 — End: ?

## 2022-08-06 ENCOUNTER — Ambulatory Visit
Admit: 2022-08-06 | Discharge: 2022-08-07 | Payer: PRIVATE HEALTH INSURANCE | Attending: Foot and Ankle Surgery | Primary: Foot and Ankle Surgery

## 2022-08-07 ENCOUNTER — Ambulatory Visit: Admit: 2022-08-07 | Discharge: 2022-08-08 | Payer: PRIVATE HEALTH INSURANCE

## 2022-08-13 MED ORDER — POTASSIUM CHLORIDE ER 20 MEQ TABLET,EXTENDED RELEASE(PART/CRYST)
ORAL_TABLET | Freq: Every day | ORAL | 3 refills | 90 days | Status: CP
Start: 2022-08-13 — End: 2023-08-13

## 2022-08-13 MED ORDER — MAGNESIUM OXIDE 400 MG (241.3 MG MAGNESIUM) TABLET
ORAL_TABLET | Freq: Every day | ORAL | 3 refills | 90 days | Status: CP
Start: 2022-08-13 — End: 2023-08-13

## 2022-08-22 ENCOUNTER — Ambulatory Visit: Admit: 2022-08-22 | Discharge: 2022-08-23 | Payer: PRIVATE HEALTH INSURANCE

## 2022-08-22 DIAGNOSIS — R21 Rash and other nonspecific skin eruption: Principal | ICD-10-CM

## 2022-08-22 DIAGNOSIS — R935 Abnormal findings on diagnostic imaging of other abdominal regions, including retroperitoneum: Principal | ICD-10-CM

## 2022-08-22 DIAGNOSIS — S72012D Unspecified intracapsular fracture of left femur, subsequent encounter for closed fracture with routine healing: Principal | ICD-10-CM

## 2022-08-22 MED ORDER — TRIAMCINOLONE ACETONIDE 0.1 % TOPICAL OINTMENT
Freq: Two times a day (BID) | TOPICAL | 0 refills | 0 days | Status: CP
Start: 2022-08-22 — End: 2023-08-22

## 2022-09-03 ENCOUNTER — Ambulatory Visit: Admit: 2022-09-03 | Discharge: 2022-09-04 | Payer: PRIVATE HEALTH INSURANCE

## 2022-09-03 ENCOUNTER — Ambulatory Visit
Admit: 2022-09-03 | Discharge: 2022-09-04 | Payer: PRIVATE HEALTH INSURANCE | Attending: Foot and Ankle Surgery | Primary: Foot and Ankle Surgery

## 2022-09-03 DIAGNOSIS — M0579 Rheumatoid arthritis with rheumatoid factor of multiple sites without organ or systems involvement: Principal | ICD-10-CM

## 2022-09-03 DIAGNOSIS — S72001D Fracture of unspecified part of neck of right femur, subsequent encounter for closed fracture with routine healing: Principal | ICD-10-CM

## 2022-09-10 ENCOUNTER — Ambulatory Visit: Admit: 2022-09-10 | Discharge: 2022-09-11 | Payer: PRIVATE HEALTH INSURANCE

## 2022-09-10 DIAGNOSIS — R935 Abnormal findings on diagnostic imaging of other abdominal regions, including retroperitoneum: Principal | ICD-10-CM

## 2022-09-10 DIAGNOSIS — S72012D Unspecified intracapsular fracture of left femur, subsequent encounter for closed fracture with routine healing: Principal | ICD-10-CM

## 2022-09-10 DIAGNOSIS — R21 Rash and other nonspecific skin eruption: Principal | ICD-10-CM

## 2022-09-24 ENCOUNTER — Ambulatory Visit: Admit: 2022-09-24 | Discharge: 2022-09-25 | Payer: PRIVATE HEALTH INSURANCE

## 2022-10-03 ENCOUNTER — Ambulatory Visit
Admit: 2022-10-03 | Discharge: 2022-10-04 | Payer: PRIVATE HEALTH INSURANCE | Attending: Internal Medicine | Primary: Internal Medicine

## 2022-10-03 DIAGNOSIS — M0579 Rheumatoid arthritis with rheumatoid factor of multiple sites without organ or systems involvement: Principal | ICD-10-CM

## 2022-10-11 MED ORDER — MELOXICAM 15 MG TABLET
ORAL_TABLET | Freq: Every day | ORAL | 3 refills | 15 days | Status: CP
Start: 2022-10-11 — End: 2023-10-11

## 2022-10-15 MED ORDER — HUMIRA PEN CITRATE FREE 40 MG/0.4 ML
SUBCUTANEOUS | 11 refills | 28 days | Status: CP
Start: 2022-10-15 — End: ?

## 2022-10-16 DIAGNOSIS — M0579 Rheumatoid arthritis with rheumatoid factor of multiple sites without organ or systems involvement: Principal | ICD-10-CM

## 2022-10-17 DIAGNOSIS — M0579 Rheumatoid arthritis with rheumatoid factor of multiple sites without organ or systems involvement: Principal | ICD-10-CM

## 2022-10-17 MED ORDER — HUMIRA PEN CITRATE FREE 40 MG/0.4 ML
SUBCUTANEOUS | 11 refills | 28 days | Status: CP
Start: 2022-10-17 — End: ?

## 2022-10-23 ENCOUNTER — Ambulatory Visit: Admit: 2022-10-23 | Discharge: 2022-10-23 | Payer: PRIVATE HEALTH INSURANCE

## 2022-10-23 DIAGNOSIS — S72011D Unspecified intracapsular fracture of right femur, subsequent encounter for closed fracture with routine healing: Principal | ICD-10-CM

## 2022-10-23 DIAGNOSIS — E119 Type 2 diabetes mellitus without complications: Principal | ICD-10-CM

## 2022-10-23 DIAGNOSIS — N2889 Other specified disorders of kidney and ureter: Principal | ICD-10-CM

## 2022-10-23 DIAGNOSIS — M0579 Rheumatoid arthritis with rheumatoid factor of multiple sites without organ or systems involvement: Principal | ICD-10-CM

## 2022-10-23 DIAGNOSIS — N289 Disorder of kidney and ureter, unspecified: Principal | ICD-10-CM

## 2022-10-23 DIAGNOSIS — J449 Chronic obstructive pulmonary disease, unspecified: Principal | ICD-10-CM

## 2022-10-23 DIAGNOSIS — M25552 Pain in left hip: Principal | ICD-10-CM

## 2022-10-23 MED ORDER — MELOXICAM 15 MG TABLET
ORAL_TABLET | Freq: Every day | ORAL | 3 refills | 30 days | Status: CP
Start: 2022-10-23 — End: 2023-10-23

## 2022-10-23 MED ORDER — ATORVASTATIN 10 MG TABLET
ORAL_TABLET | Freq: Every day | ORAL | 3 refills | 90 days | Status: CP
Start: 2022-10-23 — End: 2023-10-23

## 2022-10-24 ENCOUNTER — Inpatient Hospital Stay: Admit: 2022-10-24 | Payer: PRIVATE HEALTH INSURANCE

## 2022-10-29 ENCOUNTER — Ambulatory Visit
Admit: 2022-10-29 | Discharge: 2022-10-30 | Payer: PRIVATE HEALTH INSURANCE | Attending: Foot and Ankle Surgery | Primary: Foot and Ankle Surgery

## 2022-11-12 MED ORDER — FOLIC ACID 1 MG TABLET
ORAL_TABLET | Freq: Every day | ORAL | 3 refills | 90 days | Status: CP
Start: 2022-11-12 — End: ?

## 2022-11-14 ENCOUNTER — Ambulatory Visit: Admit: 2022-11-14 | Discharge: 2022-11-15 | Payer: PRIVATE HEALTH INSURANCE

## 2022-11-14 DIAGNOSIS — F17211 Nicotine dependence, cigarettes, in remission: Principal | ICD-10-CM

## 2022-11-14 DIAGNOSIS — J449 Chronic obstructive pulmonary disease, unspecified: Principal | ICD-10-CM

## 2022-11-14 MED ORDER — FLUTICASONE PROPIONATE 230 MCG-SALMETEROL 21 MCG/ACTUATION HFA INHALER
Freq: Two times a day (BID) | RESPIRATORY_TRACT | 4 refills | 90 days | Status: CP
Start: 2022-11-14 — End: 2023-02-12

## 2022-11-14 MED ORDER — TIOTROPIUM BROMIDE 2.5 MCG/ACTUATION MIST FOR INHALATION
Freq: Every day | RESPIRATORY_TRACT | 11 refills | 120 days | Status: CP
Start: 2022-11-14 — End: ?

## 2022-11-15 ENCOUNTER — Ambulatory Visit: Admit: 2022-11-15 | Discharge: 2022-11-16 | Payer: PRIVATE HEALTH INSURANCE

## 2022-11-15 DIAGNOSIS — M25569 Pain in unspecified knee: Principal | ICD-10-CM

## 2022-11-15 DIAGNOSIS — M1612 Unilateral primary osteoarthritis, left hip: Principal | ICD-10-CM

## 2022-11-17 DIAGNOSIS — I10 Essential (primary) hypertension: Principal | ICD-10-CM

## 2022-11-19 MED ORDER — HYDROCHLOROTHIAZIDE 25 MG TABLET
ORAL_TABLET | Freq: Every day | ORAL | 4 refills | 90 days | Status: CP
Start: 2022-11-19 — End: 2023-11-25

## 2022-11-22 ENCOUNTER — Ambulatory Visit: Admit: 2022-11-22 | Discharge: 2022-11-23

## 2022-11-22 DIAGNOSIS — M0579 Rheumatoid arthritis with rheumatoid factor of multiple sites without organ or systems involvement: Principal | ICD-10-CM

## 2022-11-22 DIAGNOSIS — I10 Essential (primary) hypertension: Principal | ICD-10-CM

## 2022-11-22 DIAGNOSIS — E119 Type 2 diabetes mellitus without complications: Principal | ICD-10-CM

## 2022-12-20 DIAGNOSIS — K047 Periapical abscess without sinus: Principal | ICD-10-CM

## 2022-12-20 MED ORDER — PENICILLIN V POTASSIUM 500 MG TABLET
ORAL_TABLET | Freq: Three times a day (TID) | ORAL | 0 refills | 14.00000 days | Status: CP
Start: 2022-12-20 — End: 2023-01-03

## 2022-12-24 DIAGNOSIS — K219 Gastro-esophageal reflux disease without esophagitis: Principal | ICD-10-CM

## 2022-12-24 MED ORDER — OMEPRAZOLE 20 MG CAPSULE,DELAYED RELEASE
ORAL_CAPSULE | 4 refills | 0 days | Status: CP
Start: 2022-12-24 — End: ?

## 2022-12-25 DIAGNOSIS — M0579 Rheumatoid arthritis with rheumatoid factor of multiple sites without organ or systems involvement: Principal | ICD-10-CM

## 2022-12-25 MED ORDER — ADALIMUMAB PEN CITRATE FREE 40 MG/0.4 ML
SUBCUTANEOUS | 3 refills | 84 days | Status: CP
Start: 2022-12-25 — End: ?

## 2022-12-28 DIAGNOSIS — M0579 Rheumatoid arthritis with rheumatoid factor of multiple sites without organ or systems involvement: Principal | ICD-10-CM

## 2022-12-28 MED ORDER — ADALIMUMAB PEN CITRATE FREE 40 MG/0.4 ML
SUBCUTANEOUS | 3 refills | 84 days | Status: CP
Start: 2022-12-28 — End: ?
  Filled 2023-01-09: qty 2, 28d supply, fill #0

## 2023-01-01 DIAGNOSIS — M0579 Rheumatoid arthritis with rheumatoid factor of multiple sites without organ or systems involvement: Principal | ICD-10-CM

## 2023-01-01 NOTE — Unmapped (Signed)
RHEUMATOLOGY OUTPATIENT FOLLOWUP VISIT    Assessment/Plan:  Cynthia Ramirez is a 62 y.o. 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 She has been taking triple therapy with HCQ, methotrexate and SSZ as she has been reluctant to try biologics. At last visit 09/2022 pt agreed to start Humira due to continued disease activity. She received for one month but then insurance changed so haven't received in last few weeks.     Seropositive rheumatoid arthritis  - continue methotrexate  20 mg once per week  - continue folic acid 1 mg daily  - Update monitoring labs today: CBCw/diff, Crt, AST, ALT  - Continue plan to restart Humira (or biosilimar) 40 mg every other week.Will speak with pharmacy team to ensure everything is needed for pt to receive Humira.     Immunization History   Administered Date(s) Administered Comments    COVID-19 VAC,BIVALENT(106YR UP),PFIZER 10/10/2021      04/11/2022     COVID-19 VACC,MRNA,(PFIZER)(PF) 03/11/2020 Per [facility]     04/01/2020 Per [facility]     09/22/2020     Covid-19 Vac, (36yr+) (Spikevax) Monovalent Xbb.1.5 Moder  10/23/2022     Influenza Vaccine Quad(IM)6 MO-Adult(PF) 09/11/2018      09/05/2019      09/29/2020      10/10/2021      10/03/2022     Influenza Virus Vaccine, unspecified formulation 03/11/2017      09/16/2019 [facility]    PNEUMOCOCCAL POLYSACCHARIDE 23-VALENT 09/29/2020     Pneumococcal Conjugate 13-Valent 05/17/2016     RSV VACCINE, BIVALENT (PF) (ABRYSVO) 11/14/2022     TdaP 09/20/2008 [facility]     F/U as scheduled in May with Dr. Berton Lan    I personally spent 36 minutes face-to-face and non-face-to-face in the care of this patient, which includes all pre, intra, and post visit time on the date of service.  All documented time was specific to the E/M visit and does not include any procedures that may have been performed.        HPI: Cynthia Ramirez is a 62 y.o.  female whom I met via video in March 2021 for a new patient encounter for the evaluation of erosive rheumatoid arthritis RF 130, CCP >250 . Previously followed at [facility] and last seen there in 2018. Previous treatment with methotrexate though she admits not taking it very often. She was not interested in biologics but they were discussed. She does not like to take medications. Her symptoms started after she took a bad fall on the job in 2016. She jammed her left hand. No fractures. She spent a year seeing physicians about the knee injury and eventually had surgery for a torn meniscus. She limped around for a year. She developed sciatica and worked with a Land which helped. She has the sciatic issue again which she thinks may be from coughing from her COPD.The top of her foot has a bony protuberance. She went to a podiatrist who gave her inserts which did not help. Her left 3rd PIP joint remains swollen. I checked xrays of her hands and feet and she has no erosive disease. She is wary of taking medications and given the fact that she has no erosions, we decided at last visit to try HCQ 400 mg daily. CRP was 36, ESR normal.  Tried arava but it did not help.  She was admitted in February for volume overload. Acute on chronic hypoxic respiratory failure 2/2 probable viral pneumonia and HFpEF. During her hospitalization  she spent all of her time in the ICU in bed. During that time her joint pain got extremely bad. Now involves her shoulders, knees in addition to wrists and ankles. She developed acute left knee pain about 10 days ago. No trauma. She tried elevating her legs, used compression socks. It is better now. Pulmonary rehab has been prescribed but she cannot walk. She is taking aleve alternating with tylenol. Aleve 2 every 12 hours. Having a very difficult time getting up out of a chair due to both weakness and pain.   Xray of the left knee shows Tricompartmental joint space narrowing osteophyte formation most proximal in the medial and lateral compartments. Moderate size joint effusion without layering fat fluid. Soft tissues are within normal limits.    Last Visit: 10/03/22 with Dr. Berton Lan. Humira started at this visit due to continued disease activity.     Interim History:   Pt presents for f/u today. She received her Humira for two doses but then insurance changed so has had it for a few weeks. She continues to have joint pain to ankles, feet and wrists. She has noticed swelling to top of her feet. She reports AM stiffness lasting throughout the day. She is struggling to sleep due to the pain. She is taking MTX weekly and FA daily. She is no longer on HCQ and SSZ since she is starting Humira. No recent fever.    MRI of the abdomen:   1.1 cm indeterminate left renal lesion, as above, incompletely characterized on single phase CT and new compared with 2018. Recommend short interval follow-up with MRI of the abdomen with and without contrast to evaluate for interval stability and characterization.      Subacute/chronic splenic infarct, new compared with 02/11/2017.      Hepatic lesions consistent with cysts/biliary versus biliary hamartomas, not substantially changed compared with 2018.         Allergies:  Doxycycline hcl and Sulfa (sulfonamide antibiotics)    Problem List  Patient Active Problem List   Diagnosis    Essential hypertension, benign    Tobacco use disorder    Family history of diabetes mellitus (DM)    Lumbar radiculopathy    Rheumatoid arthritis involving multiple sites with positive rheumatoid factor (CMS-HCC)    COPD (chronic obstructive pulmonary disease) (CMS-HCC)    Diabetes mellitus, type 2 (CMS-HCC)    Chronic diastolic heart failure (CMS-HCC)    Subcapital fracture of neck of right femur (CMS-HCC)    Lesion of left native kidney      Medical History:  Past Medical History:   Diagnosis Date    Abnormal mammogram     At risk for falls     Cognitive impairment     has a hard time remembering    COPD (chronic obstructive pulmonary disease) (CMS-HCC)     Diabetes mellitus (CMS-HCC)     Dizziness Essential hypertension, benign     Family history of diabetes mellitus (DM)     Financial difficulties     Hearing impairment     has a hole in her eardrum    HL (hearing loss)     Impaired mobility     Lack of access to transportation     Nonadherence to medication     Rheumatoid arthritis (CMS-HCC) 2017    Marked elevation of rheumatoid factor 130.5  and CCP>250    Tobacco use disorder     Visual impairment     hard to see per patient  Surgical History:  Past Surgical History:   Procedure Laterality Date    BREAST BIOPSY Left 02/2016    HYSTERECTOMY      TAH before age 4    INNER EAR SURGERY Bilateral     2004 and earlier -- unspecified nature of surgery    PILONIDAL CYST DRAINAGE      PR COLONOSCOPY FLX DX W/COLLJ SPEC WHEN PFRMD N/A 03/13/2016    Procedure: COLONOSCOPY, FLEXIBLE, PROXIMAL TO SPLENIC FLEXURE; DIAGNOSTIC, W/WO COLLECTION SPECIMEN BY BRUSH OR WASH;  Surgeon: Liane Comber, MD;  Location: [facility];  Service: Gastroenterology    PR FEMORAL FX, OPEN TX Right 07/19/2022    Procedure: HEMI OPEN TREATMENT OF FEMORAL FRACTURE, PROXIMAL END, NECK, INTERNAL FIXATION OR PROSTHETIC REPLACEMENT - modifier 22;  Surgeon: Valda Favia, MD;  Location: MAIN OR [facility];  Service: Orthopedics    right hip surgery Right 07/19/2022    TONSILLECTOMY Bilateral 1973 approx     Social History:  Social History     Tobacco Use    Smoking status: Former     Current packs/day: 0.00     Average packs/day: 1 pack/day for 40.0 years (40.0 ttl pk-yrs)     Types: Cigarettes     Start date: 01/19/1982     Quit date: 01/19/2022     Years since quitting: 0.9     Passive exposure: Past    Smokeless tobacco: Never    Tobacco comments:     Pt stated stopped since 01/21/22   Vaping Use    Vaping Use: Never used   Substance Use Topics    Alcohol use: Yes     Alcohol/week: 1.0 standard drink of alcohol     Types: 1 Cans of beer per week     Comment: Occasionally    Drug use: No   she works at [employer] in [place] Family History:  Family History   Problem Relation Age of Onset    Cancer Mother     Colon cancer Mother     Heart disease Father     Heart disease Sister     Diabetes Brother     Diabetes Brother     Diabetes Brother     Breast cancer Neg Hx      Review of Systems: Please see above in the HPI, the remainder of a 10-system review was unremarkable.    Objective   Vitals:    01/03/23 1246   BP: 120/77   Pulse: 83   Temp: 36.8 ??C (98.3 ??F)     Physical Exam  General:   In no distress, She continues to wear oxygen by nasal cannula.   Eyes:   conjunctivae are clear.   ENT:   MMM.    Skin:    No diffuse rash.   Musculo Skeletal:   Tenderness to b/l 3rd MCP with swelling.   Tenderness to L wrist. Good ROM to b/l wrists.   Flexion contracture to L elbow. Good ROM to R.   Good ROM of b/l knees w/o swelling.   Swelling and tenderness to R ankle.      Swollen Joint Count (0-28): 2  Tender Joint Count (0-28): 3  Patient Global Assessment of Disease Activity (0-10): 6  Evaluator Global Assessment of Disease (0-10): 5  CDAI Score: 16  CDAI interpretation:  0.0-2.8 Remission   2.9-10.0 Low disease activity   10.1-22.0 Moderate disease activity   22.1-76.0 High disease activity

## 2023-01-01 NOTE — Unmapped (Signed)
[facility] Specialty Medication Onboarding    Specialty Medication: HUMIRA(CF) PEN 40 mg/0.4 mL injection (adalimumab)  Prior Authorization: Approved   Financial Assistance: Yes - copay card approved as secondary   Final Copay/Day Supply: $5,952.99 / 28 days    Insurance Restrictions: Yes - max 1 month supply     Notes to Pharmacist: Pt may have debit card to assist with copay    The triage team has completed the benefits investigation and has determined that the patient is able to fill this medication at [facility]. Please contact the patient to complete the onboarding or follow up with the prescribing physician as needed.

## 2023-01-02 NOTE — Unmapped (Signed)
Received VM from [facility] stating they are waiting on a PLA for patients Humira. They stated that patient is very escalated and upset that she has missed doses.    Will forward to pharmacy team. Per last note, it is being worked on.

## 2023-01-02 NOTE — Unmapped (Signed)
[facility]   Patient Onboarding/Medication Counseling    [name] is a 62 y.o. female with RA who I am counseling today on continuation of therapy.  I am speaking to the patient.    Was a Nurse, learning disability used for this call? No    Verified patient's date of birth / HIPAA.    Specialty medication(s) to be sent: Inflammatory Disorders: Humira      Non-specialty medications/supplies to be sent: n/a      Medications not needed at this time: n/a         Humira (adalimumab)    The patient declined counseling on medication administration, missed dose instructions, goals of therapy, side effects and monitoring parameters, warnings and precautions, drug/food interactions, and storage, handling precautions, and disposal because they have taken the medication previously. The information in the declined sections below are for informational purposes only and was not discussed with patient.     Medication & Administration     Dosage: Rheumatoid arthritis: Inject 40mg  under the skin every 14 days    Lab tests required prior to treatment initiation:  Tuberculosis: Tuberculosis screening resulted in a non-reactive Quantiferon TB Gold assay.  Hepatitis B: Hepatitis B serology studies are complete and non-reactive.    Administration:     Prefilled auto-injector pen  1. Gather all supplies needed for injection on a clean, flat working surface: medication pen removed from packaging, alcohol swab, sharps container, etc.  2. Look at the medication label - look for correct medication, correct dose, and check the expiration date  3. Look at the medication - the liquid visible in the window on the side of the pen device should appear clear and colorless  4. Lay the auto-injector pen on a flat surface and allow it to warm up to room temperature for at least 30-45 minutes  5. Select injection site - you can use the front of your thigh or your belly (but not the area 2 inches around your belly button); if someone else is giving you the injection you can also use your upper arm in the skin covering your triceps muscle  6. Prepare injection site - wash your hands and clean the skin at the injection site with an alcohol swab and let it air dry, do not touch the injection site again before the injection  7. Pull the 2 safety caps straight off - gray/white to uncover the needle cover and the plum cap to uncover the plum activator button, do not remove until immediately prior to injection and do not touch the white needle cover  8. Gently squeeze the area of cleaned skin and hold it firmly to create a firm surface at the selected injection site  9. Put the white needle cover against your skin at the injection site at a 90 degree angle, hold the pen such that you can see the clear medication window  10. Press down and hold the pen firmly against your skin, press the plum activator button to initiate the injection, there will be a click when the injection starts  11. Continue to hold the pen firmly against your skin for about 10-15 seconds - the window will start to turn solid yellow  12. To verify the injection is complete after 10-15 seconds, look and ensure the window is solid yellow and then pull the pen away from your skin  13. Dispose of the used auto-injector pen immediately in your sharps disposal container the needle will be covered automatically  14. If you see any blood at the injection site, press a cotton ball or gauze on the site and maintain pressure until the bleeding stops, do not rub the injection site    Adherence/Missed dose instructions:  If your injection is given more than 3 days after your scheduled injection date - consult your pharmacist for additional instructions on how to adjust your dosing schedule.    Goals of Therapy     - Achieve symptom remission  - Slow disease progression  - Protection of remaining articular structures  - Maintenance of function  - Maintenance of effective psychosocial functioning    Side Effects & Monitoring Parameters     Injection site reaction (redness, irritation, inflammation localized to the site of administration)  Signs of a common cold - minor sore throat, runny or stuffy nose, etc.  Upset stomach  Headache    The following side effects should be reported to the provider:  Signs of a hypersensitivity reaction - rash; hives; itching; red, swollen, blistered, or peeling skin; wheezing; tightness in the chest or throat; difficulty breathing, swallowing, or talking; swelling of the mouth, face, lips, tongue, or throat; etc.  Reduced immune function - report signs of infection such as fever; chills; body aches; very bad sore throat; ear or sinus pain; cough; more sputum or change in color of sputum; pain with passing urine; wound that will not heal, etc.  Also at a slightly higher risk of some malignancies (mainly skin and blood cancers) due to this reduced immune function.  In the case of signs of infection - the patient should hold the next dose of Humira?? and call your primary care provider to ensure adequate medical care.  Treatment may be resumed when infection is treated and patient is asymptomatic.  Changes in skin - a new growth or lump that forms; changes in shape, size, or color of a previous mole or marking  Signs of unexplained bruising or bleeding - throwing up blood or emesis that looks like coffee grounds; black, tarry, or bloody stool; etc.  Signs of new or worsening heart failure - shortness of breath; sudden weight gain; heartbeat that is not normal; swelling in the arms or legs that is new or worse      Contraindications, Warnings, & Precautions     Have your bloodwork checked as you have been told by your prescriber  Talk with your doctor if you are pregnant, planning to become pregnant, or breastfeeding  Discuss the possible need for holding your dose(s) of Humira?? when a planned procedure is scheduled with the prescriber as it may delay healing/recovery timeline Drug/Food Interactions     Medication list reviewed in Epic. The patient was instructed to inform the care team before taking any new medications or supplements. No drug interactions identified.   Talk with you prescriber or pharmacist before receiving any live vaccinations while taking this medication and after you stop taking it    Storage, Handling Precautions, & Disposal     Store this medication in the refrigerator.  Do not freeze  If needed, you may store at room temperature for up to 14 days  Store in original packaging, protected from light  Do not shake  Dispose of used syringes/pens in a sharps disposal container          Current Medications (including OTC/herbals), Comorbidities and Allergies     Current Outpatient Medications   Medication Sig Dispense Refill    acetaminophen (TYLENOL) 325 MG tablet Take  2 tablets (650 mg total) by mouth two (2) times a day as needed.      ADALIMUMAB PEN CITRATE FREE 40 MG/0.4 ML Inject the contents of 1 pen (40 mg total) under the skin every fourteen (14) days. 6 each 3    albuterol HFA 90 mcg/actuation inhaler Inhale 2 puffs every six (6) hours as needed for wheezing. 8 g 11    ascorbic acid (VITAMIN C ORAL) Take 1 tablet by mouth daily.      atorvastatin (LIPITOR) 10 MG tablet Take 1 tablet (10 mg total) by mouth daily. 90 tablet 3    cholecalciferol, vitamin D3-50 mcg, 2,000 unit,, 50 mcg (2,000 unit) tablet Take 1 tablet (50 mcg total) by mouth daily. 2 tablets daily      cyclobenzaprine (FLEXERIL) 5 MG tablet Take 1 tablet (5 mg total) by mouth Three (3) times a day as needed for muscle spasms. (Patient not taking: Reported on 12/04/2022) 60 tablet 5    fluticasone propion-salmeteroL (ADVAIR HFA) 230-21 mcg/actuation inhaler Inhale 2 puffs two (2) times a day. (Patient taking differently: Inhale 2 puffs  in the morning.) 36 g 4    folic acid (FOLVITE) 1 MG tablet TAKE 1 TABLET BY MOUTH EVERY DAY 90 tablet 3    furosemide (LASIX) 40 MG tablet Take 1 tablet (40 mg total) by mouth daily. Take one tablet daily as needed for weight gain (goal weight ~248-251lbs) (Patient taking differently: Take 0.5 tablets (20 mg total) by mouth daily. Take one tablet daily as needed for edema. Do not administer if SBP<100.)  0    hydrOXYchloroQUINE (PLAQUENIL) 200 mg tablet Take 1 tablet (200 mg total) by mouth Two (2) times a day. (Patient not taking: Reported on 12/04/2022) 180 tablet 3    magnesium oxide (MAG-OX) 400 mg (241.3 mg elemental magnesium) tablet Take 1 tablet (400 mg total) by mouth daily. 90 tablet 3    meloxicam (MOBIC) 15 MG tablet Take 1 tablet (15 mg total) by mouth daily. 30 tablet 3    methotrexate 2.5 MG tablet Take 8 tablets (20 mg total) by mouth once a week. 96 tablet 3    multivitamin (TAB-A-VITE/THERAGRAN) per tablet Take 1 tablet by mouth daily.      omeprazole (PRILOSEC) 20 MG capsule TAKE 1 CAPSULE BY MOUTH EVERY DAY IN THE MORNING 90 capsule 4    penicillin v potassium (VEETID) 500 MG tablet Take 1 tablet (500 mg total) by mouth every eight (8) hours for 14 days. 42 tablet 0    potassium chloride 20 MEQ ER tablet Take 1 tablet (20 mEq total) by mouth daily. (Patient taking differently: Take 1 tablet (20 mEq total) by mouth daily. Take with Lasix) 90 tablet 3    sodium chloride (OCEAN) 0.65 % nasal spray 4 drops (1 spray total) as needed for congestion.      sulfaSALAzine (AZULFIDINE) 500 MG EC tablet Take 1 tablet (500 mg total) by mouth Two (2) times a day. (Patient not taking: Reported on 12/04/2022) 180 tablet 3    tiotropium bromide (SPIRIVA RESPIMAT) 2.5 mcg/actuation inhalation mist Inhale 2 puffs daily. 16 g 11    triamcinolone (KENALOG) 0.1 % ointment Apply topically Two (2) times a day. 15 g 0     No current facility-administered medications for this visit.       Allergies   Allergen Reactions    Doxycycline Hcl Hives    Sulfa (Sulfonamide Antibiotics) Other (See Comments)     Stomach issue  Patient Active Problem List   Diagnosis    Essential hypertension, benign    Tobacco use disorder    Family history of diabetes mellitus (DM)    Lumbar radiculopathy    Rheumatoid arthritis involving multiple sites with positive rheumatoid factor (CMS-HCC)    COPD (chronic obstructive pulmonary disease) (CMS-HCC)    Diabetes mellitus, type 2 (CMS-HCC)    Chronic diastolic heart failure (CMS-HCC)    Subcapital fracture of neck of right femur (CMS-HCC)    Lesion of left native kidney       Reviewed and up to date in Epic.    Appropriateness of Therapy     Acute infections noted within Epic:  No active infections  Patient reported infection: None    Is medication and dose appropriate based on diagnosis and infection status? Yes    Prescription has been clinically reviewed: Yes      Baseline Quality of Life Assessment      How many days over the past month did your condition  keep you from your normal activities? For example, brushing your teeth or getting up in the morning. 0    Financial Information     Medication Assistance provided: Prior Authorization and Copay Assistance    Anticipated copay of $0 reviewed with patient. Verified delivery address.    Delivery Information     Scheduled delivery date: 1/25    Expected start date: 1/25    Medication will be delivered via UPS to the prescription address in [facility].  This shipment will not require a signature.      Explained the services we provide at [facility] and that each month we would call to set up refills.  Stressed importance of returning phone calls so that we could ensure they receive their medications in time each month.  Informed patient that we should be setting up refills 7-10 days prior to when they will run out of medication.  A pharmacist will reach out to perform a clinical assessment periodically.  Informed patient that a welcome packet, containing information about our pharmacy and other support services, a Notice of Privacy Practices, and a drug information handout will be sent.      The patient or caregiver noted above participated in the development of this care plan and knows that they can request review of or adjustments to the care plan at any time.      Patient or caregiver verbalized understanding of the above information as well as how to contact the pharmacy at [phone] option 4 with any questions/concerns.  The pharmacy is open Monday through Friday 8:30am-4:30pm.  A pharmacist is available 24/7 via pager to answer any clinical questions they may have.    Patient Specific Needs     Does the patient have any physical, cognitive, or cultural barriers? No    Does the patient have adequate living arrangements? (i.e. the ability to store and take their medication appropriately) Yes    Did you identify any home environmental safety or security hazards? No    Patient prefers to have medications discussed with  Patient     Is the patient or caregiver able to read and understand education materials at a high school level or above? Yes    Patient's primary language is  English     Is the patient high risk? No    SOCIAL DETERMINANTS OF HEALTH     At the [facility], we have learned that life  circumstances - like trouble affording food, housing, utilities, or transportation can affect the health of many of our patients.   That is why we wanted to ask: are you currently experiencing any life circumstances that are negatively impacting your health and/or quality of life? Patient declined to answer    Social Determinants of Health     Financial Resource Strain: High Risk (11/07/2022)    Overall Financial Resource Strain (CARDIA)     Difficulty of Paying Living Expenses: Hard   Internet Connectivity: Possible Internet connectivity concern identified (11/07/2022)    Internet Connectivity     Do you have access to internet services: Yes     How do you connect to the internet: Personal Device at home     Is your internet connection strong enough for you to watch video on your device without major problems?: Patient Declined     Do you have enough data to get through the month?: No     Does at least one of the devices have a camera that you can use for video chat?: Yes   Food Insecurity: Food Insecurity Present (11/07/2022)    Hunger Vital Sign     Worried About Running Out of Food in the Last Year: Sometimes true     Ran Out of Food in the Last Year: Sometimes true   Tobacco Use: Medium Risk (11/16/2022)    Patient History     Smoking Tobacco Use: Former     Smokeless Tobacco Use: Never     Passive Exposure: Past   Housing/Utilities: High Risk (11/07/2022)    Housing/Utilities     Within the past 12 months, have you ever stayed: outside, in a car, in a tent, in an overnight shelter, or temporarily in someone else's home (i.e. couch-surfing)?: No     Are you worried about losing your housing?: Yes     Within the past 12 months, have you been unable to get utilities (heat, electricity) when it was really needed?: No   Alcohol Use: Not At Risk (11/07/2022)    Alcohol Use     How often do you have a drink containing alcohol?: Never     How many drinks containing alcohol do you have on a typical day when you are drinking?: 1 - 2     How often do you have 5 or more drinks on one occasion?: Never   Transportation Needs: Unmet Transportation Needs (11/07/2022)    PRAPARE - Transportation     Lack of Transportation (Medical): Yes     Lack of Transportation (Non-Medical): Yes   Substance Use: Low Risk  (11/07/2022)    Substance Use     Taken prescription drugs for non-medical reasons: Never     Taken illegal drugs: Never     Patient indicated they have taken drugs in the past year for non-medical reasons: Yes, [positive answer(s)]: Not on file   Health Literacy: Medium Risk (11/07/2022)    Health Literacy     : Sometimes   Physical Activity: Inactive (11/07/2022)    Exercise Vital Sign     Days of Exercise per Week: 0 days     Minutes of Exercise per Session: 0 min   Interpersonal Safety: Not at risk (11/07/2022)    Interpersonal Safety     Unsafe Where You Currently Live: No     Physically Hurt by Anyone: No     Abused by Anyone: No   Stress: Stress Concern Present (11/07/2022)  [employer] of Occupational Health - Occupational Stress Questionnaire     Feeling of Stress : Very much   Intimate Partner Violence: Not At Risk (11/07/2022)    Humiliation, Afraid, Rape, and Kick questionnaire     Fear of Current or Ex-Partner: No     Emotionally Abused: No     Physically Abused: No     Sexually Abused: No   Depression: Not at risk (11/13/2022)    PHQ-2     PHQ-2 Score: 2   Social Connections: Socially Isolated (11/07/2022)    Social Connection and Isolation Panel [NHANES]     Frequency of Communication with Friends and Family: Never     Frequency of Social Gatherings with Friends and Family: Never     Attends Religious Services: Never     Database administrator or Organizations: No     Attends Engineer, structural: Never     Marital Status: Living with partner       Would you be willing to receive help with any of the needs that you have identified today? Not applicable         Julianne Rice, PharmD  [facility] Specialty Pharmacist

## 2023-01-03 ENCOUNTER — Ambulatory Visit: Admit: 2023-01-03 | Discharge: 2023-01-04 | Payer: PRIVATE HEALTH INSURANCE | Attending: Family | Primary: Family

## 2023-01-03 DIAGNOSIS — M0579 Rheumatoid arthritis with rheumatoid factor of multiple sites without organ or systems involvement: Principal | ICD-10-CM

## 2023-01-03 LAB — CBC W/ AUTO DIFF
BASOPHILS ABSOLUTE COUNT: 0.1 10*9/L (ref 0.0–0.1)
BASOPHILS RELATIVE PERCENT: 0.9 %
EOSINOPHILS ABSOLUTE COUNT: 0.1 10*9/L (ref 0.0–0.5)
EOSINOPHILS RELATIVE PERCENT: 1.1 %
HEMATOCRIT: 36.1 % (ref 34.0–44.0)
HEMOGLOBIN: 11.5 g/dL (ref 11.3–14.9)
LYMPHOCYTES ABSOLUTE COUNT: 2 10*9/L (ref 1.1–3.6)
LYMPHOCYTES RELATIVE PERCENT: 20.6 %
MEAN CORPUSCULAR HEMOGLOBIN CONC: 31.9 g/dL — ABNORMAL LOW (ref 32.0–36.0)
MEAN CORPUSCULAR HEMOGLOBIN: 29.1 pg (ref 25.9–32.4)
MEAN CORPUSCULAR VOLUME: 91.5 fL (ref 77.6–95.7)
MEAN PLATELET VOLUME: 8.4 fL (ref 6.8–10.7)
MONOCYTES ABSOLUTE COUNT: 0.8 10*9/L (ref 0.3–0.8)
MONOCYTES RELATIVE PERCENT: 8.2 %
NEUTROPHILS ABSOLUTE COUNT: 6.8 10*9/L (ref 1.8–7.8)
NEUTROPHILS RELATIVE PERCENT: 69.2 %
PLATELET COUNT: 369 10*9/L (ref 150–450)
RED BLOOD CELL COUNT: 3.95 10*12/L (ref 3.95–5.13)
RED CELL DISTRIBUTION WIDTH: 18.7 % — ABNORMAL HIGH (ref 12.2–15.2)
WBC ADJUSTED: 9.8 10*9/L (ref 3.6–11.2)

## 2023-01-03 LAB — ALT: ALT (SGPT): 20 U/L (ref 10–49)

## 2023-01-03 LAB — CREATININE
CREATININE: 0.45 mg/dL — ABNORMAL LOW
EGFR CKD-EPI (2021) FEMALE: >90 mL/min/{1.73_m2} (ref >=60)

## 2023-01-03 LAB — AST: AST (SGOT): 27 U/L (ref <=34)

## 2023-01-05 NOTE — Unmapped (Signed)
COMPLEX CASE MANAGEMENT   FOLLOW UP NOTE  Summary:  Complex Case Manager  spoke with patient and verified correct patient using two identifiers today for Complex Case Management follow up. Patient currently resides at Home. Primary concern is getting Humira.      Subjective:    Patient/caregiver reported she has not started Humira d/t dealing with insurance not covering cost of medication. Patient was able to get assistance of Humira drug manufacturer to assist with providing coupon so patient will only have to pay $5.00 for refills for a year. Patient has also filled out paperwork for [facility] financial assistance, but has to resubmit more paperwork. Patient has been under a lot of stress dealing with insurance and finances to help cover costs of her healthcare. CM asked patient if she has had any falls and patient denies. Patient is currently using a walker to ambulate. CM discussed taking up throw rugs to prevent falls. Patient states her oxygen tubing gets in the way and is more of a trip hazard. CM asked patient if she is working with PT and patient is unable to work with PT d/t out of pocket expense. CM asked patient if she received ACP documents and patient has received, but has not been able complete them. CM will follow up with patient in 2 weeks.  Objective:     Screenings Completed during visit: Falls Risk    Barriers to care: Medication Affordability, Financial Stress, Mobility Impairment, Falls Risk, and Health Literacy    Interventions provided: Falls Prevention Education and Supportive Listening     Progress towards fall prevention, ACP, PT follow up goal : Completed 01/04/23    Plan:     1. Case Management to: Follow up in 2 weeks    2. Patient/caregiver to: call PCP if questions/concerns arise    Discuss at next outreach:  assess if patient has started Humira    Care Coordination Note updated in [facility]: Yes    Future Appointments   Date Time Provider Department Center   01/18/2023  4:30 PM Sherlyn Hay, RN [place] [facility]   02/14/2023  1:30 PM [facility] RM 1 [facility] [place] - [facility]   02/26/2023  3:25 PM Colford, Cristin Mulderig, MD [facility]   04/17/2023  1:00 PM Rosary Lively, MD [facility]   04/17/2023  1:30 PM PFT 3 [facility]   05/09/2023 11:20 AM Malvin Johns, DO [facility]       Problem List         High blood pressure      Family history of diabetes mellitus      Lumbar nerve root disorder      Rheumatoid arthritis involving multiple sites with positive rheumatoid factor      Chronic airway obstruction      Type 2 diabetes      Heart failure      Subcapital fracture of neck of right femur      Lesion of left native kidney       Allergies:   Allergies   Allergen Reactions    Doxycycline Hcl Hives    Sulfa (Sulfonamide Antibiotics) Other (See Comments)     Stomach issue       Medications:  Prior to Admission medications    Medication Dose, Route, Frequency   acetaminophen (TYLENOL) 325 MG tablet 650 mg, Oral, 2 times a day PRN   ADALIMUMAB PEN CITRATE FREE 40 MG/0.4 ML Inject the contents of  1 pen (40 mg total) under the skin every fourteen (14) days.   albuterol HFA 90 mcg/actuation inhaler 2 puffs, Inhalation, Every 6 hours PRN   ascorbic acid (VITAMIN C ORAL) 1 tablet, Oral, Daily (standard)   atorvastatin (LIPITOR) 10 MG tablet 10 mg, Oral, Daily (standard)   cholecalciferol, vitamin D3-50 mcg, 2,000 unit,, 50 mcg (2,000 unit) tablet 50 mcg, Oral, Daily (standard), 2 tablets daily   cyclobenzaprine (FLEXERIL) 5 MG tablet 5 mg, Oral, 3 times a day PRN  Patient not taking: Reported on 12/04/2022   fluticasone propion-salmeteroL (ADVAIR HFA) 230-21 mcg/actuation inhaler 2 puffs, Inhalation, 2 times a day (standard)  Patient taking differently: Inhale 2 puffs  in the morning.   folic acid (FOLVITE) 1 MG tablet 1,000 mcg, Oral, Daily (standard)   furosemide (LASIX) 40 MG tablet 40 mg, Oral, Daily (standard), Take one tablet daily as needed for weight gain (goal weight ~248-251lbs)  Patient taking differently: Take 0.5 tablets (20 mg total) by mouth daily. Take one tablet daily as needed for edema. Do not administer if SBP<100.   hydrOXYchloroQUINE (PLAQUENIL) 200 mg tablet 200 mg, Oral, 2 times a day (standard)  Patient not taking: Reported on 12/04/2022   magnesium oxide (MAG-OX) 400 mg (241.3 mg elemental magnesium) tablet 400 mg, Oral, Daily (standard)   meloxicam (MOBIC) 15 MG tablet 15 mg, Oral, Daily (standard)  Patient not taking: Reported on 01/03/2023   methotrexate 2.5 MG tablet 20 mg, Oral, Weekly   multivitamin (TAB-A-VITE/THERAGRAN) per tablet 1 tablet, Oral, Daily (standard)   omeprazole (PRILOSEC) 20 MG capsule TAKE 1 CAPSULE BY MOUTH EVERY DAY IN THE MORNING   potassium chloride 20 MEQ ER tablet 20 mEq, Oral, Daily (standard)  Patient not taking: Reported on 01/03/2023   sodium chloride (OCEAN) 0.65 % nasal spray 1 spray, As needed (once a day)  Patient not taking: Reported on 01/03/2023   sulfaSALAzine (AZULFIDINE) 500 MG EC tablet 500 mg, Oral, 2 times a day (standard)  Patient not taking: Reported on 12/04/2022   tiotropium bromide (SPIRIVA RESPIMAT) 2.5 mcg/actuation inhalation mist 5 mcg, Inhalation, Daily (standard)   triamcinolone (KENALOG) 0.1 % ointment Topical, 2 times a day (standard)   umeclidinium-vilanteroL (ANORO ELLIPTA) 62.5-25 mcg/actuation inhaler Inhale 1 puff BY MOUTH daily. For benefit inquiry only. Please send Epic chat message to Sejal K with coverage information, then discontinue Rx.          Sherlyn Hay, RN  01/04/2023

## 2023-01-08 NOTE — Unmapped (Signed)
The PAC has received an incoming call regarding a paperwork request:    Type/name of paperwork, and request: [name]    Who should paperwork be sent back to: [facility]  Fax/email/address: [phone]    Best callback number if any questions: [phone]  Caller's Name: Cynthia Ramirez needs the date for order number: [id number] to be on the line, she stated it will not go through on there end since date is not on the actual line.

## 2023-01-08 NOTE — Unmapped (Signed)
Cynthia Ramirez 's HUMIRA(CF) PEN 40 mg/0.4 mL injection (adalimumab) shipment will be delayed as a result of copay is now approved by patient/caregiver.  (Have collected updated debit card info and [employer] PA #)    I have reached out to the patient  at [phone] and communicated the delivery change. We will reschedule the medication for the delivery date that the patient agreed upon.  We have confirmed the delivery date as 01/10/23

## 2023-01-08 NOTE — Unmapped (Signed)
[facility] - PHARMACIST NOTES    Received call from patient - [employer] added additional funds to Humira copay card and she would like to receive her medication from [facility] as soon as possible. [facility] informed.     Chelsea Aus

## 2023-01-19 NOTE — Unmapped (Signed)
COMPLEX CASE MANAGEMENT   FOLLOW UP NOTE  Summary:  Complex Case Manager  spoke with patient and verified correct patient using two identifiers today for Complex Case Management follow up. Patient currently resides at Home. Primary concern is getting financial assistance.      Subjective:    Patient/caregiver reported she submitted financial documents via mail. Patient did note that on mychart her status shows as pending. CM discussed with patient that if she wants to check and see if [facility] financial assistance received her documents, she can call and ask. Patient voiced understanding. CM asked patient if she was able to get the Humira and patient stated that she started it on Monday. Patient was given a debit card and has a $5.00 copay for her medication. CM will follow up with patient in 3 weeks and will graduate patient from CCM services.     Objective:     Screenings Completed during visit: None completed at this visit    Barriers to care: Financial Stress and Health Literacy    Interventions provided: Supportive Listening     Progress towards obtaining Humira goal : Completed 01/18/23    Plan:     1. Case Management to: Follow up in 3 weeks    2. Patient/caregiver to: call PCP if questions/concerns arise    Discuss at next outreach: Graduation from Complex Case Management    Care Coordination Note updated in [facility]: Yes    Future Appointments   Date Time Provider Department Center   02/14/2023  1:30 PM HBR CT RM 1 [facility] [place] [facility]   02/26/2023  3:25 PM Colford, Cristin Mulderig, MD [facility]   04/17/2023  1:00 PM Rosary Lively, MD [facility]   04/17/2023  1:30 PM PFT 3 [facility]   05/09/2023 11:20 AM Malvin Johns, DO [facility]       Problem List         High blood pressure      Family history of diabetes mellitus      Lumbar nerve root disorder      Rheumatoid arthritis involving multiple sites with positive rheumatoid factor      Chronic airway obstruction      Type 2 diabetes      Heart failure      Subcapital fracture of neck of right femur      Lesion of left native kidney       Allergies:   Allergies   Allergen Reactions    Doxycycline Hcl Hives    Sulfa (Sulfonamide Antibiotics) Other (See Comments)     Stomach issue       Medications:  Prior to Admission medications    Medication Dose, Route, Frequency   acetaminophen (TYLENOL) 325 MG tablet 650 mg, Oral, 2 times a day PRN   ADALIMUMAB PEN CITRATE FREE 40 MG/0.4 ML Inject the contents of 1 pen (40 mg total) under the skin every fourteen (14) days.   albuterol HFA 90 mcg/actuation inhaler 2 puffs, Inhalation, Every 6 hours PRN   ascorbic acid (VITAMIN C ORAL) 1 tablet, Oral, Daily (standard)   atorvastatin (LIPITOR) 10 MG tablet 10 mg, Oral, Daily (standard)   cholecalciferol, vitamin D3-50 mcg, 2,000 unit,, 50 mcg (2,000 unit) tablet 50 mcg, Oral, Daily (standard), 2 tablets daily   cyclobenzaprine (FLEXERIL) 5 MG tablet 5 mg, Oral, 3 times a day PRN  Patient not taking: Reported on 12/04/2022   fluticasone propion-salmeteroL (ADVAIR HFA) 230-21 mcg/actuation  inhaler 2 puffs, Inhalation, 2 times a day (standard)  Patient taking differently: Inhale 2 puffs  in the morning.   folic acid (FOLVITE) 1 MG tablet 1,000 mcg, Oral, Daily (standard)   furosemide (LASIX) 40 MG tablet 40 mg, Oral, Daily (standard), Take one tablet daily as needed for weight gain (goal weight ~248-251lbs)  Patient taking differently: Take 0.5 tablets (20 mg total) by mouth daily. Take one tablet daily as needed for edema. Do not administer if SBP<100.   hydrOXYchloroQUINE (PLAQUENIL) 200 mg tablet 200 mg, Oral, 2 times a day (standard)  Patient not taking: Reported on 12/04/2022   magnesium oxide (MAG-OX) 400 mg (241.3 mg elemental magnesium) tablet 400 mg, Oral, Daily (standard)   meloxicam (MOBIC) 15 MG tablet 15 mg, Oral, Daily (standard)  Patient not taking: Reported on 01/03/2023   methotrexate 2.5 MG tablet 20 mg, Oral, Weekly   multivitamin (TAB-A-VITE/THERAGRAN) per tablet 1 tablet, Oral, Daily (standard)   omeprazole (PRILOSEC) 20 MG capsule TAKE 1 CAPSULE BY MOUTH EVERY DAY IN THE MORNING   potassium chloride 20 MEQ ER tablet 20 mEq, Oral, Daily (standard)  Patient not taking: Reported on 01/03/2023   sodium chloride (OCEAN) 0.65 % nasal spray 1 spray, As needed (once a day)  Patient not taking: Reported on 01/03/2023   sulfaSALAzine (AZULFIDINE) 500 MG EC tablet 500 mg, Oral, 2 times a day (standard)  Patient not taking: Reported on 12/04/2022   tiotropium bromide (SPIRIVA RESPIMAT) 2.5 mcg/actuation inhalation mist 5 mcg, Inhalation, Daily (standard)   triamcinolone (KENALOG) 0.1 % ointment Topical, 2 times a day (standard)   umeclidinium-vilanteroL (ANORO ELLIPTA) 62.5-25 mcg/actuation inhaler Inhale 1 puff BY MOUTH daily. For benefit inquiry only. Please send Epic chat message to Sejal K with coverage information, then discontinue Rx.          Sherlyn Hay, RN  01/18/2023

## 2023-01-21 NOTE — Unmapped (Signed)
[facility] Clinical Assessment & Refill Coordination Note    Cynthia Ramirez, DOB: 06-03-1961  Phone: [phone] (home)     All above HIPAA information was verified with patient.     Was a Nurse, learning disability used for this call? No    Specialty Medication(s):   Inflammatory Disorders: Humira     Current Outpatient Medications   Medication Sig Dispense Refill    acetaminophen (TYLENOL) 325 MG tablet Take 2 tablets (650 mg total) by mouth two (2) times a day as needed.      ADALIMUMAB PEN CITRATE FREE 40 MG/0.4 ML Inject the contents of 1 pen (40 mg total) under the skin every fourteen (14) days. 6 each 3    albuterol HFA 90 mcg/actuation inhaler Inhale 2 puffs every six (6) hours as needed for wheezing. 8 g 11    ascorbic acid (VITAMIN C ORAL) Take 1 tablet by mouth daily.      atorvastatin (LIPITOR) 10 MG tablet Take 1 tablet (10 mg total) by mouth daily. 90 tablet 3    cholecalciferol, vitamin D3-50 mcg, 2,000 unit,, 50 mcg (2,000 unit) tablet Take 1 tablet (50 mcg total) by mouth daily. 2 tablets daily      cyclobenzaprine (FLEXERIL) 5 MG tablet Take 1 tablet (5 mg total) by mouth Three (3) times a day as needed for muscle spasms. (Patient not taking: Reported on 12/04/2022) 60 tablet 5    fluticasone propion-salmeteroL (ADVAIR HFA) 230-21 mcg/actuation inhaler Inhale 2 puffs two (2) times a day. (Patient taking differently: Inhale 2 puffs  in the morning.) 36 g 4    folic acid (FOLVITE) 1 MG tablet TAKE 1 TABLET BY MOUTH EVERY DAY 90 tablet 3    furosemide (LASIX) 40 MG tablet Take 1 tablet (40 mg total) by mouth daily. Take one tablet daily as needed for weight gain (goal weight ~248-251lbs) (Patient taking differently: Take 0.5 tablets (20 mg total) by mouth daily. Take one tablet daily as needed for edema. Do not administer if SBP<100.)  0    hydrOXYchloroQUINE (PLAQUENIL) 200 mg tablet Take 1 tablet (200 mg total) by mouth Two (2) times a day. (Patient not taking: Reported on 12/04/2022) 180 tablet 3    magnesium oxide (MAG-OX) 400 mg (241.3 mg elemental magnesium) tablet Take 1 tablet (400 mg total) by mouth daily. 90 tablet 3    meloxicam (MOBIC) 15 MG tablet Take 1 tablet (15 mg total) by mouth daily. (Patient not taking: Reported on 01/03/2023) 30 tablet 3    methotrexate 2.5 MG tablet Take 8 tablets (20 mg total) by mouth once a week. 96 tablet 3    multivitamin (TAB-A-VITE/THERAGRAN) per tablet Take 1 tablet by mouth daily.      omeprazole (PRILOSEC) 20 MG capsule TAKE 1 CAPSULE BY MOUTH EVERY DAY IN THE MORNING 90 capsule 4    potassium chloride 20 MEQ ER tablet Take 1 tablet (20 mEq total) by mouth daily. (Patient not taking: Reported on 01/03/2023) 90 tablet 3    sodium chloride (OCEAN) 0.65 % nasal spray 4 drops (1 spray total) as needed for congestion. (Patient not taking: Reported on 01/03/2023)      sulfaSALAzine (AZULFIDINE) 500 MG EC tablet Take 1 tablet (500 mg total) by mouth Two (2) times a day. (Patient not taking: Reported on 12/04/2022) 180 tablet 3    tiotropium bromide (SPIRIVA RESPIMAT) 2.5 mcg/actuation inhalation mist Inhale 2 puffs daily. 16 g 11    triamcinolone (KENALOG) 0.1 %  ointment Apply topically Two (2) times a day. 15 g 0     No current facility-administered medications for this visit.        Changes to medications: Nakeita reports no changes at this time.    Allergies   Allergen Reactions    Doxycycline Hcl Hives    Sulfa (Sulfonamide Antibiotics) Other (See Comments)     Stomach issue       Changes to allergies: No    SPECIALTY MEDICATION ADHERENCE     Humira  40 mg/ml: 14 days of medicine on hand     Medication Adherence    Specialty Medication: Humira 40 mg q 14 days  Patient is on additional specialty medications: No  Patient is on more than two specialty medications: No  Informant: patient                            Specialty medication(s) dose(s) confirmed: Regimen is correct and unchanged.     Are there any concerns with adherence? No    Adherence counseling provided? Not needed    CLINICAL MANAGEMENT AND INTERVENTION      Clinical Benefit Assessment:    Do you feel the medicine is effective or helping your condition? Yes    Clinical Benefit counseling provided? Not needed    Adverse Effects Assessment:    Are you experiencing any side effects? No    Are you experiencing difficulty administering your medicine? No    Quality of Life Assessment:    Quality of Life    Rheumatology  1. What impact has your specialty medication had on the reduction of your daily pain level?: Tremendous  2. What impact has your specialty medication had on your ability to complete daily tasks (prepare meals, get dressed, etc...)?Marland Kitchen Tremendous  Oncology  Dermatology  Cystic Fibrosis          How many days over the past month did your RA  keep you from your normal activities? For example, brushing your teeth or getting up in the morning. 0    Have you discussed this with your provider? Not needed    Acute Infection Status:    Acute infections noted within Epic:  No active infections  Patient reported infection: None    Therapy Appropriateness:    Is therapy appropriate and patient progressing towards therapeutic goals? Yes, therapy is appropriate and should be continued    DISEASE/MEDICATION-SPECIFIC INFORMATION      For patients on injectable medications: Patient currently has 1 doses left.  Next injection is scheduled for later this week.    Chronic Inflammatory Diseases: Have you experienced any flares in the last month? No    PATIENT SPECIFIC NEEDS     Does the patient have any physical, cognitive, or cultural barriers? No    Is the patient high risk? No    Did the patient require a clinical intervention? No    Does the patient require physician intervention or other additional services (i.e., nutrition, smoking cessation, social work)? No    SOCIAL DETERMINANTS OF HEALTH     At the [facility], we have learned that life circumstances - like trouble affording food, housing, utilities, or transportation can affect the health of many of our patients.   That is why we wanted to ask: are you currently experiencing any life circumstances that are negatively impacting your health and/or quality of life? Patient declined to answer    Social  Determinants of Health     Financial Resource Strain: High Risk (11/07/2022)    Overall Financial Resource Strain (CARDIA)     Difficulty of Paying Living Expenses: Hard   Internet Connectivity: Possible Internet connectivity concern identified (11/07/2022)    Internet Connectivity     Do you have access to internet services: Yes     How do you connect to the internet: Personal Device at home     Is your internet connection strong enough for you to watch video on your device without major problems?: Patient Declined     Do you have enough data to get through the month?: No     Does at least one of the devices have a camera that you can use for video chat?: Yes   Food Insecurity: Food Insecurity Present (11/07/2022)    Hunger Vital Sign     Worried About Running Out of Food in the Last Year: Sometimes true     Ran Out of Food in the Last Year: Sometimes true   Tobacco Use: Medium Risk (01/16/2023)    Patient History     Smoking Tobacco Use: Former     Smokeless Tobacco Use: Never     Passive Exposure: Past   Housing/Utilities: High Risk (11/07/2022)    Housing/Utilities     Within the past 12 months, have you ever stayed: outside, in a car, in a tent, in an overnight shelter, or temporarily in someone else's home (i.e. couch-surfing)?: No     Are you worried about losing your housing?: Yes     Within the past 12 months, have you been unable to get utilities (heat, electricity) when it was really needed?: No   Alcohol Use: Not At Risk (11/07/2022)    Alcohol Use     How often do you have a drink containing alcohol?: Never     How many drinks containing alcohol do you have on a typical day when you are drinking?: 1 - 2     How often do you have 5 or more drinks on one occasion?: Never   Transportation Needs: Unmet Transportation Needs (11/07/2022)    PRAPARE - Transportation     Lack of Transportation (Medical): Yes     Lack of Transportation (Non-Medical): Yes   Substance Use: Low Risk  (11/07/2022)    Substance Use     Taken prescription drugs for non-medical reasons: Never     Taken illegal drugs: Never     Patient indicated they have taken drugs in the past year for non-medical reasons: Yes, [positive answer(s)]: Not on file   Health Literacy: Medium Risk (11/07/2022)    Health Literacy     : Sometimes   Physical Activity: Inactive (11/07/2022)    Exercise Vital Sign     Days of Exercise per Week: 0 days     Minutes of Exercise per Session: 0 min   Interpersonal Safety: Not at risk (11/07/2022)    Interpersonal Safety     Unsafe Where You Currently Live: No     Physically Hurt by Anyone: No     Abused by Anyone: No   Stress: Stress Concern Present (11/07/2022)    [employer] of Occupational Health - Occupational Stress Questionnaire     Feeling of Stress : Very much   Intimate Partner Violence: Not At Risk (11/07/2022)    Humiliation, Afraid, Rape, and Kick questionnaire     Fear of Current or Ex-Partner: No     Emotionally Abused:  No     Physically Abused: No     Sexually Abused: No   Depression: Not at risk (11/13/2022)    PHQ-2     PHQ-2 Score: 2   Social Connections: Socially Isolated (11/07/2022)    Social Connection and Isolation Panel [NHANES]     Frequency of Communication with Friends and Family: Never     Frequency of Social Gatherings with Friends and Family: Never     Attends Religious Services: Never     Database administrator or Organizations: No     Attends Engineer, structural: Never     Marital Status: Living with partner       Would you be willing to receive help with any of the needs that you have identified today? Not applicable       SHIPPING     Specialty Medication(s) to be Shipped:   Inflammatory Disorders: Humira    Other medication(s) to be shipped: No additional medications requested for fill at this time     Changes to insurance: No    Delivery Scheduled: Yes, Expected medication delivery date: 01/29/23.     Medication will be delivered via Same Day Courier to the confirmed prescription address in [facility].    The patient will receive a drug information handout for each medication shipped and additional FDA Medication Guides as required.  Verified that patient has previously received a Conservation officer, historic buildings and a Surveyor, mining.    The patient or caregiver noted above participated in the development of this care plan and knows that they can request review of or adjustments to the care plan at any time.      All of the patient's questions and concerns have been addressed.    Sherral Hammers, PharmD   [facility] Specialty Pharmacist

## 2023-01-22 NOTE — Unmapped (Signed)
The [facility] has received an incoming call regarding a paperwork request:    Type/name of paperwork, and request:  Home Health Certification and Plan of Care          Order # [id number] & [phone]  Who should paperwork be sent back to: [facility]   Fax/email/address: [phone]    Best callback number if any questions: [facility]   Caller's Name: [phone]    Please make sure on the order it states that this order was electronic signature.

## 2023-01-22 NOTE — Unmapped (Signed)
I can't sign anything in the shared folder right now. If someone can print I will sign.   CC

## 2023-01-29 MED FILL — HUMIRA PEN CITRATE FREE 40 MG/0.4 ML: SUBCUTANEOUS | 28 days supply | Qty: 2 | Fill #1

## 2023-02-06 NOTE — Unmapped (Signed)
Complex Case Management  SUMMARY NOTE    Attempted to contact pt today at Cell number to resolve Complex Case Management services. Left message to return call.; 1st attempt    Discuss at next visit: Graduation from Complex Case Management

## 2023-02-07 NOTE — Unmapped (Signed)
Complex Case Management   Final Call- Program Completion  Summary:   Case Manager spoke with patient today to resolve Complex Case Management services. Patient currently resides at Home.    Patient/caregiver reported she sent in financial information to financial aid and received letter via mail last Friday that said she was denied. CM shared with patient that financial aid shows it is pending as of 02/01/23. CM suggested that patient call and talk with someone. Patient voiced understanding and will follow up. Patient also reports her left ankle has been swelling. Patient is wondering if Humira is the cause of the swollen ankle? Patient states there is a Humira nurse that came out to show her how to inject the medication and provide teaching and may outreach to her. CM suggested that patient outreach to the Humira nurse to see if it could be a common side effect. If that is not helpful , patient needs to follow up with rheumatology or PCP regarding left ankle issue. Patient has been icing, using heat, and elevating her leg. Patient denies any other issues with her L leg. CM discussed graduation from CCM services. Patient voiced understanding.    How would you rate your overall health right now?  (Scale of 1-5) 3- Good     Health Literacy: How confident are you that you understand your health issues/concerns, can participate in your care, and manage your care along with your physician (Scale of 1-3): 2- Somewhat confident    Plan:   Provided PCP office number and after-hours nurse line information for future care needs  02/26/23    Care Coordination Note updated in [facility]: Yes    Pt Care Coordination Note:  This patient completed Complex Case Management services on 02/06/2023.     The following barriers were addressed: Health Literacy    The following interventions were provided: Supportive Listening     If this patient develops new chronic conditions, new opportunities to resolve barriers, or other areas of need, please send an AMB Referral to Case Management to the Personal Health Advocate Department.

## 2023-02-07 NOTE — Unmapped (Signed)
Complex Case Management  SUMMARY NOTE    Attempted to contact pt today at Cell number to resolve Complex Case Management services. Left message to return call.; 2nd attempt, letter sent.    Discuss at next visit: No answer, letter sent

## 2023-02-12 NOTE — Unmapped (Signed)
[facility] Refill Coordination Note    Specialty Medication(s) to be Shipped:   Inflammatory Disorders: Humira    Other medication(s) to be shipped: No additional medications requested for fill at this time     Cynthia Ramirez, DOB: 08/29/1961  Phone: [phone] (home)       All above HIPAA information was verified with patient.     Was a Nurse, learning disability used for this call? No    Completed refill call assessment today to schedule patient's medication shipment from the [facility] [phone]).  All relevant notes have been reviewed.     Specialty medication(s) and dose(s) confirmed: Regimen is correct and unchanged.   Changes to medications: Sharron reports no changes at this time.  Changes to insurance: No  New side effects reported not previously addressed with a pharmacist or physician: None reported  Questions for the pharmacist: No    Confirmed patient received a Conservation officer, historic buildings and a Surveyor, mining with first shipment. The patient will receive a drug information handout for each medication shipped and additional FDA Medication Guides as required.       DISEASE/MEDICATION-SPECIFIC INFORMATION        For patients on injectable medications: Patient currently has 1 doses left.  Next injection is scheduled for 02/25/2023.    SPECIALTY MEDICATION ADHERENCE     Medication Adherence    Patient reported X missed doses in the last month: 0  Specialty Medication: HUMIRA(CF) PEN 40 mg/0.4 mL injection (adalimumab)  Patient is on additional specialty medications: No  Informant: patient  Reliability of informant: reliable  Confirmed plan for next specialty medication refill: delivery by pharmacy  Refills needed for supportive medications: not needed          Refill Coordination    Has the Patients' Contact Information Changed: No  Is the Shipping Address Different: No         Were doses missed due to medication being on hold? No    HUMIRA(CF) PEN 40 mg/0.4 mL injection (adalimumab) : 14 days of medicine on hand       REFERRAL TO PHARMACIST     Referral to the pharmacist: Not needed      [facility]     Shipping address confirmed in Epic.     Patient was notified of new phone menu : Yes    Delivery Scheduled: Yes, Expected medication delivery date: 02/25/2023.     Medication will be delivered via Same Day Courier to the prescription address in Epic WAM.    Yolonda Kida   [facility] Specialty Technician

## 2023-02-13 ENCOUNTER — Ambulatory Visit: Admit: 2023-02-13 | Discharge: 2023-02-14 | Payer: PRIVATE HEALTH INSURANCE

## 2023-02-16 NOTE — Unmapped (Signed)
Brief Telephone Call    The patient was updated on her CT results which is listed below. The patient is aware to check in with her PCP regarding the coronary calcification, renal lesion, breast asymmetry. In regards to possible signs of pulmonary hypertension, she had a TTE in July 2023 with mild RV dilation which we have been following. She had Feb-March 2023 BiPAP compliance report which is reassuring. She is pending f/u appt with me with PFTs on 04/17/23. We will wait for that appointment to decide on next steps in workup for possible pulmonary hypertension (likely repeat TTE July 2024 and referral to pulmonary hypertension clinic if abnormal).    All questions were answered    Rosary Lively, MD  Pulmonary and Critical Care Fellow  Personal Pager: [phone] - 24-hr consult pager: [phone]         CT chest 02/13/23 report  -Continue annual screening with low dose CT chest in 12 months.  -Consider correlation with echocardiography for evidence of pulmonary hypertension.  -Recommend MRI of the abdomen with and without contrast for further evaluation of right upper pole renal lesion.  -- Recommend primary care physician evaluation for assessment for coronary calcifications.  -- Recommend mammography for better characterization of asymmetrically prominent left breast soft tissue.

## 2023-02-25 MED FILL — HUMIRA PEN CITRATE FREE 40 MG/0.4 ML: SUBCUTANEOUS | 28 days supply | Qty: 2 | Fill #2

## 2023-02-26 ENCOUNTER — Ambulatory Visit: Admit: 2023-02-26 | Discharge: 2023-02-27 | Payer: PRIVATE HEALTH INSURANCE

## 2023-02-26 DIAGNOSIS — R935 Abnormal findings on diagnostic imaging of other abdominal regions, including retroperitoneum: Principal | ICD-10-CM

## 2023-02-26 DIAGNOSIS — S72011D Unspecified intracapsular fracture of right femur, subsequent encounter for closed fracture with routine healing: Principal | ICD-10-CM

## 2023-02-26 DIAGNOSIS — N2889 Other specified disorders of kidney and ureter: Principal | ICD-10-CM

## 2023-02-26 DIAGNOSIS — J449 Chronic obstructive pulmonary disease, unspecified: Principal | ICD-10-CM

## 2023-02-26 DIAGNOSIS — I5032 Chronic diastolic (congestive) heart failure: Principal | ICD-10-CM

## 2023-02-26 DIAGNOSIS — I10 Essential (primary) hypertension: Principal | ICD-10-CM

## 2023-02-26 DIAGNOSIS — E119 Type 2 diabetes mellitus without complications: Principal | ICD-10-CM

## 2023-02-26 DIAGNOSIS — K047 Periapical abscess without sinus: Principal | ICD-10-CM

## 2023-02-26 DIAGNOSIS — M06329 Rheumatoid nodule, unspecified elbow: Principal | ICD-10-CM

## 2023-02-26 DIAGNOSIS — M0579 Rheumatoid arthritis with rheumatoid factor of multiple sites without organ or systems involvement: Principal | ICD-10-CM

## 2023-02-26 LAB — CBC W/ AUTO DIFF
BASOPHILS ABSOLUTE COUNT: 0.1 10*9/L (ref 0.0–0.1)
BASOPHILS RELATIVE PERCENT: 0.7 %
EOSINOPHILS ABSOLUTE COUNT: 0.2 10*9/L (ref 0.0–0.5)
EOSINOPHILS RELATIVE PERCENT: 1.8 %
HEMATOCRIT: 33.1 % — ABNORMAL LOW (ref 34.0–44.0)
HEMOGLOBIN: 11 g/dL — ABNORMAL LOW (ref 11.3–14.9)
LYMPHOCYTES ABSOLUTE COUNT: 1.7 10*9/L (ref 1.1–3.6)
LYMPHOCYTES RELATIVE PERCENT: 16.5 %
MEAN CORPUSCULAR HEMOGLOBIN CONC: 33.1 g/dL (ref 32.0–36.0)
MEAN CORPUSCULAR HEMOGLOBIN: 30.1 pg (ref 25.9–32.4)
MEAN CORPUSCULAR VOLUME: 91 fL (ref 77.6–95.7)
MEAN PLATELET VOLUME: 7.8 fL (ref 6.8–10.7)
MONOCYTES ABSOLUTE COUNT: 0.8 10*9/L (ref 0.3–0.8)
MONOCYTES RELATIVE PERCENT: 7.6 %
NEUTROPHILS ABSOLUTE COUNT: 7.6 10*9/L (ref 1.8–7.8)
NEUTROPHILS RELATIVE PERCENT: 73.4 %
PLATELET COUNT: 394 10*9/L (ref 150–450)
RED BLOOD CELL COUNT: 3.64 10*12/L — ABNORMAL LOW (ref 3.95–5.13)
RED CELL DISTRIBUTION WIDTH: 16.5 % — ABNORMAL HIGH (ref 12.2–15.2)
WBC ADJUSTED: 10.4 10*9/L (ref 3.6–11.2)

## 2023-02-26 LAB — HEMOGLOBIN A1C
ESTIMATED AVERAGE GLUCOSE: 131 mg/dL
HEMOGLOBIN A1C: 6.2 % — ABNORMAL HIGH (ref 4.8–5.6)

## 2023-02-26 LAB — BASIC METABOLIC PANEL
ANION GAP: 7 mmol/L (ref 5–14)
BLOOD UREA NITROGEN: 14 mg/dL (ref 9–23)
BUN / CREAT RATIO: 29
CALCIUM: 8.9 mg/dL (ref 8.7–10.4)
CHLORIDE: 104 mmol/L (ref 98–107)
CO2: 29.5 mmol/L (ref 20.0–31.0)
CREATININE: 0.48 mg/dL — ABNORMAL LOW
EGFR CKD-EPI (2021) FEMALE: >90 mL/min/{1.73_m2} (ref >=60)
GLUCOSE RANDOM: 182 mg/dL — ABNORMAL HIGH (ref 70–179)
POTASSIUM: 3.6 mmol/L (ref 3.4–4.8)
SODIUM: 140 mmol/L (ref 135–145)

## 2023-02-26 LAB — MAGNESIUM: MAGNESIUM: 1.7 mg/dL (ref 1.6–2.6)

## 2023-02-26 MED ORDER — ATORVASTATIN 40 MG TABLET
ORAL_TABLET | Freq: Every day | ORAL | 3 refills | 90 days | Status: CP
Start: 2023-02-26 — End: 2024-02-26

## 2023-02-26 MED ORDER — AMOXICILLIN 500 MG CAPSULE
ORAL_CAPSULE | Freq: Three times a day (TID) | ORAL | 0 refills | 5 days | Status: CP
Start: 2023-02-26 — End: 2023-03-03

## 2023-02-26 NOTE — Unmapped (Addendum)
Increase atorvastatin to 40 mg daily  Check labs today  Colon cancer screening was done in 2017 can wait until 2027      MRI to follow the kidney lesions    Denise--referral put in for Dr. Olga Millers. You can call ortho to schedule.

## 2023-02-26 NOTE — Unmapped (Unsigned)
[facility] at [facility]     Type of visit: face to face    Are you located in [place]? (for virtual visits only) N/A    Reason for visit: Follow up    Questions / Concerns that need to be addressed: gp over Ct Scan        Omron BPs (complete if screening BP has a systolic  > 130 or diastolic > 80)    HCDM reviewed and updated in Epic:    We are working to make sure all of our patients??? wishes are updated in Epic and part of that is documenting a Environmental health practitioner for each patient  A Health Care Decision Maker is someone you choose who can make health care decisions for you if you are not able - who would you most want to do this for you????  is already up to date.    HCDM (patient stated preference): Doreena, Lory - Brother - [phone]    HCDM, back-up (If primary HCDM is unavailable): Dotzler,Virgina - Sister - [phone]    BPAs completed:  PHQ9 and Diabetes - Foot Exam    COVID-19 Vaccine Summary  Which COVID-19 Vaccine was administered  Pfizer  Type:  Dates Given:  10/23/2022                   If no: Are you interested in scheduling?     Immunization History   Administered Date(s) Administered    COVID-19 VAC,BIVALENT(55YR UP),PFIZER 10/10/2021, 04/11/2022    COVID-19 VACC,MRNA,(PFIZER)(PF) 03/11/2020, 04/01/2020, 09/22/2020    Covid-19 Vac, (74yr+) (Spikevax) Monovalent Xbb.1.5 Moder  10/23/2022    Influenza Vaccine Quad(IM)6 MO-Adult(PF) 09/11/2018, 09/05/2019, 09/29/2020, 10/10/2021, 10/03/2022    Influenza Virus Vaccine, unspecified formulation 03/11/2017, 09/16/2019    PNEUMOCOCCAL POLYSACCHARIDE 23-VALENT 09/29/2020    Pneumococcal Conjugate 13-Valent 05/17/2016    RSV VACCINE, BIVALENT (PF) (ABRYSVO) 11/14/2022    TdaP 09/20/2008       __________________________________________________________________________________________    SCREENINGS COMPLETED IN FLOWSHEETS    HARK Screening       AUDIT       PHQ2       PHQ9  Thoughts that you would be better off dead, or of hurting yourself in some way: Not at all  PHQ-9 TOTAL SCORE: 6    P4 Suicidality Screener                GAD7       COPD Assessment       Falls Risk

## 2023-02-26 NOTE — Unmapped (Unsigned)
[facility] (Faculty Practice) Clinic Visit    Reason for Visit: Follow up ***    A/P:    ***  1. Chronic diastolic heart failure (CMS-HCC)    2. Chronic obstructive pulmonary disease, unspecified COPD type (CMS-HCC)    3. Type 2 diabetes mellitus without complication, without long-term current use of insulin (CMS-HCC)    4. Essential hypertension, benign    5. Closed subcapital fracture of right femur with routine healing, subsequent encounter    6. 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 Abnormal CT of the abdomen    8. Left renal mass    9. Rheumatoid nodule of upper arm (CMS-HCC)      Health maintenence:     No follow-ups on file.    I personally spent *** minutes face-to-face and non-face-to-face in the care of this patient, which includes all pre, intra, and post visit time on the date of service.    __________________________________________________________    HPI:    RA: some good and some bad days. Wants to get rid of her walker.     COPD: down to 88% with exertion and comes back up.   Have been using 1 Liter with exertion, up to 2 liters lately.     HFrEF: no ankle swelling, weight is stable.     methotrexate and humira. Stopped plaquenil.     [facility] for surveillance.       [place] CT abd in 7.2023:   -Slightly more hypodense areas are noted, 1 measuring 1.5 cm within   the LOWER LEFT kidney (series 11: Image 78) and a 1.5 x 2.5 cm   subcapsular area(11:83) which may represent phlegmon/developing   abscess.   -A 2 cm indeterminate lesion extending off of the RIGHT UPPER renal   pole (11:55-59) measures 52 Hounsfield units.   CT chest: showed 1.8 cm intermediate attenuation right upper pole renal lesion     Renal lesion- needs MRI to eval right upper pole lesion of 1.8 cm.   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)    COPD (chronic obstructive pulmonary disease) (CMS-HCC)    Diabetes mellitus, type 2 (CMS-HCC)    Chronic diastolic heart failure (CMS-HCC)    Subcapital fracture of neck of right femur (CMS-HCC)    Lesion of left native kidney       Medications:  Reviewed in EPIC    ROS: remainder of review of systems negative  __________________________________________________________    Physical Exam:   Vital Signs:  Vitals:    02/26/23 1508   BP: 115/62   BP Site: L Arm   BP Position: Sitting   BP Cuff Size: Large   Pulse: 79   Temp: 36.1 ??C (97 ??F)   TempSrc: Temporal   SpO2: 94%   Weight: (!) 109.6 kg (241 lb 9.6 oz)   Height: 167.6 cm (5' 6)      Wt Readings from Last 3 Encounters:   02/26/23 (!) 109.6 kg (241 lb 9.6 oz)   01/03/23 108.6 kg (239 lb 6.4 oz)   11/14/22 (!) 112.5 kg (248 lb)  Body mass index is 39 kg/m??.    Gen: Well appearing, NAD  [place]    CV: RRR, no murmurs  Pulm: CTA bilaterally, no crackles or wheezes  GI: Soft, NTND, normal BS. No HSM.  Ext: No edema

## 2023-03-08 MED ORDER — MAGNESIUM OXIDE 400 MG (241.3 MG MAGNESIUM) TABLET
ORAL_TABLET | Freq: Every day | ORAL | 3 refills | 90 days | Status: CP
Start: 2023-03-08 — End: 2024-03-07

## 2023-03-17 MED ORDER — METHOTREXATE SODIUM 2.5 MG TABLET
ORAL_TABLET | ORAL | 3 refills | 0 days
Start: 2023-03-17 — End: ?

## 2023-03-18 MED ORDER — METHOTREXATE SODIUM 2.5 MG TABLET
ORAL_TABLET | ORAL | 3 refills | 84 days | Status: CP
Start: 2023-03-18 — End: ?

## 2023-03-18 NOTE — Unmapped (Signed)
Methotrexate refill  Last Visit Date: 01/03/2023  Next Visit Date: 05/09/2023    Lab Results   Component Value Date    ALT 20 01/03/2023    AST 27 01/03/2023    ALBUMIN 3.0 (L) 10/03/2022    CREATININE 0.48 (L) 02/26/2023     Lab Results   Component Value Date    WBC 10.4 02/26/2023    HGB 11.0 (L) 02/26/2023    HCT 33.1 (L) 02/26/2023    PLT 394 02/26/2023     Lab Results   Component Value Date    NEUTROPCT 73.4 02/26/2023    LYMPHOPCT 16.5 02/26/2023    MONOPCT 7.6 02/26/2023    EOSPCT 1.8 02/26/2023    BASOPCT 0.7 02/26/2023

## 2023-03-20 NOTE — Unmapped (Signed)
[facility] Refill Coordination Note    Specialty Medication(s) to be Shipped:   Inflammatory Disorders: Humira    Other medication(s) to be shipped: No additional medications requested for fill at this time     Cynthia Ramirez, DOB: 09-Oct-1961  Phone: [phone] (home)       All above HIPAA information was verified with patient.     Was a Nurse, learning disability used for this call? No    Completed refill call assessment today to schedule patient's medication shipment from the [facility] [phone]).  All relevant notes have been reviewed.     Specialty medication(s) and dose(s) confirmed: Regimen is correct and unchanged.   Changes to medications: Addalyn reports no changes at this time.  Changes to insurance: No  New side effects reported not previously addressed with a pharmacist or physician: None reported  Questions for the pharmacist: No    Confirmed patient received a Conservation officer, historic buildings and a Surveyor, mining with first shipment. The patient will receive a drug information handout for each medication shipped and additional FDA Medication Guides as required.       DISEASE/MEDICATION-SPECIFIC INFORMATION        For patients on injectable medications: Patient currently has 1 doses left.  Next injection is scheduled for 03/25/2023.    SPECIALTY MEDICATION ADHERENCE     Medication Adherence    Patient reported X missed doses in the last month: 0  Specialty Medication: HUMIRA(CF) PEN 40 mg/0.4 mL injection (adalimumab)  Patient is on additional specialty medications: No  Patient is on more than two specialty medications: No  Any gaps in refill history greater than 2 weeks in the last 3 months: no  Demonstrates understanding of importance of adherence: yes  Informant: patient  Confirmed plan for next specialty medication refill: delivery by pharmacy  Refills needed for supportive medications: not needed          Refill Coordination    Has the Patients' Contact Information Changed: No  Is the Shipping Address Different: No         Were doses missed due to medication being on hold? No    HUMIRA(CF) PEN 40 /0.4   mg/ml: 1 days of medicine on hand       REFERRAL TO PHARMACIST     Referral to the pharmacist: Not needed      [facility]     Shipping address confirmed in Epic.     Patient was notified of new phone menu : No    Delivery Scheduled: Yes, Expected medication delivery date: 03/27/2023.     Medication will be delivered via Same Day Courier to the prescription address in Epic WAM.    Kerby Less   [facility] Specialty Technician

## 2023-03-22 ENCOUNTER — Ambulatory Visit: Admit: 2023-03-22 | Discharge: 2023-03-23 | Payer: PRIVATE HEALTH INSURANCE

## 2023-03-22 MED ADMIN — gadobenate dimeglumine (MULTIHANCE) 529 mg/mL (0.1mmol/0.2mL) solution 10 mL: 10 mL | INTRAVENOUS | @ 14:00:00 | Stop: 2023-03-22

## 2023-03-27 MED FILL — HUMIRA PEN CITRATE FREE 40 MG/0.4 ML: SUBCUTANEOUS | 28 days supply | Qty: 2 | Fill #3

## 2023-04-16 NOTE — Unmapped (Unsigned)
Pulmonary Clinic - Follow-Up Visit    Referring Physician :  Christen Butter  PCP:     Colford, Jackquline Berlin, MD  Reason for Consult:   Hypoxic respiratory failure    ASSESSMENT and PLAN     Cynthia Ramirez is a 62 y.o. female with obesity, COPD, former tobacco use (1ppd, >30 years, quit in Feb 2023), HFpEF, RA on methotrexate, OSA on BiPAP, knee osteoarthritis whom we are seeing in  for evaluation of hypoxic respiratory failure, now improving.    The patient is overall doing well from a respiratory status. Will continue current regiment of advair and spiriva until she can escalate her physical activity. In the meantime can slowly wean oxygen as tolerated to maintain sats >88%. In the meantime continue current physical therapy for her hip pain (history of hip fracture Aug 2023 and now what sounds like hip arthritis in her other hip). Will try to avoid prednisone given hip fractures.  Thankfully RA appears to be controlled, can see rheumatology note Oct 2023. Agree with continuing lasix as needed for now to maintain dry weight 249lb. Will continue her current BiPAP settings given reassuring report (Feb-March 2023). Lastly in regards to screening colonoscopy, patient will inform me when this is scheduled. Will plan to do pre-op evaluation at that time (at least moderate risk given COPD, chronic oxygen, and physical limitation). We will see the patient one more time after she starts Humira. After that anticipate she can be followed by her PCP.     Brief recommendations  - RSV vaccine today  - next visit  - lung cancer screening - due Feb 2024  - immunosuppression per rheumatology    Plan of care was discussed with the patient who acknowledged understanding and is in agreement.  Patient will return to clinic in 6 months or sooner if needed.  This patient was seen and discussed with attending physician, Dr. Johnnette Barrios who agrees with the assessment and plan above.     Rosary Lively, MD  Pulmonary and Critical Care Fellow  Personal Pager: [phone] - 24-hr consult pager: [phone]      HISTORY:     History of Present Illness:  Cynthia Ramirez is a 62 y.o. female with a history of obesity, COPD, tobacco use (1ppd, >30 years, quit in Feb 2023), OSA, chronic hypoxic respiratory failure on 3L [place] with exertion, HFpEF, RA on methotrexate/HCQ/sulfasalazine/pred, knee osteoarthritis whom we are following for COPD and OSA.     Interval History  - last seen in clinic Nov 2023 noted improvement in symptoms and oxygen requirement   - recommend continue advair + Spiriva   - working on PT, planning on at a future date (after broken hip)  - last rheum visit Jan 2024 with Dr. Harless Nakayama for seropositive RA   - weekly methotrexate 20mg  + restart Humira weekly   - tapered off prednisone now ***  - Feb 2024 lung cancer screening CT chest interpretation below  -Continue annual screening with low dose CT chest in 12 months.  -Consider correlation with echocardiography for evidence of pulmonary hypertension.  -MRI of the abdomen for right upper pole renal lesion. Completed May 2024 reassuring  -- Recommend primary care physician evaluation for assessment for coronary calcifications. On statin, no further w/u ordered  -- Recommend mammography for better characterization of asymmetrically prominent left breast soft tissue. Chronic per patient  - July 2023 TTE shows mildly dilated RV, similar to Feb 2023 will need repeat and consideration of  pulmonary hypertension referral   - previously taking lasix as needed for dyspnea, weight gain  - today   - work with PT, consideration of vs pulm rehab   - repeat TTE, pulm hypertension eval   - joint sympoms today   - advair compliance, and spacer   - last prednisone dose   - oxygen use   - A1AT   - PFT today        #COPD, FEV1 ***   - last PFT - 08/07/22 moderate obstruction improved vs march 2023. No BD response. DLCO reduced  02/21/22 moderate obstruction, moderately reduced DLCO  - last exacerbation - hospitalized 01/20/22-02/07/22 hypoxic respiratory failure 2/2 viral pneumonia and RV failure   - received diuresis, levaquin, and nebs   - no steroids because not c/w COPD exacerbation initially  - current regiment -  advair BID + spiriva + albuterol PRN - has never used albuterol inhaler   - adherence is - good   - technique is -  good  - labs  - A1AT - negative  - eos - 200 (highest 300 historically)  - IgE - 105  - ANCA - defer for now, low suspicion  - imaging: Feb 2024 stable repeat Feb 2025  - smoking: 30 + pack years, stopped since discharge in Feb 2023  - pulmonary rehab - now on physical therapy after hip surgery, was previously in pulmonary rehab at [facility] but stopped due to hip injury, doing it twice a week  - lung cancer screening - needs f/u in Feb 2025  - vaccination (pneumococcal, flu, COVID) will plan to repeat Fall 2024, otherwise up to date    #HFpEF  #Concern for pulmonary hypertension/RV dysfunction  - 01/22/2022 TTE normal LV function, diastolic function cannot be accurately assessed, mildly dilated RV with normal function, IVC c/w elevated RA pressure  - 01/20/2022 CTA chest no PE, PA enlargement and right heart enlargement   - 01/31/22 CXR Stable pulmonary vascular congestion with scattered linear atelectasis  - during March 2023 clinic visit noted volume overload and started short term diuresis, this has been continued by her PCP now taking lasix PRN when weight is >233lb  - follow-up echo 06/2022 - looks resassuring, will repeat next year    #GERD symptoms  - no issues on PPI (prilosec)    #OSA symptoms currently on BiPAP 12/5 with 4L bleed in  - discharged with BiPAP from hospitalization  - cannot do outpatient sleep study because of fall risk  - Feb-March 2023 BiPAP compliance report looks good  - Bipap report from Feb-March 2023 shows good compliance (see media tab) and sats are >95% (usually 98%) will try to repeat overnight pulse ox later on RA after weaning daytime oxygen    #Sinus symptoms  - well controlled on saline rinse    #Health Maintenance -  needs RSV vaccine - offer today  Immunization History   Administered Date(s) Administered    COVID-19 VAC,BIVALENT(44YR UP),PFIZER 10/10/2021, 04/11/2022    COVID-19 VACC,MRNA,(PFIZER)(PF) 03/11/2020, 04/01/2020, 09/22/2020    Covid-19 Vac, (68yr+) (Spikevax) Monovalent Xbb.1.5 Moder  10/23/2022    Influenza Vaccine Quad(IM)6 MO-Adult(PF) 09/11/2018, 09/05/2019, 09/29/2020, 10/10/2021, 10/03/2022    Influenza Virus Vaccine, unspecified formulation 03/11/2017, 09/16/2019    PNEUMOCOCCAL POLYSACCHARIDE 23-VALENT 09/29/2020    Pneumococcal Conjugate 13-Valent 05/17/2016    RSV VACCINE, BIVALENT (PF) (ABRYSVO) 11/14/2022    TdaP 09/20/2008     Past Medical History: reviewed, relevant noted above  Past Surgical History: reviewed  Family  History: reviewed  Social History:  - smoking - former tobacco use dis  - drinking - none  - drugs - none  - exposures - none  - employment - third Social research officer, government at [employer],     Past Medical History:   Diagnosis Date    Abnormal mammogram     At risk for falls     Cognitive impairment     has a hard time remembering    COPD (chronic obstructive pulmonary disease) (CMS-HCC)     COPD (chronic obstructive pulmonary disease) (CMS-HCC) 03/06/2018    Diabetes mellitus (CMS-HCC)     Dizziness     Essential hypertension, benign     Family history of diabetes mellitus (DM)     Financial difficulties     Hearing impairment     has a hole in her eardrum    HL (hearing loss)     Impaired mobility     Lack of access to transportation     Nonadherence to medication     Rheumatoid arthritis (CMS-HCC) 2017    Marked elevation of rheumatoid factor 130.5  and CCP>250    Tobacco use disorder     Visual impairment     hard to see per patient     Past Surgical History:   Procedure Laterality Date    BREAST BIOPSY Left 02/2016    HYSTERECTOMY      TAH before age 71    INNER EAR SURGERY Bilateral     2004 per patient     Past Surgical History:   Procedure Laterality Date    BREAST BIOPSY Left 02/2016    HYSTERECTOMY      TAH before age 34    INNER EAR SURGERY Bilateral     2004 and earlier -- unspecified nature of surgery    PILONIDAL CYST DRAINAGE      PR COLONOSCOPY FLX DX W/COLLJ SPEC WHEN PFRMD N/A 03/13/2016    Procedure: COLONOSCOPY, FLEXIBLE, PROXIMAL TO SPLENIC FLEXURE; DIAGNOSTIC, W/WO COLLECTION SPECIMEN BY BRUSH OR WASH;  Surgeon: Liane Comber, MD;  Location: [facility] [place];  Service: Gastroenterology    PR FEMORAL FX, OPEN TX Right 07/19/2022    Procedure: HEMI OPEN TREATMENT OF FEMORAL FRACTURE, PROXIMAL END, NECK, INTERNAL FIXATION OR PROSTHETIC REPLACEMENT - modifier 22;  Surgeon: Valda Favia, MD;  Location: MAIN [facility];  Service: Orthopedics    right hip surgery Right 07/19/2022    TONSILLECTOMY Bilateral 1973 approx     Family History   Problem Relation Age of Onset    Cancer Mother     Colon cancer Mother     Heart disease Father     Heart disease Sister     Diabetes Brother     Diabetes Brother     Diabetes Brother     Breast cancer Neg Hx      Social History     Socioeconomic History    Marital status: Domestic Partner   Tobacco Use    Smoking status: Former     Current packs/day: 0.00     Average packs/day: 1 pack/day for 40.0 years (40.0 ttl pk-yrs)     Types: Cigarettes     Start date: 01/19/1982     Quit date: 01/19/2022     Years since quitting: 1.2     Passive exposure: Past    Smokeless tobacco: Never    Tobacco comments:     Pt stated stopped since 01/21/22  Vaping Use    Vaping status: Never Used   Substance and Sexual Activity    Alcohol use: Yes     Alcohol/week: 1.0 standard drink of alcohol     Types: 1 Cans of beer per week     Comment: Occasionally    Drug use: No   Social History Narrative    Family, social, cultural characteristics: social support includes partner .      Patient has the following communication needs: none disclosed     Health Literacy: How Health: none, per patient    Family history of mental health illness and/or substance abuse: asked patient/parent and none disclosed.    Have you been seen by any medical provider that we have not referred you to since your last visit ? Yes       Discussed a Living Will with the patient and the : Patient is under the age of 60.      Social Determinants of Health     Financial Resource Strain: High Risk (11/07/2022)    Overall Financial Resource Strain (CARDIA)     Difficulty of Paying Living Expenses: Hard   Food Insecurity: Food Insecurity Present (11/07/2022)    Hunger Vital Sign     Worried About Running Out of Food in the Last Year: Sometimes true     Ran Out of Food in the Last Year: Sometimes true   Transportation Needs: Unmet Transportation Needs (11/07/2022)    PRAPARE - Transportation     Lack of Transportation (Medical): Yes     Lack of Transportation (Non-Medical): Yes   Physical Activity: Inactive (11/07/2022)    Exercise Vital Sign     Days of Exercise per Week: 0 days     Minutes of Exercise per Session: 0 min   Stress: Stress Concern Present (11/07/2022)    [employer] of Occupational Health - Occupational Stress Questionnaire     Feeling of Stress : Very much   Social Connections: Socially Isolated (11/07/2022)    Social Connection and Isolation Panel [NHANES]     Frequency of Communication with Friends and Family: Never     Frequency of Social Gatherings with Friends and Family: Never     Attends Religious Services: Never     Database administrator or Organizations: No     Attends Engineer, structural: Never     Marital Status: Living with partner       Home Medications: reviewed  Current Outpatient Medications on File Prior to Visit   Medication Sig Dispense Refill    acetaminophen (TYLENOL) 325 MG tablet Take 2 tablets (650 mg total) by mouth two (2) times a day as needed.      ADALIMUMAB PEN CITRATE FREE 40 MG/0.4 ML Inject the contents of 1 pen (40 mg total) under the skin every fourteen (14) days. 6 each 3    albuterol HFA 90 mcg/actuation inhaler Inhale 2 puffs every six (6) hours as needed for wheezing. 8 g 11    ascorbic acid (VITAMIN C ORAL) Take 1 tablet by mouth daily.      atorvastatin (LIPITOR) 40 MG tablet Take 1 tablet (40 mg total) by mouth daily. 90 tablet 3    cholecalciferol, vitamin D3-50 mcg, 2,000 unit,, 50 mcg (2,000 unit) tablet Take 1 tablet (50 mcg total) by mouth daily. 2 tablets daily      fluticasone propion-salmeteroL (ADVAIR HFA) 230-21 mcg/actuation inhaler Inhale 2 puffs two (2) times a day. (Patient taking differently: Inhale  2 puffs  in the morning.) 36 g 4    folic acid (FOLVITE) 1 MG tablet TAKE 1 TABLET BY MOUTH EVERY DAY 90 tablet 3    furosemide (LASIX) 40 MG tablet Take 1 tablet (40 mg total) by mouth daily. Take one tablet daily as needed for weight gain (goal weight ~248-251lbs) (Patient taking differently: Take 0.5 tablets (20 mg total) by mouth daily. Take one tablet daily as needed for edema. Do not administer if SBP<100.)  0    magnesium oxide (MAG-OX) 400 mg (241.3 mg elemental magnesium) tablet Take 1 tablet (400 mg total) by mouth daily. 90 tablet 3    meloxicam (MOBIC) 15 MG tablet Take 1 tablet (15 mg total) by mouth daily. (Patient not taking: Reported on 01/03/2023) 30 tablet 3    methotrexate 2.5 MG tablet TAKE 8 TABLETS (20 MG TOTAL) BY MOUTH ONCE A WEEK. 96 tablet 3    multivitamin (TAB-A-VITE/THERAGRAN) per tablet Take 1 tablet by mouth daily.      omeprazole (PRILOSEC) 20 MG capsule TAKE 1 CAPSULE BY MOUTH EVERY DAY IN THE MORNING 90 capsule 4    potassium chloride 20 MEQ ER tablet Take 1 tablet (20 mEq total) by mouth daily. (Patient not taking: Reported on 01/03/2023) 90 tablet 3    sodium chloride (OCEAN) 0.65 % nasal spray 4 drops (1 spray total) as needed for congestion. (Patient not taking: Reported on 01/03/2023)      tiotropium bromide (SPIRIVA RESPIMAT) 2.5 mcg/actuation inhalation mist Inhale 2 puffs daily. 16 g 11 for congestion. (Patient not taking: Reported on 01/03/2023)      tiotropium bromide (SPIRIVA RESPIMAT) 2.5 mcg/actuation inhalation mist Inhale 2 puffs daily. 16 g 11    triamcinolone (KENALOG) 0.1 % ointment Apply topically Two (2) times a day. 15 g 0    [DISCONTINUED] umeclidinium-vilanteroL (ANORO ELLIPTA) 62.5-25 mcg/actuation inhaler Inhale 1 puff BY MOUTH daily. For benefit inquiry only. Please send Epic chat message to Sejal K with coverage information, then discontinue Rx. 60 each 0     No current facility-administered medications on file prior to visit.     Allergies: reviewed    Review of Systems:  A comprehensive review of systems was completed and negative except as noted in HPI.    PHYSICAL EXAM:     There were no vitals filed for this visit.    General: Alert and oriented, no acute distress  HEENT: MMM, clear oropharynx  CV: RRR, no m/r/g  Lungs: prolonged expiration and mild wheezing, some use of accessory muscles, improved from previous exam  Abd: Soft, NT, ND, no rebound or guarding  Ext: Warm, well perfused, 1+ pitting edema LE bilaterally (improved from previous exam)  Skin: No rashes  Neuro: No focal deficits    LABORATORY and RADIOLOGY DATA:     Pulmonary Function Tests/Interpretation:   04/2023 The expiratory flow volume curve shows mild obstructive ventilatory defect.DLCO is moderately reduced.   Improved since prior test on 08/07/2022    08/07/22 moderate obstruction improved vs march 2023. No BD response. DLCO reduced    02/21/22  Spirometry:  Spirometry shows moderate obstructive impairment  Flow Volume:  Flow-volume loop is normal  Lung volume: pending  DLCO:  DLCO is moderately reduced  : unable to do today    Pertinent Laboratory Data:  HIV: No results found for: HIV  Autoimmune:  Lab Results   Component Value Date/Time    RF 105.1 (H) 06/30/2019 09:06 AM    CCPAB 52.0 (H)  06/30/2019 09:06 AM    CCPIGG Positive (A) 06/30/2019 09:06 AM     ANCA: No results found for: ANCA, IFA, 02/28/2012 05:10 PM    NEUTROABS 7.6 02/26/2023 04:14 PM    NEUTROABS 4.6 02/28/2012 05:10 PM    LYMPHSABS 1.7 02/26/2023 04:14 PM    LYMPHSABS 2.4 02/28/2012 05:10 PM    EOSABS 0.2 02/26/2023 04:14 PM    EOSABS 0.2 02/28/2012 05:10 PM    BASOSABS 0.1 02/26/2023 04:14 PM    BASOSABS 0.1 02/28/2012 05:10 PM     IgE: No results found for: TOTALIGE  Alpha-1 level: offer    Pertinent Imaging Data:  01/31/22 CXR  Stable pulmonary vascular congestion with scattered linear atelectasis.    Feb 2024 CT chest (lung cancer screening)  -Continue annual screening with low dose CT chest in 12 months.  -Consider correlation with echocardiography for evidence of pulmonary hypertension.  -Recommend MRI of the abdomen with and without contrast for further evaluation of right upper pole renal lesion.  -- Recommend primary care physician evaluation for assessment for coronary calcifications.  -- Recommend mammography for better characterization of asymmetrically prominent left breast soft tissue.

## 2023-04-16 NOTE — Unmapped (Signed)
[facility] Refill Coordination Note    Specialty Medication(s) to be Shipped:   Inflammatory Disorders: Humira    Other medication(s) to be shipped: No additional medications requested for fill at this time     EVVA GARMS, DOB: 07/29/1961  Phone: [phone] (home)       All above HIPAA information was verified with patient.     Was a Nurse, learning disability used for this call? No    Completed refill call assessment today to schedule patient's medication shipment from the [facility] [phone]).  All relevant notes have been reviewed.     Specialty medication(s) and dose(s) confirmed: Regimen is correct and unchanged.   Changes to medications: Brylee reports no changes at this time.  Changes to insurance: No  New side effects reported not previously addressed with a pharmacist or physician: None reported  Questions for the pharmacist: No    Confirmed patient received a Conservation officer, historic buildings and a Surveyor, mining with first shipment. The patient will receive a drug information handout for each medication shipped and additional FDA Medication Guides as required.       DISEASE/MEDICATION-SPECIFIC INFORMATION        For patients on injectable medications: Patient currently has 1 doses left.  Next injection is scheduled for 5/6.    SPECIALTY MEDICATION ADHERENCE     Medication Adherence    Patient reported X missed doses in the last month: 0  Specialty Medication: HUMIRA(CF) PEN 40 mg/0.4 mL  Patient is on additional specialty medications: No              Were doses missed due to medication being on hold? No    Humira 40/0.4 mg/ml: 6 days of medicine on hand        REFERRAL TO PHARMACIST     Referral to the pharmacist: Not needed      [facility]     Shipping address confirmed in Epic.       Delivery Scheduled: Yes, Expected medication delivery date: 04/18/23.     Medication will be delivered via Same Day Courier to the prescription address in Epic WAM.    Willette Pa   [facility] Specialty Technician

## 2023-04-17 ENCOUNTER — Ambulatory Visit: Admit: 2023-04-17 | Discharge: 2023-04-18 | Payer: PRIVATE HEALTH INSURANCE

## 2023-04-17 DIAGNOSIS — G4733 Obstructive sleep apnea (adult) (pediatric): Principal | ICD-10-CM

## 2023-04-17 DIAGNOSIS — I272 Pulmonary hypertension, unspecified: Principal | ICD-10-CM

## 2023-04-17 DIAGNOSIS — J449 Chronic obstructive pulmonary disease, unspecified: Principal | ICD-10-CM

## 2023-04-17 NOTE — Unmapped (Addendum)
Sleep Study Scheduling: Please call [phone] to schedule your sleep study    [facility] Medical Equipment: [phone]    Inhaler teaching:  [email or web address] - link at the bottom includes inhaler voucher  https://www.copdfoundation.org/Learn-More/Educational-Materials-Resources/Educational-Video-Series.aspx - inhaler teaching  AutomobileRetailer.it - inhaler teaching        ---------------------------------------------------------------------------------------------------------------------------------------------------------------------------------------------------------------------------------------------------    Please take your medications as prescribed.  Thank you for allowing me to be a part of your care.  Please call the clinic with any questions.    Rosary Lively, MD  Pulmonary and Critical Care Medicine  [address]  [id number]  [place], [place] [address]    Between appointments, you can reach [place] at these numbers:    For appointments or the Pulmonary Nurse: [phone]  Fax: [phone]    For urgent issues after hours:  Hospital Operator: [phone], ask for Pulmonary Fellow on call    Some general information about contacting us:  - We will do our best to respond as quickly as possible, as your message is important to [place]. However, many of our providers have other duties (inpatient hospital work, Producer, television/film/video activities, teaching) that may make them unavailable for same day responses.   - For phone calls or MyChart messages, it may take 2-3 business days for you to hear back from our clinic. If your doctor is working in the hospital when a message is sent, a response from your doctor may be delayed. We apologize in advance for any delays.  - MyChart should not be used for issues that require same day response.  - If you have an urgent issue that you feel needs a response the same day, you should also contact your primary care provider or be evaluated at an Urgent Care clinic.  - If you have sent a MyChart message to the clinic and have not received a response after 2-3 business days, please call us at [phone] to speak to a nurse.

## 2023-04-18 DIAGNOSIS — Z1231 Encounter for screening mammogram for malignant neoplasm of breast: Principal | ICD-10-CM

## 2023-04-18 MED FILL — HUMIRA PEN CITRATE FREE 40 MG/0.4 ML: SUBCUTANEOUS | 28 days supply | Qty: 2 | Fill #4

## 2023-04-23 ENCOUNTER — Ambulatory Visit: Admit: 2023-04-23 | Discharge: 2023-04-24 | Payer: PRIVATE HEALTH INSURANCE

## 2023-05-07 ENCOUNTER — Ambulatory Visit: Admit: 2023-05-07 | Discharge: 2023-05-08 | Payer: PRIVATE HEALTH INSURANCE

## 2023-05-07 DIAGNOSIS — D492 Neoplasm of unspecified behavior of bone, soft tissue, and skin: Principal | ICD-10-CM

## 2023-05-07 NOTE — Unmapped (Signed)
It was nice to see you today! Your resident physician was Dr. Burnett Spray    If any of your medications are too expensive, look for a coupon at GoodRx.com or reference the application on a smartphone  - Enter the medication name, size, and your zip code to find coupons for local pharmacies.   - Print a coupon and bring it to the pharmacy, or pull up the coupon on a smartphone.  - You can also call pharmacies to ask about the cost of your medication before you pick it up.  If you still cannot afford your medication, please let us know.     Please call our clinic at [phone] with any concerns or to schedule a follow up appointment. We look forward to seeing you again!    [facility] releases most results to you as soon as they are available. Therefore, you may see some results before we do. Please give us 2 business days to review the tests and contact you by phone or through MyChart. If you are concerned that some results may be upsetting or confusing, you may wish to wait until we contact you before looking at the report in MyChart. If you have an urgent question, you can send us a message or call our clinic. Otherwise, we prefer that you wait 2 business days for us to contact you.

## 2023-05-07 NOTE — Unmapped (Signed)
Dermatology Note     Assessment and Plan:      Subcutaneous mobile nodules on the right elbow and left cheek  - Reassurance provided regarding the benign appearance of lesions noted on exam today given they are mobile, well-demarcated, small, and non-tender.  - Patient wants these removed. It is reasonable to remove lesions since they are growing and we do not have a definitive diagnosis.  - Reviewed differences between doing surgery here under local anesthesia vs with general or plastic surgery in the OR. Patient prefers being treated in the outpatient setting. She would like ice packs and cold spray to help her get through numbing.  - Discussed risk of scarring, infection, bleeding, and recurrence.   - Recommend treating the elbow first to see how she heals and to see the diagnosis. If all goes well, can discuss excision of lesion on the face. Discussed that CPAP will rub on surgical site.  - Discussed that wound healing may be worse in the setting of elevated HgbA1c.  - I messaged surgical schedulers.    The patient was advised to call for an appointment should any new, changing, or symptomatic lesions develop.     RTC: Return for surgery. or sooner as needed   _________________________________________________________________      Chief Complaint     Chief Complaint   Patient presents with    Lesion Of Concern     Pt here foe areas on face and Rt elbow, no h/o Skin cancers       HPI     Cynthia Ramirez is a 62 y.o. female who presents as a patient who is seen in consultation by Abbie Sons, MD at the request of Cristin Mulderig Colfor* to Dermatology for multiple lesions of concern. These are 2 mobile nodules that came up 2 months ago on the R elbow and L cheek. They are growing.    Patient uses 1L oxygen during the day and sleeps with a CPAP. She stopped smoking.    She has a history of cystic acne. Denies cysts in axillae, groin, buttocks, and pannus.    Of note, patient has RA and is on methotrexate and humira.  She has T2DM with HgbA1c of 6.2. She is not on medications for diabetes.    The patient denies any other new or changing lesions or areas of concern.     Pertinent Past Medical History     No history of skin cancer  T2DM  CHF  COPD  RA  Tobaccos use     Family History:   Negative for melanoma    Past Medical History, Family History, Social History, Medication List, Allergies, and Problem List were reviewed in the rooming section of Epic.     ROS: Other than symptoms mentioned in the HPI, no fevers, chills, or other skin complaints    Physical Examination     GENERAL: Well-appearing female in no acute distress, resting comfortably.  NEURO: Alert and oriented, answers questions appropriately  PSYCH: Normal mood and affect  RESP: No increased work of breathing  SKIN (Focal Skin Exam): Per patient request, examination of face and right elbow was performed  - a background of atrophic ice pick scarring  - well demarcated mobile nodules on the L cheek ~1.3 cm and the R elbow ~1.5 cm        All areas not commented on are within normal limits or unremarkable      (Approved Template 08/29/2020)

## 2023-05-09 ENCOUNTER — Ambulatory Visit
Admit: 2023-05-09 | Discharge: 2023-05-10 | Payer: PRIVATE HEALTH INSURANCE | Attending: Internal Medicine | Primary: Internal Medicine

## 2023-05-09 DIAGNOSIS — M0579 Rheumatoid arthritis with rheumatoid factor of multiple sites without organ or systems involvement: Principal | ICD-10-CM

## 2023-05-09 DIAGNOSIS — M1711 Unilateral primary osteoarthritis, right knee: Principal | ICD-10-CM

## 2023-05-09 DIAGNOSIS — M21079 Valgus deformity, not elsewhere classified, unspecified ankle: Principal | ICD-10-CM

## 2023-05-09 LAB — CBC W/ AUTO DIFF
BASOPHILS ABSOLUTE COUNT: 0.1 10*9/L (ref 0.0–0.1)
BASOPHILS RELATIVE PERCENT: 0.5 %
EOSINOPHILS ABSOLUTE COUNT: 0.2 10*9/L (ref 0.0–0.5)
EOSINOPHILS RELATIVE PERCENT: 1.5 %
HEMATOCRIT: 35 % (ref 34.0–44.0)
HEMOGLOBIN: 11.3 g/dL (ref 11.3–14.9)
LYMPHOCYTES ABSOLUTE COUNT: 1.7 10*9/L (ref 1.1–3.6)
LYMPHOCYTES RELATIVE PERCENT: 16.2 %
MEAN CORPUSCULAR HEMOGLOBIN CONC: 32.2 g/dL (ref 32.0–36.0)
MEAN CORPUSCULAR HEMOGLOBIN: 29.3 pg (ref 25.9–32.4)
MEAN CORPUSCULAR VOLUME: 90.9 fL (ref 77.6–95.7)
MEAN PLATELET VOLUME: 8.2 fL (ref 6.8–10.7)
MONOCYTES ABSOLUTE COUNT: 0.8 10*9/L (ref 0.3–0.8)
MONOCYTES RELATIVE PERCENT: 8 %
NEUTROPHILS ABSOLUTE COUNT: 7.6 10*9/L (ref 1.8–7.8)
NEUTROPHILS RELATIVE PERCENT: 73.8 %
PLATELET COUNT: 347 10*9/L (ref 150–450)
RED BLOOD CELL COUNT: 3.85 10*12/L — ABNORMAL LOW (ref 3.95–5.13)
RED CELL DISTRIBUTION WIDTH: 17.2 % — ABNORMAL HIGH (ref 12.2–15.2)
WBC ADJUSTED: 10.3 10*9/L (ref 3.6–11.2)

## 2023-05-09 LAB — COMPREHENSIVE METABOLIC PANEL
ALBUMIN: 3.1 g/dL — ABNORMAL LOW (ref 3.4–5.0)
ALKALINE PHOSPHATASE: 239 U/L — ABNORMAL HIGH (ref 46–116)
ALT (SGPT): 22 U/L (ref 10–49)
ANION GAP: 5 mmol/L (ref 5–14)
AST (SGOT): 28 U/L (ref <=34)
BILIRUBIN TOTAL: 0.6 mg/dL (ref 0.3–1.2)
BLOOD UREA NITROGEN: 20 mg/dL (ref 9–23)
BUN / CREAT RATIO: 43
CALCIUM: 9.1 mg/dL (ref 8.7–10.4)
CHLORIDE: 106 mmol/L (ref 98–107)
CO2: 27.6 mmol/L (ref 20.0–31.0)
CREATININE: 0.47 mg/dL — ABNORMAL LOW
EGFR CKD-EPI (2021) FEMALE: >90 mL/min/{1.73_m2} (ref >=60)
GLUCOSE RANDOM: 133 mg/dL (ref 70–179)
POTASSIUM: 4.1 mmol/L (ref 3.4–4.8)
PROTEIN TOTAL: 7.4 g/dL (ref 5.7–8.2)
SODIUM: 139 mmol/L (ref 135–145)

## 2023-05-09 NOTE — Unmapped (Signed)
RHEUMATOLOGY OUTPATIENT FOLLOWUP VISIT    Assessment/Plan:  Cynthia Ramirez is a 62 y.o. 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 Taking methotrexate and Humira. We discussed that her current joint exam is more c/w OA. Will continue with the current RA medications and check monitoring labs. Will refer to PT for right knee OA and Dr. Michaell Cowing for b/l ankle valgus deformity.     Seropositive rheumatoid arthritis  - continue methotrexate  20 mg once per week  - continue folic acid 1 mg daily  - Update monitoring labs today: CBCw/diff, CMP  - Continue Humira (or biosilimar) 40 mg every other week.    2. Right knee OA  - referral to [facility] in [place]    3. Valgus deformity b/l ankle  Referral to Dr. Michaell Cowing in [place]    RTC in 4-5 months overbook    Immunization History   Administered Date(s) Administered Comments    COVID-19 VAC,BIVALENT(73YR UP),PFIZER 10/10/2021      04/11/2022     COVID-19 VACC,MRNA,(PFIZER)(PF) 03/11/2020 Per [facility]     04/01/2020 Per [facility]     09/22/2020     Covid-19 Vac, (69yr+) (Spikevax) Monovalent Xbb.1.5 Moder  10/23/2022     Influenza Vaccine Quad(IM)6 MO-Adult(PF) 09/11/2018      09/05/2019      09/29/2020      10/10/2021      10/03/2022     Influenza Virus Vaccine, unspecified formulation 03/11/2017      09/16/2019 [facility]    PNEUMOCOCCAL POLYSACCHARIDE 23-VALENT 09/29/2020     Pneumococcal Conjugate 13-Valent 05/17/2016     RSV VACCINE, BIVALENT (PF) (ABRYSVO) 11/14/2022     TdaP 09/20/2008 [facility]       HPI: Cynthia Ramirez is a 62 y.o.  female whom I met via video in March 2021 for a new patient encounter for the evaluation of erosive rheumatoid arthritis RF 130, CCP >250 . Previously followed at [facility] and last seen there in 2018. Previous treatment with methotrexate though she admits not taking it very often. She was not interested in biologics but they were discussed. She does not like to take medications. Her symptoms started after she took a bad fall on the job in 2016. She jammed her left hand. No fractures. She spent a year seeing physicians about the knee injury and eventually had surgery for a torn meniscus. She limped around for a year. She developed sciatica and worked with a Land which helped. She has the sciatic issue again which she thinks may be from coughing from her COPD.The top of her foot has a bony protuberance. She went to a podiatrist who gave her inserts which did not help. Her left 3rd PIP joint remains swollen. I checked xrays of her hands and feet and she has no erosive disease. She is wary of taking medications and given the fact that she has no erosions, we decided at last visit to try HCQ 400 mg daily. CRP was 36, ESR normal.  Tried arava but it did not help.  She was admitted in February for volume overload. Acute on chronic hypoxic respiratory failure 2/2 probable viral pneumonia and HFpEF. During her hospitalization she spent all of her time in the ICU in bed. During that time her joint pain got extremely bad. Now involves her shoulders, knees in addition to wrists and ankles. She developed acute left knee pain about 10 days ago. No trauma. She tried elevating her legs, used compression socks. It is better  now. Pulmonary rehab has been prescribed but she cannot walk. She is taking aleve alternating with tylenol. Aleve 2 every 12 hours. Having a very difficult time getting up out of a chair due to both weakness and pain.   Xray of the left knee shows Tricompartmental joint space narrowing osteophyte formation most proximal in the medial and lateral compartments. Moderate size joint effusion without layering fat fluid. Soft tissues are within normal limits.      Interim History: Last seen in January.she is frustrated with her joint pain. Having a difficult time walking.    Right knee: reviewed Xray. This shows tricompartmental OA. Requested referral to [facility] PT in [place].  B/l ankle pain: on exam she has valgus deformity of both ankles. Arches look like they are collapsing.  Her gait is very stiff. Has difficulty flexing at the knees and ankles.  Wants to stop oxygen. Getting close. Using 1 liter.  Has been referred to Pulmonary rehab but never got a chance to start.  Having no difficulty with methotrexate or humira.  Having more bony changes in the right 3rd PIP and left 2nf PIP. Can not bend these fingers.     MRI of the abdomen:   1.1 cm indeterminate left renal lesion, as above, incompletely characterized on single phase CT and new compared with 2018. Recommend short interval follow-up with MRI of the abdomen with and without contrast to evaluate for interval stability and characterization.      Subacute/chronic splenic infarct, new compared with 02/11/2017.      Hepatic lesions consistent with cysts/biliary versus biliary hamartomas, not substantially changed compared with 2018.     Allergies:  Doxycycline hcl, Shellfish containing products, and Sulfa (sulfonamide antibiotics)    Problem List  Patient Active Problem List   Diagnosis    Tobacco use disorder    Family history of diabetes mellitus (DM)    Lumbar radiculopathy    Rheumatoid arthritis involving multiple sites with positive rheumatoid factor (CMS-HCC)    COPD (chronic obstructive pulmonary disease) (CMS-HCC)    Diabetes mellitus, type 2 (CMS-HCC)    Chronic diastolic heart failure (CMS-HCC)    Subcapital fracture of neck of right femur (CMS-HCC)    Lesion of left native kidney      Medical History:  Past Medical History:   Diagnosis Date    Abnormal mammogram     At risk for falls     Cognitive impairment     has a hard time remembering    COPD (chronic obstructive pulmonary disease) (CMS-HCC)     COPD (chronic obstructive pulmonary disease) (CMS-HCC) 03/06/2018    Diabetes mellitus (CMS-HCC)     Dizziness     Essential hypertension, benign     Family history of diabetes mellitus (DM)     Financial difficulties     Hearing impairment     has a hole in her eardrum    HL (hearing loss) Impaired mobility     Lack of access to transportation     Nonadherence to medication     Rheumatoid arthritis (CMS-HCC) 2017    Marked elevation of rheumatoid factor 130.5  and CCP>250    Tobacco use disorder     Visual impairment     hard to see per patient     Surgical History:  Past Surgical History:   Procedure Laterality Date    BREAST BIOPSY Left 02/2016    HYSTERECTOMY      TAH before age 39  INNER EAR SURGERY Bilateral     2004 and earlier -- unspecified nature of surgery    PILONIDAL CYST DRAINAGE      PR COLONOSCOPY FLX DX W/COLLJ SPEC WHEN PFRMD N/A 03/13/2016    Procedure: COLONOSCOPY, FLEXIBLE, PROXIMAL TO SPLENIC FLEXURE; DIAGNOSTIC, W/WO COLLECTION SPECIMEN BY BRUSH OR WASH;  Surgeon: Liane Comber, MD;  Location: [facility];  Service: Gastroenterology    PR FEMORAL FX, OPEN TX Right 07/19/2022    Procedure: HEMI OPEN TREATMENT OF FEMORAL FRACTURE, PROXIMAL END, NECK, INTERNAL FIXATION OR PROSTHETIC REPLACEMENT - modifier 22;  Surgeon: Valda Favia, MD;  Location: MAIN OR [facility];  Service: Orthopedics    right hip surgery Right 07/19/2022    TONSILLECTOMY Bilateral 1973 approx     Social History:  Social History     Tobacco Use    Smoking status: Former     Current packs/day: 0.00     Average packs/day: 1 pack/day for 40.0 years (40.0 ttl pk-yrs)     Types: Cigarettes     Start date: 01/19/1982     Quit date: 01/19/2022     Years since quitting: 1.3     Passive exposure: Past    Smokeless tobacco: Never    Tobacco comments:     Pt stated stopped since 01/21/22   Vaping Use    Vaping status: Never Used   Substance Use Topics    Alcohol use: Yes     Alcohol/week: 1.0 standard drink of alcohol     Types: 1 Cans of beer per week     Comment: Occasionally    Drug use: No   she works at [employer] in [place]     Family History:  Family History   Problem Relation Age of Onset    Cancer Mother     Colon cancer Mother     Heart disease Father     Heart disease Sister     Diabetes Brother Diabetes Brother     Diabetes Brother     Breast cancer Neg Hx     Melanoma Neg Hx     Basal cell carcinoma Neg Hx     Squamous cell carcinoma Neg Hx      Review of Systems: Please see above in the HPI, the remainder of a 10-system review was unremarkable.    Objective   Vitals:    05/09/23 1103   BP: 143/77   Pulse: 74   Temp: 37.1 ??C (98.8 ??F)       Physical Exam  General:   In no distress, She continues to wear oxygen by nasal cannula.   Eyes:   conjunctivae are clear.   ENT:   MMM.    Skin:    No diffuse rash.   Musculo Skeletal:   Minimal Tenderness to b/l 3rd MCP with swelling.   Right 3rd PIP and left 2nd PIP poor movement, bony changes  Tenderness to L wrist. Good ROM to b/l wrists.   Flexion contracture to L elbow. Good ROM to R.   Good ROM of b/l knees w/o swelling.   Valgus deformity of both ankles, fallen arches b/l

## 2023-05-09 NOTE — Unmapped (Addendum)
PT referral faxed to [facility] in [place] at (f): [phone], and Dr. Michaell Cowing, MD podiatry (f) [phone] confirmations received

## 2023-05-14 NOTE — Unmapped (Signed)
[facility] Refill Coordination Note    Specialty Medication(s) to be Shipped:   Inflammatory Disorders: Humira    Other medication(s) to be shipped: No additional medications requested for fill at this time     Cynthia Ramirez, DOB: 02-21-1961  Phone: [phone] (home)       All above HIPAA information was verified with patient.     Was a Nurse, learning disability used for this call? No    Completed refill call assessment today to schedule patient's medication shipment from the [facility] [phone]).  All relevant notes have been reviewed.     Specialty medication(s) and dose(s) confirmed: Regimen is correct and unchanged.   Changes to medications: [name] reports no changes at this time.  Changes to insurance: No  New side effects reported not previously addressed with a pharmacist or physician: None reported  Questions for the pharmacist: No    Confirmed patient received a Conservation officer, historic buildings and a Surveyor, mining with first shipment. The patient will receive a drug information handout for each medication shipped and additional FDA Medication Guides as required.       DISEASE/MEDICATION-SPECIFIC INFORMATION        For patients on injectable medications: Patient currently has 1 doses left.  Next injection is scheduled for 06/03.    SPECIALTY MEDICATION ADHERENCE     Medication Adherence    Patient reported X missed doses in the last month: 0  Specialty Medication: Humira(CF) pen 40 mg/0.4 mL  Patient is on additional specialty medications: No  Patient is on more than two specialty medications: No  Any gaps in refill history greater than 2 weeks in the last 3 months: no  Demonstrates understanding of importance of adherence: yes  Informant: patient  Reliability of informant: reliable  Provider-estimated medication adherence level: good  Patient is at risk for Non-Adherence: No  Reasons for non-adherence: no problems identified  Confirmed plan for next specialty medication refill: delivery by pharmacy  Refills needed for supportive medications: not needed          Refill Coordination    Has the Patients' Contact Information Changed: No  Is the Shipping Address Different: No         Were doses missed due to medication being on hold? No    humira 40/0.4 mg/ml: 14 days of medicine on hand         REFERRAL TO PHARMACIST     Referral to the pharmacist: Not needed      [facility]     Shipping address confirmed in Epic.       Delivery Scheduled: Yes, Expected medication delivery date: 05/30.     Medication will be delivered via Same Day Courier to the prescription address in Epic WAM.    Antonietta Barcelona   [facility] Specialty Technician

## 2023-05-15 MED ORDER — PENICILLIN V POTASSIUM 500 MG TABLET
ORAL_TABLET | Freq: Three times a day (TID) | ORAL | 0 refills | 7 days | Status: CP
Start: 2023-05-15 — End: 2023-05-22

## 2023-05-15 NOTE — Unmapped (Signed)
Spoke w/ pt advised her about the wait list for non urgent surgeries.  Sh will be called if there are openings.

## 2023-05-15 NOTE — Unmapped (Signed)
Patient LVM on nurse line stating she has not heard from scheduler for her surgery.  Requests to hear soon.

## 2023-05-16 MED FILL — HUMIRA PEN CITRATE FREE 40 MG/0.4 ML: SUBCUTANEOUS | 28 days supply | Qty: 2 | Fill #5

## 2023-05-17 DIAGNOSIS — J449 Chronic obstructive pulmonary disease, unspecified: Principal | ICD-10-CM

## 2023-05-17 MED ORDER — ADVAIR HFA 230 MCG-21 MCG/ACTUATION AEROSOL INHALER
3 refills | 0 days | Status: CP
Start: 2023-05-17 — End: ?

## 2023-05-20 ENCOUNTER — Ambulatory Visit
Admit: 2023-05-20 | Payer: PRIVATE HEALTH INSURANCE | Attending: Rehabilitative and Restorative Service Providers" | Primary: Rehabilitative and Restorative Service Providers"

## 2023-05-20 NOTE — Unmapped (Signed)
[facility] PT [facility]  OUTPATIENT PHYSICAL THERAPY     05/20/2023    Patient Name: Cynthia Ramirez  Date of Birth:Feb 25, 1961  Session Number: 1  Diagnosis:   Encounter Diagnoses   Name Primary?    Arthritis of right midfoot Yes    Arthritis of both ankles        Onset of Symptoms: 05/18/23  Date of Evaluation: 05/20/23     Dates of Certification: n/a    Chief Complaint/Reason for Referral: right midfoot and anterolateral ankle pain, and left lateral ankle pain  Problem List: Pain, Decreased range of motion    ASSESSMENT:    Assessment: Bilateral ankle mortise pain and right midfoot pain secondary to non-supportive shoes and foot pronation driven by body weight, limited ankle dorsiflexion, and forefoot varus.       Short Term Goals:  Patient/Family Goals: Decrease pain, Improve ambulation    Short Term Goal 1: Significant reduction in foot and ankle pain to 1-2/10 following 2 weeks of wearing more supportive shoes.  Time Frame : 2 weeks                                                                            Long Term Goals:                                                                                    PLAN:  Other Follow-up / Frequency - Other: as needed for Duration: as needed    Next Visit Plan: as needed    SUBJECTIVE:  Subjective : The patient attends clinic today with her neice and her partner.  We saw her in 2021 for foot orthotic intervention to address right dorsal midfoot pain associated with arch collapse.  Today she ambulates into clinic using a Rollator that has hand grips that are very much too high for the patient.  She complains of bilateral lateral ankle pain and right dorsal midfoot pain.  She has stopped wearing the foot orthoses that we made for her. and has gone to wearing slip on shoes that are not very supportive.  Her neice lives in the home with the patient and her partner and helps out with management of daily needs that they have.  Precautions: None OBJECTIVE:  Objective: We adjusted the patient's Rollator so that the handgrips were at a better height.  The patient is pronating bilaterally secondary to her body weight, some forefoot varus, and tight triceps surae.  Today we added heel lift to the orthoses that we made as well as medial forefoot posting.  We tried these modified orthoses in a pair of more supportive shoes that we keep in our clinic and the patient's bilateral foot and ankle pain was reduced.  We recomended that the patient procure more supportive slip on shoes.  Education Provided: equipment recommendations, importance of therapy, Role of therapy in Rehabiliation  Education results: Verbalized understanding       Total Time: 30    Today's Charges (noted here with $$):      We did not bill for this visit.                  I attest that I have reviewed the above information.  Signed: Ceasar Lund, PT  05/20/2023 2:53 PM

## 2023-05-29 MED ORDER — PREDNISONE 5 MG TABLET
ORAL_TABLET | Freq: Every day | ORAL | 1 refills | 0 days
Start: 2023-05-29 — End: ?

## 2023-05-29 NOTE — Unmapped (Signed)
Received a voicemail from [facility] specialists stating that they are needing an order from the provier for patient's CPAP and supplies.    [facility] Specialist representative back, no answer.  Left voicemail informing representative that patient's sleep study for CPAP is not scheduled until 07/2023.  Informed [facility] specialists that if they had any additional questions, they may call clinic back.

## 2023-05-30 MED ORDER — PREDNISONE 5 MG TABLET
ORAL_TABLET | Freq: Every day | ORAL | 1 refills | 90 days
Start: 2023-05-30 — End: ?

## 2023-05-30 NOTE — Unmapped (Signed)
Not taking, per patient

## 2023-06-03 NOTE — Unmapped (Signed)
[facility] Refill Coordination Note    Specialty Medication(s) to be Shipped:   Inflammatory Disorders: Humira    Other medication(s) to be shipped: No additional medications requested for fill at this time     Cynthia Ramirez, Cynthia Ramirez  Phone: [phone] (home)       All above HIPAA information was verified with patient.     Was a Nurse, learning disability used for this call? No    Completed refill call assessment today to schedule patient's medication shipment from the [facility] [phone]).  All relevant notes have been reviewed.     Specialty medication(s) and dose(s) confirmed: Regimen is correct and unchanged.   Changes to medications: Cynthia Ramirez reports no changes at this time.  Changes to insurance: No  New side effects reported not previously addressed with a pharmacist or physician: None reported  Questions for the pharmacist: No    Confirmed patient received a Conservation officer, historic buildings and a Surveyor, mining with first shipment. The patient will receive a drug information handout for each medication shipped and additional FDA Medication Guides as required.       DISEASE/MEDICATION-SPECIFIC INFORMATION        For patients on injectable medications: Patient currently has 1 doses left.  Next injection is scheduled for 7/1.    SPECIALTY MEDICATION ADHERENCE     Medication Adherence    Patient reported X missed doses in the last month: 0  Specialty Medication: humira cf pen 40mg /0.16ml              Were doses missed due to medication being on hold? No    humira cf pen 40mg /0.27ml   : 14 days of medicine on hand       REFERRAL TO PHARMACIST     Referral to the pharmacist: Not needed      [facility]     Shipping address confirmed in Epic.       Delivery Scheduled: Yes, Expected medication delivery date: 6/28.     Medication will be delivered via Same Day Courier to the prescription address in Epic WAM.    Westley Gambles   [facility] Specialty Technician

## 2023-06-05 ENCOUNTER — Ambulatory Visit: Admit: 2023-06-05 | Discharge: 2023-06-05 | Payer: PRIVATE HEALTH INSURANCE

## 2023-06-10 ENCOUNTER — Ambulatory Visit: Admit: 2023-06-10 | Discharge: 2023-06-11

## 2023-06-10 NOTE — Unmapped (Addendum)
Complex Case Management Pre-Assessment Note  Pre-Assessment  NOTE      Summary:  Care Coordinator spoke with patient and verified correct patient using two identifiers today for enrollment in Complex Case Management. Informed patient of Complex Case Management services. Pt has agreed to participate in the Complex Case Management program. Case Manager contact information was provided to patient. Care Coordinator scheduled initial assessment with Case Manager for 06/10/23 at 4:00 PM     General Case management Questions:     General Care Management - Patient Level    Assessment completed with: patient[KH1.1]  Patient lives with: significant other (Comment: lives with partner)[KH1.1]  Support system: siblings, spouse or partner (Comment: brother and sister would provide emotional,transportation support;her partner will provide emotional support)[KH1.1]  Type of residence: private residence[KH1.1]  DME used at home: walker, oxygen/respiratory treatment, scale (Comment: Bi Pap at night;Glucometer;Home is ADA approved)[KH1.2]  Transportation means: personal vehicle, public transportation (Comment: depending on how her arthritis is she may not be able to drive;transportation is a barrier since she lives in [place] as most of her appointments are in [facility] and public transportation will not cross county lines)[KH1.1]  Does your health interfere with activities of daily living?: sleep, eating, self-care, household management, work, Archivist, 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]  Exercise: yes[KH1.1]  Minutes per session: 10[KH1.1]  Times per week: 7[KH1.1]  Type of exercise: leg/arm exercise;walking with her walker;will be doing bicycle exercises[KH1.1]  Follow special diet?: regular[KH1.1]  Interested in seeing dietician?: Yes (Comment: disease centered diet)[KH1.1]  Experiencing side effects from current medications: No (Comment: she is unsure)[KH1.1]  Interested in seeing pharmacist?: No (Comment: not at this time)[RL1.1]  Difficulty keeping appointments: Yes (Comment: some due to not having transportation)[KH1.1]  Need assistance with community resources?: No (Comment: transportation resources;food delivery resources, currently utilizing [employer];financial resources)[RL1.1]  Other significant issues impacting care?: Fluid build up;Bilateral Hip Pain;Rheumatoid Arthritis;shoulder pain utilizes [employer] or someone else will shop for her but does not focus on healthy choices vs the cost;chronic pain causing her to not be able to sleep at night well. Bathing is hard due to recent hip surgery.  Continuing care and coordination of care is difficult with ongoing medical issues. Type II diabetic but not on medication   Cannot use public transportation  Aquatic PT outside of the home[RL1.1]  If so, what home agency do you use?: [facility][KH1.1]       Attribution       KH1.1 Cynthia Ramirez 11/07/22 15:15    KH1.2 Cynthia Ramirez 11/07/22 15:49    RL1.1 Cynthia Ramirez 06/10/23 11:56             History Review:     Past Medical History:   Diagnosis Date    Abnormal mammogram     At risk for falls     Cognitive impairment     has a hard time remembering    COPD (chronic obstructive pulmonary disease) (CMS-HCC)     COPD (chronic obstructive pulmonary disease) (CMS-HCC) 03/06/2018    Diabetes mellitus (CMS-HCC)     Dizziness     Essential hypertension, benign     Family history of diabetes mellitus (DM)     Financial difficulties     Hearing impairment     has a hole in her eardrum    HL (hearing loss)     Impaired mobility     Lack of access to transportation     Nonadherence to medication     Problem with transportation  Rheumatoid arthritis (CMS-HCC) 2017    Marked elevation of rheumatoid factor 130.5  and CCP>250    Tobacco use disorder     Visual impairment     hard to see per patient     Caregiver burden No   Cognitive Impairment Yes   Falls Risk Yes   Financial difficulty Yes   Frail Elderly No   Hearing impairment/loss Yes   Homeless No   Impaired mobility Yes   Inadequate social/family support No   Ineffective family coping No   Low Literacy No   Nonadherence to medication Yes   Non-english speaking No   Terminal Illness/Hospice No   Transportation barriers Yes   Visual impairment Yes     Past Surgical History:   Procedure Laterality Date    BREAST BIOPSY Left 02/2016    HYSTERECTOMY      TAH before age 6    INNER EAR SURGERY Bilateral     2004 and earlier -- unspecified nature of surgery    PILONIDAL CYST DRAINAGE      PR COLONOSCOPY FLX DX W/COLLJ SPEC WHEN PFRMD N/A 03/13/2016    Procedure: COLONOSCOPY, FLEXIBLE, PROXIMAL TO SPLENIC FLEXURE; DIAGNOSTIC, W/WO COLLECTION SPECIMEN BY BRUSH OR WASH;  Surgeon: Liane Comber, MD;  Location: [facility] [place];  Service: Gastroenterology    PR FEMORAL FX, OPEN TX Right 07/19/2022    Procedure: HEMI OPEN TREATMENT OF FEMORAL FRACTURE, PROXIMAL END, NECK, INTERNAL FIXATION OR PROSTHETIC REPLACEMENT - modifier 22;  Surgeon: Valda Favia, MD;  Location: MAIN OR [facility];  Service: Orthopedics    right hip surgery Right 07/19/2022    TONSILLECTOMY Bilateral 1973 approx     Family History   Problem Relation Age of Onset    Cancer Mother     Colon cancer Mother     Heart disease Father     Heart disease Sister     Diabetes Brother     Diabetes Brother     Diabetes Brother     Breast cancer Neg Hx     Melanoma Neg Hx     Basal cell carcinoma Neg Hx     Squamous cell carcinoma Neg Hx      Counseling given: Not Answered  Tobacco comments: Pt stated stopped since 01/21/22    Counseling given: Not Answered  Tobacco comments: Pt stated stopped since 01/21/22                     Social History     Substance and Sexual Activity   Drug Use No     Social History     Substance and Sexual Activity   Sexual Activity Not on file       Self-Efficacy Score:  SCORE: 2.33 (11/13/2022  4:05 PM)      Medications:   Outpatient Encounter Medications as of 06/10/2023   Medication Sig Dispense Refill    acetaminophen (TYLENOL) 325 MG tablet Take 2 tablets (650 mg total) by mouth two (2) times a day as needed.      ADALIMUMAB PEN CITRATE FREE 40 MG/0.4 ML Inject the contents of 1 pen (40 mg total) under the skin every fourteen (14) days. 6 each 3    albuterol HFA 90 mcg/actuation inhaler Inhale 2 puffs every six (6) hours as needed for wheezing. 8 g 11    ascorbic acid (VITAMIN C ORAL) Take 1 tablet by mouth daily.      atorvastatin (LIPITOR) 40 MG tablet Take  1 tablet (40 mg total) by mouth daily. 90 tablet 3    cholecalciferol, vitamin D3-50 mcg, 2,000 unit,, 50 mcg (2,000 unit) tablet Take 1 tablet (50 mcg total) by mouth daily. 2 tablets daily      fluticasone propion-salmeterol (ADVAIR HFA) 230-21 mcg/actuation inhaler INHALE 2 PUFFS TWO TIMES A DAY 36 g 3    folic acid (FOLVITE) 1 MG tablet TAKE 1 TABLET BY MOUTH EVERY DAY 90 tablet 3    furosemide (LASIX) 40 MG tablet Take 1 tablet (40 mg total) by mouth daily. Take one tablet daily as needed for weight gain (goal weight ~248-251lbs) (Patient taking differently: Take 0.5 tablets (20 mg total) by mouth daily. Take one tablet daily as needed for edema. Do not administer if SBP<100.)  0    HYDROcodone-acetaminophen (NORCO) 5-325 mg per tablet Take 1 tablet by mouth every six (6) hours as needed.      magnesium oxide (MAG-OX) 400 mg (241.3 mg elemental magnesium) tablet Take 1 tablet (400 mg total) by mouth daily. 90 tablet 3    methotrexate 2.5 MG tablet TAKE 8 TABLETS (20 MG TOTAL) BY MOUTH ONCE A WEEK. 96 tablet 3    multivitamin (TAB-A-VITE/THERAGRAN) per tablet Take 1 tablet by mouth daily.      omeprazole (PRILOSEC) 20 MG capsule TAKE 1 CAPSULE BY MOUTH EVERY DAY IN THE MORNING 90 capsule 4    [EXPIRED] penicillin v potassium (VEETID) 500 MG tablet Take 1 tablet (500 mg total) by mouth every eight (8) hours for 7 days. 21 tablet 0    potassium chloride 20 MEQ ER tablet Take 1 tablet (20 mEq total) by mouth daily. 90 tablet 3    tiotropium bromide (SPIRIVA RESPIMAT) 2.5 mcg/actuation inhalation mist Inhale 2 puffs daily. 16 g 11    triamcinolone (KENALOG) 0.1 % ointment Apply topically Two (2) times a day. 15 g 0    [DISCONTINUED] umeclidinium-vilanteroL (ANORO ELLIPTA) 62.5-25 mcg/actuation inhaler Inhale 1 puff BY MOUTH daily. For benefit inquiry only. Please send Epic chat message to Sejal K with coverage information, then discontinue Rx. 60 each 0     No facility-administered encounter medications on file as of 06/10/2023.        Social History Review:     Physicist, medical Strain: High Risk (06/10/2023)    Overall Financial Resource Strain (CARDIA)     Difficulty of Paying Living Expenses: Very hard      Food Insecurity: Food Insecurity Present (06/10/2023)    Hunger Vital Sign     Worried About Running Out of Food in the Last Year: Sometimes true     Ran Out of Food in the Last Year: Sometimes true      Transportation Needs: Unmet Transportation Needs (06/10/2023)    PRAPARE - Therapist, art (Medical): Yes     Lack of Transportation (Non-Medical): Yes      Physical Activity: Inactive (06/10/2023)    Exercise Vital Sign     Days of Exercise per Week: 0 days     Minutes of Exercise per Session: 0 min      Stress: Stress Concern Present (06/10/2023)    [employer] of Occupational Health - Occupational Stress Questionnaire     Feeling of Stress : To some extent      Intimate Partner Violence: Not At Risk (06/10/2023)    Humiliation, Afraid, Rape, and Kick questionnaire     Fear of Current or Ex-Partner: No  Emotionally Abused: No     Physically Abused: No     Sexually Abused: No      Alcohol Use: Not At Risk (06/10/2023)    Alcohol Use     How often do you have a drink containing alcohol?: Never     How many drinks containing alcohol do you have on a typical day when you are drinking?: 1 - 2     How often do you have 5 or more drinks on one occasion?: Never      Tobacco Use: Medium Risk (06/10/2023)    Patient History Smoking Tobacco Use: Former     Smokeless Tobacco Use: Never     Passive Exposure: Past      Depression: Not at risk (11/13/2022)    PHQ-2     PHQ-2 Score: 2        Upcoming Appointment (s):   Future Appointments   Date Time Provider Department Center   06/10/2023  4:00 PM Jackquline [facility]   06/18/2023  2:10 PM Col[place], Cristin Mulderig, MD [facility]   07/23/2023  6:30 PM SLEEP 9 [id number]   09/19/2023 11:40 AM Malvin Johns, DO [facility]         This patient is currently under review for Complex Case Management services. For progress, care plan changes, updates or recent discharges please contact CM.          Verbena Boeding Ernesta Amble

## 2023-06-10 NOTE — Unmapped (Signed)
Complex Case Management      Full Assessment Note                 06/10/2023     Summary:   Case Manager verified correct patient using two identifiers today for enrollment in Complex Case Management. Informed patient of Complex Case Management services. Patient has agreed to participate in the Complex Case Management program. Case Manager contact information was provided to patient. Case manager has reviewed [facility] Pre-Assessment notes and addressed needs identified in current assessment note. Next call scheduled for 06/24/23.    General Care Management - Patient Level    Assessment completed with: patient[KH1.1]  Patient lives with: significant other (Comment: lives with partner)[KH1.1]  Support system: siblings, spouse or partner (Comment: brother and sister would provide emotional,transportation support;her partner will provide emotional support)[KH1.1]  Type of residence: private residence[KH1.1]  DME used at home: walker, oxygen/respiratory treatment, scale (Comment: Bi Pap at night;Glucometer;Home is ADA approved)[KH1.2]  Transportation means: personal vehicle, public transportation (Comment: depending on how her arthritis is she may not be able to drive;transportation is a barrier since she lives in [place] as most of her appointments are in [facility] and public transportation will not cross county lines)[KH1.1]  Does your health interfere with activities of daily living?: sleep, eating, self-care, household management, work, Archivist, 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]  Exercise: yes[KH1.1]  Minutes per session: 10[KH1.1]  Times per week: 7[KH1.1]  Type of exercise: leg/arm exercise;walking with her walker;will be doing bicycle exercises[KH1.1]  Follow special diet?: regular[KH1.1]  Interested in seeing dietician?: Yes (Comment: disease centered diet)[KH1.1]  Experiencing side effects from current medications: No (Comment: she is unsure)[KH1.1]  Interested in seeing pharmacist?: No (Comment: not at this time)[RL1.1]  Difficulty keeping appointments: Yes (Comment: some due to not having transportation)[KH1.1]  Need assistance with community resources?: No (Comment: transportation resources;food delivery resources, currently utilizing [facility];financial resources)[RL1.1]  Other significant issues impacting care?: Fluid build up;Bilateral Hip Pain;Rheumatoid Arthritis;shoulder pain utilizes [employer] or someone else will shop for her but does not focus on healthy choices vs the cost;chronic pain causing her to not be able to sleep at night well. Bathing is hard due to recent hip surgery.  Continuing care and coordination of care is difficult with ongoing medical issues. Type II diabetic but not on medication   Cannot use public transportation  Aquatic PT outside of the home  Patient's partner has Alzheimer's. [RL1.2]  If so, what home agency do you use?: [facility][KH1.1]       Attribution       KH1.1 [name] 11/07/22 15:15    KH1.2 [name] 11/07/22 15:49    RL1.1 Susann Givens 06/10/23 11:56    RL1.2 Susann Givens 06/10/23 15:04            Primary Health Concern:       What is your/your child's primary health concern?: ambulation    Assessment:       Completed By: Patient    Self Health:       How would you describe you or your child's current physical health?: Good       How would you describe you or your child's current mental health?: Good       Thinking of your recent appointments or visits, are there any instructions that you do not understand or may have difficulty following? : No               What concerns would cause you to  go to the ED (Emergency Department) rather than your PCP/other provider? : SOB, pain uncontrolled         Chronic Pain:       Do you/ your child have chronic pain?: No    Dietary Needs:       Do you/your child have any special dietary needs or restrictions?: No    Health Summary:       Summary of Self-Health, chronic pain, dietary needs, and medication : Primary concern is her ambulation,physical/mental health good,understands the MD,no Behavioral Health but would like a Therapistno chronic pain,so special diet restrictions    Self-Efficacy Assessment:       How confident are you that you can keep the fatigue caused by your disease from interfering with the things you want to do?: 5       How confident are you that you can keep the physical discomfort or pain of your disease from interfering with the things you want to do?: 1       How confident are you that you can keep the emotional distress caused by your disease from interfering with the things you want to do?: 6       How confident are you that you can keep any other symptoms or health problems you have from interfering with the things you want to do?: 7       How confident are you that you can do the different tasks and activities needed to manage your health condition so as to reduce you need to see a doctor?: 10       How confident are you that you can do things other than just taking medication to reduce how much you illness affects your everyday life?: 8       SCORE: 6.17    ADL/IADL:       Dressing - Ability to perform: Independent       Grooming - Ability to perform: Independent       Feeding - Ability to perform: Independent       Bathing - Ability to perform: Needs assistance (at times)       Toileting - Ability to perform: Independent       Transfering- Ability to perform: Independent       Continence: (!) Urinary incontinence (pad inserts)       Household Duties - Do you have a problem with any of the following:: Independent       Summary of ADL/IADL: Independent with ADLs,needs assist with bath at times,incontinent of urine(states some dribbling)weras pad inserts,able to perform household duties but it takes time to get it done    Vision:       Patient's Vision Adequate to Safely Complete Daily Activities: Yes    Learning and Disability:       History of attendance at a Special Education program or setting?: No Intellectual Disability?: No       Learning Disability?: No    ACS Disability Status:       Are you deaf or do you have serious difficulty hearing?: No       Do you wear hearing aids?: No       Do you use any assistive devices to communicate?: No       Summary of Vision, Driving, Reading/Writing, and Disability Status : Vision adequate,no learning disabilities,endorses some trouble hearing,no problems communicating,wants hearing checked as it has been a long time since this was done    Support:  Do you have a caregiver/social support?: Yes       What types of support does your caregiver provide for you?: Emotional and/or Social Support                       Summary of Caregiver/Social Support: Emotional social/support from family    Patient/Family Medical Concerns:       Oriented to person, place, time, situation: Oriented x 4       Do you have concerns about your memory?: No       Does anyone in your family have concerns about your memory?: No    CM Assessment:       Summary of Memory and Cognitive Status: Oriented x 4,no memory concerns    Advance Care Planning Review:       ACP Reviewed: Yes    Life Planning:       Do risk factors indicate an imminent need for Long Term Services and Support?: No       Does the patient have any upcoming life transitions?: No               Summary of Advanced Care and Life Planning: ACP reviewed,no imminent need for Long Term Services,no known upcoming Life Transitions    Culture and Beliefs:       Is there anything I should know about your culture, beliefs, or religious practices that would help me take better care of you?: No    Insurance Benefits:       What is your current understanding of your insurance benefits?: Good    [place] - Education:                                                                                                      In[place] - Child Welfare:                                                    Barriers to Care:       What are the patient's barriers? (Select all that apply): [employer], Surveyor, quantity Stress, Product/process development scientist, Caregiver Stress (Transportation:needs transportation to and from MD appts in [place],Financial Stress:needs assist with food and utilities,Difficulty Accessing Healthcare:ENT and Public relations account executive Stress:find support groups/activities she can do with partn)       Summary of barriers to care, SDOH, and other identified patient care needs (including community engagement, justice involvement, home health, and benefits): Transportation:needs transportation to and from MD appts in [place],Financial Stress:needs assist with food and utilities,Difficulty Accessing Healthcare:ENT and Public relations account executive Stress:find support groups/activities she can do with partner who has Dementia    CM Summary:       Patient needs/concerns addressed today:: Transportation:needs transportation to and from MD appts in [place],Financial Stress:needs assist with food and utilities,Difficulty Accessing Healthcare:ENT and Behavior Therapist,Caregiver Stress:find support groups/activities she can do with partner who has  Dementia       Care Plan items covered today:: Transportation:needs transportation to and from MD appts in [place],Financial Stress:needs assist with food and utilities,Difficulty Accessing Healthcare:ENT and Behavior Therapist,Caregiver Stress:find support groups/activities she can do with partner who has Dementia       Care plan items planned for next conversation:: Transportation:needs transportation to and from MD appts in [place],Financial Stress:needs assist with food and utilities,Difficulty Accessing Healthcare:ENT and Behavior Therapist,Caregiver Stress:find support groups/activities she can do with partner who has Dementia    History Review:         Past Medical History:   Diagnosis Date    Abnormal mammogram     At risk for falls     Cognitive impairment     has a hard time remembering    COPD (chronic obstructive pulmonary disease) (CMS-HCC)     COPD (chronic obstructive pulmonary disease) (CMS-HCC) 03/06/2018    Diabetes mellitus (CMS-HCC)     Dizziness     Essential hypertension, benign     Family history of diabetes mellitus (DM)     Financial difficulties     Hearing impairment     has a hole in her eardrum    HL (hearing loss)     Impaired mobility     Lack of access to transportation     Nonadherence to medication     Problem with transportation     Rheumatoid arthritis (CMS-HCC) 2017    Marked elevation of rheumatoid factor 130.5  and CCP>250    Tobacco use disorder     Visual impairment     hard to see per patient             Caregiver burden No   Cognitive Impairment Yes   Falls Risk Yes   Financial difficulty Yes   Frail Elderly No   Hearing impairment/loss Yes   Homeless No   Impaired mobility Yes   Inadequate social/family support No   Ineffective family coping No   Low Literacy No   Nonadherence to medication Yes   Non-english speaking No   Terminal Illness/Hospice No   Transportation barriers Yes   Visual impairment Yes             Past Surgical History:   Procedure Laterality Date    BREAST BIOPSY Left 02/2016    HYSTERECTOMY      TAH before age 61    INNER EAR SURGERY Bilateral     2004 and earlier -- unspecified nature of surgery    PILONIDAL CYST DRAINAGE      PR COLONOSCOPY FLX DX W/COLLJ SPEC WHEN PFRMD N/A 03/13/2016    Procedure: COLONOSCOPY, FLEXIBLE, PROXIMAL TO SPLENIC FLEXURE; DIAGNOSTIC, W/WO COLLECTION SPECIMEN BY BRUSH OR WASH;  Surgeon: Liane Comber, MD;  Location: [facility]ED[facility] [place];  Service: Gastroenterology    PR FEMORAL FX, OPEN TX Right 07/19/2022    Procedure: HEMI OPEN TREATMENT OF FEMORAL FRACTURE, PROXIMAL END, NECK, INTERNAL FIXATION OR PROSTHETIC REPLACEMENT - modifier 22;  Surgeon: Valda Favia, MD;  Location: MAIN OR [facility];  Service: Orthopedics    right hip surgery Right 07/19/2022    TONSILLECTOMY Bilateral 1973 approx Family History   Problem Relation Age of Onset    Cancer Mother     Colon cancer Mother     Heart disease Father     Heart disease Sister     Diabetes Brother     Diabetes Brother     Diabetes  Brother     Breast cancer Neg Hx     Melanoma Neg Hx     Basal cell carcinoma Neg Hx     Squamous cell carcinoma Neg Hx            Counseling given: Not Answered  Tobacco comments: Pt stated stopped since 01/21/22          Counseling given: Not Answered  Tobacco comments: Pt stated stopped since 01/21/22                                   Social History     Substance and Sexual Activity   Drug Use No             Social History     Substance and Sexual Activity   Sexual Activity Not on file       Medications:         Outpatient Encounter Medications as of 06/10/2023   Medication Sig Dispense Refill    acetaminophen (TYLENOL) 325 MG tablet Take 2 tablets (650 mg total) by mouth two (2) times a day as needed.      ADALIMUMAB PEN CITRATE FREE 40 MG/0.4 ML Inject the contents of 1 pen (40 mg total) under the skin every fourteen (14) days. 6 each 3    albuterol HFA 90 mcg/actuation inhaler Inhale 2 puffs every six (6) hours as needed for wheezing. 8 g 11    ascorbic acid (VITAMIN C ORAL) Take 1 tablet by mouth daily.      atorvastatin (LIPITOR) 40 MG tablet Take 1 tablet (40 mg total) by mouth daily. 90 tablet 3    cholecalciferol, vitamin D3-50 mcg, 2,000 unit,, 50 mcg (2,000 unit) tablet Take 1 tablet (50 mcg total) by mouth daily. 2 tablets daily      fluticasone propion-salmeterol (ADVAIR HFA) 230-21 mcg/actuation inhaler INHALE 2 PUFFS TWO TIMES A DAY 36 g 3    folic acid (FOLVITE) 1 MG tablet TAKE 1 TABLET BY MOUTH EVERY DAY 90 tablet 3    furosemide (LASIX) 40 MG tablet Take 1 tablet (40 mg total) by mouth daily. Take one tablet daily as needed for weight gain (goal weight ~248-251lbs) (Patient taking differently: Take 0.5 tablets (20 mg total) by mouth daily. Take one tablet daily as needed for edema. Do not administer if SBP<100.)  0    HYDROcodone-acetaminophen (NORCO) 5-325 mg per tablet Take 1 tablet by mouth every six (6) hours as needed.      magnesium oxide (MAG-OX) 400 mg (241.3 mg elemental magnesium) tablet Take 1 tablet (400 mg total) by mouth daily. 90 tablet 3    methotrexate 2.5 MG tablet TAKE 8 TABLETS (20 MG TOTAL) BY MOUTH ONCE A WEEK. 96 tablet 3    multivitamin (TAB-A-VITE/THERAGRAN) per tablet Take 1 tablet by mouth daily.      omeprazole (PRILOSEC) 20 MG capsule TAKE 1 CAPSULE BY MOUTH EVERY DAY IN THE MORNING 90 capsule 4    [EXPIRED] penicillin v potassium (VEETID) 500 MG tablet Take 1 tablet (500 mg total) by mouth every eight (8) hours for 7 days. 21 tablet 0    potassium chloride 20 MEQ ER tablet Take 1 tablet (20 mEq total) by mouth daily. 90 tablet 3    tiotropium bromide (SPIRIVA RESPIMAT) 2.5 mcg/actuation inhalation mist Inhale 2 puffs daily. 16 g 11    triamcinolone (KENALOG) 0.1 % ointment Apply topically  Two (2) times a day. 15 g 0    [DISCONTINUED] umeclidinium-vilanteroL (ANORO ELLIPTA) 62.5-25 mcg/actuation inhaler Inhale 1 puff BY MOUTH daily. For benefit inquiry only. Please send Epic chat message to Sejal K with coverage information, then discontinue Rx. 60 each 0     No facility-administered encounter medications on file as of 06/10/2023.       Social History Review:           Physicist, medical Strain: High Risk (06/10/2023)    Overall Financial Resource Strain (CARDIA)     Difficulty of Paying Living Expenses: Very hard              Food Insecurity: Food Insecurity Present (06/10/2023)    Hunger Vital Sign     Worried About Running Out of Food in the Last Year: Sometimes true     Ran Out of Food in the Last Year: Sometimes true              Transportation Needs: Unmet Transportation Needs (06/10/2023)    PRAPARE - Therapist, art (Medical): Yes     Lack of Transportation (Non-Medical): Yes              Physical Activity: Inactive (06/10/2023)    Exercise Vital Sign Days of Exercise per Week: 0 days     Minutes of Exercise per Session: 0 min              Stress: Stress Concern Present (06/10/2023)    [employer] of Occupational Health - Occupational Stress Questionnaire     Feeling of Stress : To some extent              Intimate Partner Violence: Not At Risk (06/10/2023)    Humiliation, Afraid, Rape, and Kick questionnaire     Fear of Current or Ex-Partner: No     Emotionally Abused: No     Physically Abused: No     Sexually Abused: No              Alcohol Use: Not At Risk (06/10/2023)    Alcohol Use     How often do you have a drink containing alcohol?: Never     How many drinks containing alcohol do you have on a typical day when you are drinking?: 1 - 2     How often do you have 5 or more drinks on one occasion?: Never              Tobacco Use: Medium Risk (06/10/2023)    Patient History     Smoking Tobacco Use: Former     Smokeless Tobacco Use: Never     Passive Exposure: Past              Depression: Not at risk (06/10/2023)    PHQ-2     PHQ-2 Score: 1         PHQ-2 Total Score : 1         Patient's Rights and Responsibilities:  Patient Rights and Responsibilities reviewed with enclosed Welcome Letter via Care Plan: Yes    This patient is currently under review for Complex Case Management services. For progress, care plan changes, updates or recent discharges please contact CM.          Future Appointments   Date Time Provider Department Center   06/18/2023  2:10 PM Colford, Cristin Mulderig, MD [facility]   07/23/2023  6:30 PM SLEEP 9 [id number]   09/19/2023 11:40 AM Malvin Johns, DO [facility]             Adrian Prince - Case Manager   [facility]  [address]  [place], [place] [address]  p. [phone]   [email or web address]

## 2023-06-14 MED FILL — HUMIRA PEN CITRATE FREE 40 MG/0.4 ML: SUBCUTANEOUS | 28 days supply | Qty: 2 | Fill #6

## 2023-06-24 ENCOUNTER — Ambulatory Visit: Admit: 2023-06-24 | Discharge: 2023-06-25 | Payer: PRIVATE HEALTH INSURANCE

## 2023-06-24 NOTE — Unmapped (Signed)
COMPLEX CASE MANAGEMENT   Brief Note    CMA sent resources to assist with tasks to reduce barriers to care( Transportation) to patient in MyChart    Plan:     1. Case Management to: Follow up in 1 weeks    2. Patient/caregiver to: review resources    Discuss at next outreach:   barriers to careFilm/video editor)     Care Coordination Note updated in [facility]: Yes      Beulah Gandy Risk Care Coordinator  [facility]  [address], [address]  [place], [place] [address]  She/Her/Hers  P: [phone] F: [phone]  [email or web address]

## 2023-06-24 NOTE — Unmapped (Signed)
COMPLEX CASE MANAGEMENT   FOLLOW UP NOTE  Summary:  Complex Case Manager  spoke with patient and verified correct patient using two identifiers today for Complex Case Management follow up. Patient currently resides at Home. Primary concern is transportation and food.      Subjective:    Patient/caregiver reported she was doing well.      Objective:     Screenings Completed during visit: None completed at this visit    Barriers to care: Transportation and Financial Stress    Interventions provided: Connection to [employer]: for transportation and food    Progress towards  goal : On Track      Plan:     1. Case Management to: Follow up in 2 weeks    2. Patient/caregiver to: notify PCP/CM of questions/concerns    Discuss at next outreach: Referral Follow-Up    Care Coordination Note updated in [facility]: Yes    Future Appointments   Date Time Provider Department Center   07/22/2023 10:30 AM Colford, Jackquline Berlin, MD [facility]   07/23/2023  6:30 PM SLEEP 9 [id number]   08/05/2023  1:00 PM Melendez-Gonzalez, Alycia Patten, MD [facility]   09/19/2023 11:40 AM Malvin Johns, DO [facility]       Problem List             Diagnosed      Family history of diabetes mellitus       Lumbar nerve root disorder       Rheumatoid arthritis involving multiple sites with positive rheumatoid factor       Chronic airway obstruction       Type 2 diabetes       Heart failure       Subcapital fracture of neck of right femur       Lesion of left native kidney        Allergies:   Allergies   Allergen Reactions    Doxycycline Hcl Hives    Shellfish Containing Products Swelling and Other (See Comments)     Swelling of lips, no swelling of tongue or difficulty breathing.    Swelling of lips, no swelling of tongue or difficulty breathing.UnknownUnknownSwelling of lips, no swelling of tongue or difficulty breathing. Unknown    Swelling of lips, no swelling of tongue or difficulty breathing.   Unknown   Unknown   Swelling of lips, no swelling of tongue or difficulty breathing.    Sulfa (Sulfonamide Antibiotics) Other (See Comments) and Nausea Only     Stomach issue       Medications:  Prior to Admission medications    Medication Dose, Route, Frequency   acetaminophen (TYLENOL) 325 MG tablet 650 mg, Oral, 2 times a day PRN   ADALIMUMAB PEN CITRATE FREE 40 MG/0.4 ML Inject the contents of 1 pen (40 mg total) under the skin every fourteen (14) days.   albuterol HFA 90 mcg/actuation inhaler 2 puffs, Inhalation, Every 6 hours PRN   ascorbic acid (VITAMIN C ORAL) 1 tablet, Oral, Daily (standard)   atorvastatin (LIPITOR) 40 MG tablet 40 mg, Oral, Daily (standard)   cholecalciferol, vitamin D3-50 mcg, 2,000 unit,, 50 mcg (2,000 unit) tablet 50 mcg, Oral, Daily (standard), 2 tablets daily   fluticasone propion-salmeterol (ADVAIR HFA) 230-21 mcg/actuation inhaler INHALE 2 PUFFS TWO TIMES A DAY   folic acid (FOLVITE) 1 MG tablet 1,000 mcg, Oral, Daily (standard)   furosemide (LASIX) 40 MG tablet 40 mg, Oral, Daily (  standard), Take one tablet daily as needed for weight gain (goal weight ~248-251lbs)  Patient taking differently: Take 0.5 tablets (20 mg total) by mouth daily. Take one tablet daily as needed for edema. Do not administer if SBP<100.   HYDROcodone-acetaminophen (NORCO) 5-325 mg per tablet 1 tablet, Oral, Every 6 hours PRN   magnesium oxide (MAG-OX) 400 mg (241.3 mg elemental magnesium) tablet 400 mg, Oral, Daily (standard)   methotrexate 2.5 MG tablet 20 mg, Oral, Weekly   multivitamin (TAB-A-VITE/THERAGRAN) per tablet 1 tablet, Oral, Daily (standard)   omeprazole (PRILOSEC) 20 MG capsule TAKE 1 CAPSULE BY MOUTH EVERY DAY IN THE MORNING   potassium chloride 20 MEQ ER tablet 20 mEq, Oral, Daily (standard)   tiotropium bromide (SPIRIVA RESPIMAT) 2.5 mcg/actuation inhalation mist 5 mcg, Inhalation, Daily (standard)   triamcinolone (KENALOG) 0.1 % ointment Topical, 2 times a day (standard) umeclidinium-vilanteroL (ANORO ELLIPTA) 62.5-25 mcg/actuation inhaler Inhale 1 puff BY MOUTH daily. For benefit inquiry only. Please send Epic chat message to Sejal K with coverage information, then discontinue Rx.          Jackquline Denmark, RN Care Manager  06/24/2023

## 2023-06-26 NOTE — Unmapped (Signed)
COMPLEX CASE MANAGEMENT   Brief Note      CMA sent resources to assist with tasks to reduce barriers to care( Utilities) in MyChart to patient.    Plan:     1. Case Management to: Follow up in 1 weeks    2. Patient/caregiver to: review resources    Discuss at next outreach:   reduce barriers to care( Utilities)    Care Coordination Note updated in [facility]: Yes    Beulah Gandy Risk Care Coordinator  [facility]  [address]  [place], [place] [address]  She/Her/Hers  P: [phone] F: [phone]  [email or web address]

## 2023-07-01 NOTE — Unmapped (Signed)
[facility] Refill Coordination Note    Specialty Medication(s) to be Shipped:   Inflammatory Disorders: Humira    Other medication(s) to be shipped: No additional medications requested for fill at this time     Cynthia Ramirez, DOB: 01-29-1961  Phone: [phone] (home)       All above HIPAA information was verified with patient.     Was a Nurse, learning disability used for this call? No    Completed refill call assessment today to schedule patient's medication shipment from the [facility] [phone]).  All relevant notes have been reviewed.     Specialty medication(s) and dose(s) confirmed: Regimen is correct and unchanged.   Changes to medications: Calvina reports no changes at this time.  Changes to insurance: No  New side effects reported not previously addressed with a pharmacist or physician: None reported  Questions for the pharmacist: No    Confirmed patient received a Conservation officer, historic buildings and a Surveyor, mining with first shipment. The patient will receive a drug information handout for each medication shipped and additional FDA Medication Guides as required.       DISEASE/MEDICATION-SPECIFIC INFORMATION        For patients on injectable medications: Patient currently has 1 doses left.  Next injection is scheduled for 7/29.    SPECIALTY MEDICATION ADHERENCE     Medication Adherence    Patient reported X missed doses in the last month: 0  Specialty Medication: Humira              Were doses missed due to medication being on hold? No    Humira 40  mg/0.37ml : 14 days of medicine on hand     REFERRAL TO PHARMACIST     Referral to the pharmacist: Not needed      [facility]     Shipping address confirmed in Epic.       Delivery Scheduled: Yes, Expected medication delivery date: 7/29.     Medication will be delivered via Same Day Courier to the prescription address in Epic WAM.    Clydell Hakim, PharmD   [facility] Specialty Pharmacist

## 2023-07-08 NOTE — Unmapped (Signed)
COMPLEX CASE MANAGEMENT   FOLLOW UP NOTE  Summary:  Complex Case Manager  spoke with patient and verified correct patient using two identifiers today for Complex Case Management follow up. Patient currently resides at Home. Primary concern is finding a Paramedic and ENT MD.      Subjective:    Patient/caregiver reported she was doing well. She reports she is now set up with a food pantry.      Objective:     Screenings Completed during visit: None completed at this visit    Barriers to care: Difficulty Accessing Healthcare    Interventions provided: Connection to External Provider: Behavior Therapist and ENT    Progress towards  goal : On Track      Plan:     1. Case Management to: Follow up in 2 weeks    2. Patient/caregiver to: notify PCP/CM of questions/concerns    Discuss at next outreach: Referral Follow-Up    Care Coordination Note updated in [facility]: Yes    Future Appointments   Date Time Provider Department Center   07/22/2023 10:30 AM Colford, Jackquline Berlin, MD [facility]   07/22/2023  2:00 PM Jackquline Denmark Cherry PHA [facility]   07/23/2023  6:30 PM SLEEP 9 [id number]   08/05/2023  1:00 PM Melendez-Gonzalez, Alycia Patten, MD [facility]   09/19/2023 11:40 AM Malvin Johns, DO [facility]       Problem List             Diagnosed      Family history of diabetes mellitus       Lumbar nerve root disorder       Rheumatoid arthritis involving multiple sites with positive rheumatoid factor       Chronic airway obstruction       Type 2 diabetes       Heart failure       Subcapital fracture of neck of right femur       Lesion of left native kidney        Allergies:   Allergies   Allergen Reactions    Doxycycline Hcl Hives    Shellfish Containing Products Swelling and Other (See Comments)     Swelling of lips, no swelling of tongue or difficulty breathing.    Swelling of lips, no swelling of tongue or difficulty breathing.UnknownUnknownSwelling of lips, no swelling of tongue or difficulty breathing. Unknown    Swelling of lips, no swelling of tongue or difficulty breathing.   Unknown   Unknown   Swelling of lips, no swelling of tongue or difficulty breathing.    Sulfa (Sulfonamide Antibiotics) Other (See Comments) and Nausea Only     Stomach issue       Medications:  Prior to Admission medications    Medication Dose, Route, Frequency   acetaminophen (TYLENOL) 325 MG tablet 650 mg, Oral, 2 times a day PRN   ADALIMUMAB PEN CITRATE FREE 40 MG/0.4 ML Inject the contents of 1 pen (40 mg total) under the skin every fourteen (14) days.   albuterol HFA 90 mcg/actuation inhaler 2 puffs, Inhalation, Every 6 hours PRN   ascorbic acid (VITAMIN C ORAL) 1 tablet, Oral, Daily (standard)   atorvastatin (LIPITOR) 40 MG tablet 40 mg, Oral, Daily (standard)   cholecalciferol, vitamin D3-50 mcg, 2,000 unit,, 50 mcg (2,000 unit) tablet 50 mcg, Oral, Daily (standard), 2 tablets daily   fluticasone propion-salmeterol (ADVAIR HFA) 230-21 mcg/actuation inhaler INHALE 2 PUFFS TWO TIMES A  DAY   folic acid (FOLVITE) 1 MG tablet 1,000 mcg, Oral, Daily (standard)   furosemide (LASIX) 40 MG tablet 40 mg, Oral, Daily (standard), Take one tablet daily as needed for weight gain (goal weight ~248-251lbs)  Patient taking differently: Take 0.5 tablets (20 mg total) by mouth daily. Take one tablet daily as needed for edema. Do not administer if SBP<100.   HYDROcodone-acetaminophen (NORCO) 5-325 mg per tablet 1 tablet, Oral, Every 6 hours PRN   magnesium oxide (MAG-OX) 400 mg (241.3 mg elemental magnesium) tablet 400 mg, Oral, Daily (standard)   methotrexate 2.5 MG tablet 20 mg, Oral, Weekly   multivitamin (TAB-A-VITE/THERAGRAN) per tablet 1 tablet, Oral, Daily (standard)   omeprazole (PRILOSEC) 20 MG capsule TAKE 1 CAPSULE BY MOUTH EVERY DAY IN THE MORNING   potassium chloride 20 MEQ ER tablet 20 mEq, Oral, Daily (standard)   tiotropium bromide (SPIRIVA RESPIMAT) 2.5 mcg/actuation inhalation mist 5 mcg, Inhalation, Daily (standard)   triamcinolone (KENALOG) 0.1 % ointment Topical, 2 times a day (standard)   umeclidinium-vilanteroL (ANORO ELLIPTA) 62.5-25 mcg/actuation inhaler Inhale 1 puff BY MOUTH daily. For benefit inquiry only. Please send Epic chat message to Sejal K with coverage information, then discontinue Rx.          Jackquline Denmark, RN Care Manager  07/08/2023

## 2023-07-08 NOTE — Unmapped (Signed)
COMPLEX CASE MANAGEMENT   Brief Note    CMA sent resources to assist with establishing care with ENT and Behavior Therapist in MyChart to patient.    Plan:     1. Case Management to: Follow up in 1 weeks    2. Patient/caregiver to: review resources    Discuss at next outreach:  sent resources to assist with establishing care with ENT and Behavior Therapist     Care Coordination Note updated in [facility] Yes    Beulah Gandy Risk Care Coordinator  [facility]  [address], [address]  [place], [place] [address]  She/Her/Hers  P: [phone] F: [phone]  [email or web address]

## 2023-07-15 MED FILL — HUMIRA PEN CITRATE FREE 40 MG/0.4 ML: SUBCUTANEOUS | 28 days supply | Qty: 2 | Fill #7

## 2023-07-18 NOTE — Unmapped (Signed)
COMPLEX CASE MANAGEMENT   Brief Note  CMA sent resources to assist with different self-care strategies and coping mechanisms( Dementia support groups)     Plan:     1. Case Management to: Follow up in 1 weeks    2. Patient/caregiver to: review resources    Discuss at next outreach: different self-care strategies and coping mechanisms( Dementia support groups)     Care Coordination Note updated in [facility]: Yes    Beulah Gandy Risk Care Coordinator  [facility]  [address], [address]  [place], [place] [address]  She/Her/Hers  P: [phone] F: [phone]  [email or web address]

## 2023-07-21 NOTE — Unmapped (Addendum)
[facility] Internal Medicine (Faculty Practice) Clinic Visit    Reason for Visit: Follow up copd    A/P:      Assessment & Plan  Chronic diastolic heart failure (CMS-HCC)  Have done well with very rare need for furosemide. Cont to monitor weights daily      Orders:    CBC w/ Differential; Future    Basic Metabolic Panel; Future    AST; Future    ALT; Future    Chronic obstructive pulmonary disease, unspecified COPD type (CMS-HCC)  Stabe. Continues on ICA/LABA and LAMA. Has not been using oxygen as often. Will get 6 minute walk at next pulm visit.          Type 2 diabetes mellitus without complication, without long-term current use of insulin (CMS-HCC)  Well controlled. Continue to monitor           Rheumatoid arthritis involving multiple sites with positive rheumatoid factor (CMS-HCC)  Has done very well with adalimumab and methotrexate. Continue to work on PT and exercise.            Urinary frequency at night.   Stop fluid intake 2 hours before bed.       Hx of hip fracture in setting of RA: Patient has a need for greater stability and security than would be obtained with a cane or crutches.She has upper body weakness or the patient would have an unsteady gait when lifting a standard walker without wheels due to her RA which causes grip, hand and wrist pain as well as impaired shoulder mechanics.  Will place order for rollator.     Return in about 3 months (around 10/22/2023).    I personally spent 34 minutes face-to-face and non-face-to-face in the care of this patient, which includes all pre, intra, and post visit time on the date of service.    __________________________________________________________    HPI:    Overall doing well.     Feels like she urinates a lot. Awakens up at night up to every 2 hours.  Wears a pad because she will dribble. Bowels feel more regular. Drinks a lot of water.   Started drinking coffee 1-2 cup.     Some trouble with sleeping. Hard to get comfortable. Sleep study tomorrow. Cpap Mask not working well.     RA: adalimumab and methotrexate have worked well. Doing aquatic therapy. Goal is to get rid of her walker. Right knee is difficult to flex. In water the pain is improved.     DM: doing well with metformin with excellent control.     COPD: not wearing oxygen today. Waering it at night.  Uses at night with cpap. Was using 1-2 Liters. Seeing pulmonary on 08/26/2023 for 6 minute walk    HFpEF: did take 1 dose of lasix last week. Her weight went up 3 lbs over night and had edema. Takes every few months.       __________________________________________________________    Problem List:  Patient Active Problem List   Diagnosis    Tobacco use disorder    Family history of diabetes mellitus (DM)    Lumbar radiculopathy    Rheumatoid arthritis involving multiple sites with positive rheumatoid factor (CMS-HCC)    COPD (chronic obstructive pulmonary disease) (CMS-HCC)    Diabetes mellitus, type 2 (CMS-HCC)    Chronic diastolic heart failure (CMS-HCC)    Subcapital fracture of neck of right femur (CMS-HCC)    Lesion of left native kidney  Medications:  Reviewed in EPIC    ROS: remainder of review of systems negative  __________________________________________________________    Physical Exam:   Vital Signs:  Vitals:    07/22/23 1022 07/22/23 1037   BP: 145/81 130/73   BP Site: R Arm    BP Position: Sitting    Pulse: 74 70   Temp: 37.1 ??C (98.8 ??F)    TempSrc: Oral    SpO2:  95%   Weight: (!) 111.2 kg (245 lb 3.2 oz)    Height: 167.6 cm (5' 6)       Wt Readings from Last 3 Encounters:   07/22/23 (!) 111.2 kg (245 lb 3.2 oz)   05/09/23 (!) 110.7 kg (244 lb)   04/17/23 (!) 109.3 kg (241 lb)     Body mass index is 39.58 kg/m??.    Gen: Well appearing, NAD  CV: RRR, no murmurs  Pulm: CTA bilaterally, no crackles or wheezes  Ext: No edema

## 2023-07-21 NOTE — Unmapped (Addendum)
Well-controlled. Continue to monitor. 

## 2023-07-21 NOTE — Unmapped (Addendum)
Orders:    CBC w/ Differential; Future    Basic Metabolic Panel; Future    AST; Future    ALT; Future

## 2023-07-21 NOTE — Unmapped (Addendum)
Stabe. Continues on ICA/LABA and LAMA. Has not been using oxygen as often. Will get 6 minute walk at next pulm visit.

## 2023-07-22 ENCOUNTER — Ambulatory Visit: Admit: 2023-07-22 | Discharge: 2023-07-22 | Payer: PRIVATE HEALTH INSURANCE

## 2023-07-22 DIAGNOSIS — M0579 Rheumatoid arthritis with rheumatoid factor of multiple sites without organ or systems involvement: Principal | ICD-10-CM

## 2023-07-22 DIAGNOSIS — I5032 Chronic diastolic (congestive) heart failure: Principal | ICD-10-CM

## 2023-07-22 DIAGNOSIS — E119 Type 2 diabetes mellitus without complications: Principal | ICD-10-CM

## 2023-07-22 DIAGNOSIS — J449 Chronic obstructive pulmonary disease, unspecified: Principal | ICD-10-CM

## 2023-07-22 MED ORDER — AEROCHAMBER MV SPACER
Freq: Every day | 0 refills | 1 days | Status: CP
Start: 2023-07-22 — End: ?

## 2023-07-22 MED ORDER — ALBUTEROL SULFATE HFA 90 MCG/ACTUATION AEROSOL INHALER
Freq: Four times a day (QID) | RESPIRATORY_TRACT | 11 refills | 0 days | Status: CP | PRN
Start: 2023-07-22 — End: ?

## 2023-07-22 NOTE — Unmapped (Signed)
COMPLEX CASE MANAGEMENT   FOLLOW UP NOTE  Summary:  Complex Case Manager  spoke with patient and verified correct patient using two identifiers today for Complex Case Management follow up. Patient currently resides at Home. Primary concern is none at this time.      Subjective:    Patient/caregiver reported she was doing well and was excited for sleep study tomorrow to be able to get CPAP/supplies updated.      Objective:     Screenings Completed during visit: None completed at this visit    Barriers to care: Caregiver Stress    Interventions provided: Connection to [employer]: sent resources for Dementia support groups    Progress towards  goal : On Track      Plan:     1. Case Management to: Follow up in 2 weeks    2. Patient/caregiver to: notify PCP/CM of questions/concerns    Discuss at next outreach: Referral Follow-Up    Care Coordination Note updated in [facility]: Yes    Future Appointments   Date Time Provider Department Center   07/23/2023  6:30 PM SLEEP 9 [facility]UR[facility]P1[facility]   08/05/2023 10:00 AM Jackquline Denmark Hayden PHA [facility]   08/05/2023  1:00 PM Melendez-Gonzalez, Alycia Patten, MD [facility]   08/26/2023  2:30 PM Audie Box, MD UN[facility]ULSPCLET [facility]   09/19/2023 11:40 AM Malvin Johns, DO [facility]   10/28/2023 10:55 AM Colford, Jackquline Berlin, MD [facility]       Problem List             Diagnosed      Family history of diabetes mellitus       Lumbar nerve root disorder       Rheumatoid arthritis involving multiple sites with positive rheumatoid factor       Chronic airway obstruction       Type 2 diabetes       Heart failure       Subcapital fracture of neck of right femur       Lesion of left native kidney        Allergies:   Allergies   Allergen Reactions    Doxycycline Hcl Hives    Shellfish Containing Products Swelling and Other (See Comments)     Swelling of lips, no swelling of tongue or difficulty breathing.    Swelling of lips, no swelling of tongue or difficulty breathing.UnknownUnknownSwelling of lips, no swelling of tongue or difficulty breathing. Unknown    Swelling of lips, no swelling of tongue or difficulty breathing.   Unknown   Unknown   Swelling of lips, no swelling of tongue or difficulty breathing.    Sulfa (Sulfonamide Antibiotics) Other (See Comments) and Nausea Only     Stomach issue       Medications:  Prior to Admission medications    Medication Dose, Route, Frequency   acetaminophen (TYLENOL) 325 MG tablet 650 mg, Oral, 2 times a day PRN   ADALIMUMAB PEN CITRATE FREE 40 MG/0.4 ML Inject the contents of 1 pen (40 mg total) under the skin every fourteen (14) days.   albuterol HFA 90 mcg/actuation inhaler 2 puffs, Inhalation, Every 6 hours PRN   ascorbic acid (VITAMIN C ORAL) 1 tablet, Oral, Daily (standard)   atorvastatin (LIPITOR) 40 MG tablet 40 mg, Oral, Daily (standard)   cholecalciferol, vitamin D3-50 mcg, 2,000 unit,, 50 mcg (2,000 unit) tablet 50 mcg, Oral, Daily (standard), 2 tablets daily  fluticasone propion-salmeterol (ADVAIR HFA) 230-21 mcg/actuation inhaler INHALE 2 PUFFS TWO TIMES A DAY   folic acid (FOLVITE) 1 MG tablet 1,000 mcg, Oral, Daily (standard)   furosemide (LASIX) 40 MG tablet 40 mg, Oral, Daily (standard), Take one tablet daily as needed for weight gain (goal weight ~248-251lbs)  Patient taking differently: Take 0.5 tablets (20 mg total) by mouth daily. Take one tablet daily as needed for edema. Do not administer if SBP<100.   inhalational spacing device (AEROCHAMBER MV) Spcr 1 each, Miscellaneous, Daily (standard)   magnesium oxide (MAG-OX) 400 mg (241.3 mg elemental magnesium) tablet 400 mg, Oral, Daily (standard)   methotrexate 2.5 MG tablet 20 mg, Oral, Weekly   multivitamin (TAB-A-VITE/THERAGRAN) per tablet 1 tablet, Oral, Daily (standard)   omeprazole (PRILOSEC) 20 MG capsule TAKE 1 CAPSULE BY MOUTH EVERY DAY IN THE MORNING   potassium chloride 20 MEQ ER tablet 20 mEq, Oral, Daily (standard)   tiotropium bromide (SPIRIVA RESPIMAT) 2.5 mcg/actuation inhalation mist 5 mcg, Inhalation, Daily (standard)   umeclidinium-vilanteroL (ANORO ELLIPTA) 62.5-25 mcg/actuation inhaler Inhale 1 puff BY MOUTH daily. For benefit inquiry only. Please send Epic chat message to Sejal K with coverage information, then discontinue Rx.          Jackquline Denmark, RN Care Manager  07/22/2023

## 2023-07-22 NOTE — Unmapped (Addendum)
Acetaminophen 1000 mg twice a day is safe  Check labs for monitoring of medication  Incorporate your home exercises into your home.   Check labs today      [facility]  [address]  [place], [place] [address]  Phone number: [phone]  Fax: [phone]    [facility] clinic:   [address]  [place], [place] [address]  Phone number [phone]    It's after 5:00pm during the week or it's the weekend. How do I get medical attention?  Call the 24/7 Nursing Line [phone] to get nurse advice  Or [facility] walk-in clinic at [address], [address], [place], [place]; [phone]; 7 days a week from 9:00AM - 8:00PM or  [facility] Urgent care at [facility]    Use of mychart messaging:  -Please feel free to use mychart to ask follow up questions from your visit.   - Mychart messages are not reviewed at nights or weekends so please do not send a message that needs immediate action. You can call the 24/7 nurse line.   -Please allow 72 hours for a response to your mychart message  -Mychart messages are reviewed and triaged by our nursing staff to help you get the most streamlined care.    -I do not see every mychart message-- if there is something you definitely want for me to see you can ask that in the message but do not delay care waiting for me!

## 2023-07-23 ENCOUNTER — Ambulatory Visit: Admit: 2023-07-23 | Discharge: 2023-07-23 | Payer: PRIVATE HEALTH INSURANCE

## 2023-08-05 ENCOUNTER — Ambulatory Visit: Admit: 2023-08-05 | Discharge: 2023-08-06 | Payer: PRIVATE HEALTH INSURANCE

## 2023-08-05 DIAGNOSIS — D485 Neoplasm of uncertain behavior of skin: Principal | ICD-10-CM

## 2023-08-05 NOTE — Unmapped (Signed)
COMPLEX CASE MANAGEMENT   FOLLOW UP NOTE  Summary:  Complex Case Manager  spoke with patient and verified correct patient using two identifiers today for Complex Case Management follow up. Patient currently resides at Home. Primary concern is getting her CPAP supplies. Next call scheduled for 08/19/23.    Subjective:    Patient/caregiver reported she was doing OK. She was on her way to have a cyst removed and was concerned that she hasn't received CPAP supplies yet. CM called [facility] Specialist on behalf of patient to see what was needed for her to get her supplies. CM spoke with them and they stated her supplies were sent to the warehouse this morning and she should receive order this week.    Objective:     Screenings Completed during visit: None completed at this visit    Barriers to care: DME Needs    Interventions provided: Supportive Listening  and called [facility] Specialist to inquire about CPAP supplies    Progress towards  goal : On Track      Plan:     1. Case Management to: Follow up in 2 weeks    2. Patient/caregiver to: notify PCP/CM of questions/concerns    Discuss at next outreach:DME Follow Up    Care Coordination Note updated in [facility]: Yes    Future Appointments   Date Time Provider Department Center   08/05/2023  1:00 PM Melendez-Gonzalez, Alycia Patten, MD [facility]   08/26/2023  2:30 PM Audie Box, MD [facility]   09/18/2023 12:30 PM [facility] MRI RM 2 [facility] [place] [facility]   09/19/2023 11:40 AM Malvin Johns, DO [facility]   10/28/2023 10:55 AM Colford, Jackquline Berlin, MD [facility]       Problem List             Diagnosed      Family history of diabetes mellitus       Lumbar nerve root disorder       Rheumatoid arthritis involving multiple sites with positive rheumatoid factor       Chronic airway obstruction       Type 2 diabetes       Heart failure       Subcapital fracture of neck of right femur       Lesion of left native kidney        Allergies:   Allergies   Allergen Reactions    Doxycycline Hcl Hives    Shellfish Containing Products Swelling and Other (See Comments)     Swelling of lips, no swelling of tongue or difficulty breathing.    Swelling of lips, no swelling of tongue or difficulty breathing.UnknownUnknownSwelling of lips, no swelling of tongue or difficulty breathing. Unknown    Swelling of lips, no swelling of tongue or difficulty breathing.   Unknown   Unknown   Swelling of lips, no swelling of tongue or difficulty breathing.    Sulfa (Sulfonamide Antibiotics) Other (See Comments) and Nausea Only     Stomach issue       Medications:  Prior to Admission medications    Medication Dose, Route, Frequency   acetaminophen (TYLENOL) 325 MG tablet 650 mg, Oral, 2 times a day PRN   ADALIMUMAB PEN CITRATE FREE 40 MG/0.4 ML Inject the contents of 1 pen (40 mg total) under the skin every fourteen (14) days.   albuterol HFA 90 mcg/actuation inhaler 2 puffs, Inhalation, Every 6 hours PRN  ascorbic acid (VITAMIN C ORAL) 1 tablet, Oral, Daily (standard)   atorvastatin (LIPITOR) 40 MG tablet 40 mg, Oral, Daily (standard)   cholecalciferol, vitamin D3-50 mcg, 2,000 unit,, 50 mcg (2,000 unit) tablet 50 mcg, Oral, Daily (standard), 2 tablets daily   fluticasone propion-salmeterol (ADVAIR HFA) 230-21 mcg/actuation inhaler INHALE 2 PUFFS TWO TIMES A DAY   folic acid (FOLVITE) 1 MG tablet 1,000 mcg, Oral, Daily (standard)   furosemide (LASIX) 40 MG tablet 40 mg, Oral, Daily (standard), Take one tablet daily as needed for weight gain (goal weight ~248-251lbs)  Patient taking differently: Take 0.5 tablets (20 mg total) by mouth daily. Take one tablet daily as needed for edema. Do not administer if SBP<100.   inhalational spacing device (AEROCHAMBER MV) Spcr 1 each, Miscellaneous, Daily (standard)   magnesium oxide (MAG-OX) 400 mg (241.3 mg elemental magnesium) tablet 400 mg, Oral, Daily (standard)   methotrexate 2.5 MG tablet 20 mg, Oral, Weekly   multivitamin (TAB-A-VITE/THERAGRAN) per tablet 1 tablet, Oral, Daily (standard)   omeprazole (PRILOSEC) 20 MG capsule TAKE 1 CAPSULE BY MOUTH EVERY DAY IN THE MORNING   potassium chloride 20 MEQ ER tablet 20 mEq, Oral, Daily (standard)   tiotropium bromide (SPIRIVA RESPIMAT) 2.5 mcg/actuation inhalation mist 5 mcg, Inhalation, Daily (standard)   umeclidinium-vilanteroL (ANORO ELLIPTA) 62.5-25 mcg/actuation inhaler Inhale 1 puff BY MOUTH daily. For benefit inquiry only. Please send Epic chat message to Sejal K with coverage information, then discontinue Rx.          Jackquline Denmark, RN Care Manager  08/05/2023

## 2023-08-05 NOTE — Unmapped (Signed)
PROCEDURE NOTE:  Excision    INDICATION: Treatment/removal of Neoplasm of unspecified behavior    Location: right dorsal proximal forearm (below elbow)    Preoperative Pathology Report:  None    Last visit Photo:         Time out taken: Patient Verification with name, medical record number, and date of birth performed.   Site and procedure verified with patient/physician agreement and clinical photograph.   2 Health Care Workers involved in verification.   Consent form:.  Implantable device: no             Allergies to Anesthetics: no  Intolerance to Epinephrine: no       Anticoagulants: no   Major Medical Problems: type II DM, tobacco use disorder, COPD, Rheumatoid Arthritis, CHF  Time: 1: 35 PM    Personnel present during procedure/verification: Erasmo Leventhal, MD Inis Sizer, MD   Operating surgeon (s): Erasmo Leventhal, MD      The size and nature of the excision as well as typical scarring were discussed along with, among others, the risks of bleeding, infection, cutaneous dysesthesia, positive margins, and lesion recurrence.  Written consent obtained/form signed.    EXCISION  Anesthesia: lidocaine 1% with epinephrine was infiltrated at the site with a total of 6 ml injected.   The surgical site was marked, cleansed with chlorhexidine x 2, and draped in a sterile fashion.      Measurements:  Tumor diameter: 17 mm     Total excision (tumor + margins) size:  17 mm.    Site was excised to the subcutaneous fat.   Undermining was performed, and hemostasis was accomplished with electrocautery and pressure.     CLOSURE  Linear layered closure performed to close potential space with:  Buried vertical mattress sutures:   4-0 Monocryl     Superficial suture:   Simple running  5-0 Plain gut    Length of closure: 45 mm    ANTIBIOTICS: None     DRESSING  A dry pressure dressing was applied to the wound site over petrolatum.   Wound healing expectations and the specifics of wound cleansing and bandaging were discussed in detail. The patient was given a handout reiterating instructions.     Suture removal plan: N/A - all dissolvable suture    RTC: No follow-ups on file.

## 2023-08-05 NOTE — Unmapped (Addendum)
[facility] releases most results to you as soon as they are available. Therefore, you may see some results before we do. Please give [place] 3 business days to review the tests and contact you by phone or through MyChart. If you are concerned that some results may be upsetting or confusing, you may wish to wait until we contact you before looking at the report in MyChart.   If you have an urgent question, you can call our clinic. MyChart should not be used for urgent issues. Otherwise, we prefer that you wait 3 business days for [place] to contact you.    [facility] Dermatology Clinical Team    Managing your wound after skin surgery    Please avoid strenuous activity for 48 hours and all heavy exercise for 1 week  If your wound is on your arm, leg, or shoulder, then avoid heavy lifting, straining, or exercise with that limb for at least one week.  If your wound is on the head or neck, avoid bending over if at all possible.  Sleep with your head elevated for 48 hours to reduce the risk of bleeding.  Please don???t smoke for 3 weeks; smoking prevents healthy wound healing  Pain management: Use  600 mg of acetaminophen (Tylenol ) every 4 hours, or medication we prescribed as needed. Pain should decrease steadily for the 1st few days after your surgery.   Possible complications:  Bleeding: a small amount of blood on the bandage is normal.   If there is a significant bleeding into the bandage or beneath the stitches (this will look like swelling and purple discoloration), apply firm pressure  with a cloth or bandage for 20 minutes without interruption. Repeat if bleeding continues. If it persists, please call [phone]. During nonoffice hours use the message of this number to contact the on-call dermatologist or go to the emergency room.  Infection: usually indicated by pain that increases 4 to 6 days after surgery. Mild redness, swelling, soreness are normal, but, with increasing redness, pain, drainage, and swelling, you should contact our office.  Decreased sensation, increased sensitivity, and itching may last for 18 months.  Scarring: scars mature for one year after surgery. Reducing motion and tension over the wound for the 1st month, as well as using sun protection, reduces scarring.  Bruising is normal around the wound and resolves over 2 to 3 weeks.  For wounds with dissolving stitches:  Please keep the original bandage in place for at least 48 hours. You can remove the large outer layer at that time and keep the small bandage clean and dry for one week or until you return. If the  bandage becomes soiled, wet, or detached, you can reinforce it with tape or add petrolatum  (Vaseline) to the stitches, then place a fresh, clean, nonstick bandage (telfa) with tape or use a non stick Band-Aid.  For wounds with stitches that need to be removed:  Please keep the original bandage in place for at least 48 hours. You can remove the large outer bandage at that time. You may leave the smaller bandage in place until it falls off by itself, or change it after it gets wet with bathing. To change, apply a layer of petrolatum (Vaseline) over top of the Steri-Strips (thin white stripes) and cover with a nonstick stick bandage (telfa) and  paper tape or use a non stick Band-Aid.  If you???re changing the bandage, you can gently remove any crust with warm water and mild soap using  a soft cloth a Q-tip. Do not use triple antibiotic ointment, Neosporin, or hydrogen peroxide.  After the stitches and bandages have been removed: clean daily with water with warm water and gentle soap using a soft cloth. You can continue to use a small bandage and sunscreen for one to 2 months or until fully healed.    If instructed to use vinegar soaks, use a fresh bottle of white vinegar and bottled water to make the soak  Please add 1 cup of white vinegar to a quart of room temperature water.  Soak affected area for 10-20 minutes once or twice a day.

## 2023-08-06 NOTE — Unmapped (Signed)
[facility] Refill Coordination Note    Specialty Medication(s) to be Shipped:   Inflammatory Disorders: Humira    Other medication(s) to be shipped: No additional medications requested for fill at this time     Cynthia Ramirez, DOB: 08-18-1961  Phone: [phone] (home)       All above HIPAA information was verified with patient.     Was a Nurse, learning disability used for this call? No    Completed refill call assessment today to schedule patient's medication shipment from the [facility] [phone]).  All relevant notes have been reviewed.     Specialty medication(s) and dose(s) confirmed: Regimen is correct and unchanged.   Changes to medications: Cynthia Ramirez reports no changes at this time.  Changes to insurance: No  New side effects reported not previously addressed with a pharmacist or physician: None reported  Questions for the pharmacist: No    Confirmed patient received a Conservation officer, historic buildings and a Surveyor, mining with first shipment. The patient will receive a drug information handout for each medication shipped and additional FDA Medication Guides as required.       DISEASE/MEDICATION-SPECIFIC INFORMATION        For patients on injectable medications: Patient currently has 0 doses left.  Next injection is scheduled for 08/12/23.    SPECIALTY MEDICATION ADHERENCE     Medication Adherence    Patient reported X missed doses in the last month: 0  Specialty Medication: HUMIRA(CF) PEN 40 mg/0.4 mL injection (adalimumab)  Patient is on additional specialty medications: No              Were doses missed due to medication being on hold? No        REFERRAL TO PHARMACIST     Referral to the pharmacist: Not needed      [facility]     Shipping address confirmed in Epic.       Delivery Scheduled: Yes, Expected medication delivery date: 08/09/23.     Medication will be delivered via Same Day Courier to the prescription address in Epic [place].    Cynthia Ramirez   [facility] Specialty Technician

## 2023-08-09 MED FILL — HUMIRA PEN CITRATE FREE 40 MG/0.4 ML: SUBCUTANEOUS | 28 days supply | Qty: 2 | Fill #8

## 2023-08-09 NOTE — Unmapped (Signed)
COMPLEX CASE MANAGEMENT   Brief Note    CM returned patients call. She had questions about the order the PCP placed today for a walker. Patient has number to follow up with [facility] Specialist.        Adrian Prince - Case Manager   [facility]  [address],  [place], [place] [address]  p. [phone]   [email or web address]

## 2023-08-09 NOTE — Unmapped (Signed)
Addended by: Gabrielle Dare on: 08/09/2023 01:53 PM     Modules accepted: Orders

## 2023-08-09 NOTE — Unmapped (Signed)
COMPLEX CASE MANAGEMENT   Brief Note    CMA received incoming call for Cynthia Ramirez, CM. CMA sent Sue Lush an in-basket message to call patient.    Beulah Gandy Risk Care Coordinator  [facility]  [address]  [place], [place] [address]  She/Her/Hers  P: [phone] F: [phone]  [email or web address]

## 2023-08-13 NOTE — Unmapped (Signed)
Dr. Berton Lan signed PT form. MA faxed to physical therapy and received fax confirmation. Saved to media.

## 2023-08-13 NOTE — Unmapped (Signed)
Addended by: Gabrielle Dare on: 08/13/2023 08:22 AM     Modules accepted: Orders

## 2023-08-15 NOTE — Unmapped (Signed)
[facility] Medicine   CARE MANAGEMENT ENCOUNTER           Date of Service:  08/15/2023      Service:  Care Coordination - mychart  Is there someone else in the room? No.   MyChart use by patient is active: yes    Post-outreach Action Items:  Provider: N/A.  CM:  will send order to DME company of pt's choosing .  Patient:  pt to call around to DME companies to see if they have in stock a rollator with all terrain wheels that [employer] will cover. Marland Kitchen    Purpose of contact:     Care Manager (CM) received request for - pt with rollator order but unable to obtain one from [facility] with all terrain larger wheels .     Care Manager (CM) completed the following related to the above request:  Reviewed chart  Order for walker with seat in chart for 8/23 and 8/27 - 08/13/23 order states needs larger tires    Pt states [facility] advised they did not have rollators with larger wheels and pt would need to purchase the wheels and have them installed on her walker   Identified resources   Pt with [employer]  and discussed the following    Advised pt that insurance covers rollators and standard walker at same rate. Any upgrade to all terrain wheels will likely be an out of pocket expense is CM concern. Advised pt to call around and see if she can find the rollator she prefers and if they will take her insurance and we can send the script.       Patient was encouraged to reach out to their provider with any questions or concerns.      A copy of this Patient Outreach Encounter was sent to patient's Primary Care Provider

## 2023-08-19 ENCOUNTER — Ambulatory Visit: Admit: 2023-08-19

## 2023-08-22 NOTE — Unmapped (Signed)
Addended by: Gabrielle Dare on: 08/22/2023 01:07 PM     Modules accepted: Orders

## 2023-08-26 ENCOUNTER — Ambulatory Visit
Admit: 2023-08-26 | Discharge: 2023-08-27 | Payer: PRIVATE HEALTH INSURANCE | Attending: Student in an Organized Health Care Education/Training Program | Primary: Student in an Organized Health Care Education/Training Program

## 2023-08-26 DIAGNOSIS — J449 Chronic obstructive pulmonary disease, unspecified: Principal | ICD-10-CM

## 2023-08-26 NOTE — Unmapped (Signed)
It was good to see you in clinic today!    You should:   - continue to take the Advair and Spiriva, and the albuterol as needed   - we will schedule your CT lung cancer screening next Feb    We will also schedule you for repeat 6 minute walk test in [place].

## 2023-08-26 NOTE — Unmapped (Signed)
[facility]  Pulmonary Clinic - Follow-up Visit    Referring Physician :  Audie Box  PCP:     Colford, Jackquline Berlin, MD  Reason for Consult:   Followup COPD      ASSESSMENT and PLAN     Cynthia Ramirez is a 62 y.o. female with obesity, COPD on 1-2L, former tobacco use (1ppd, >30 years, quit in Feb 2023), HFpEF, RA on methotrexate, OSA on BiPAP, knee osteoarthritis whom we are seeing in for evaluation of hypoxic respiratory failure, now improving and she has been off O2.     Doing well on current regimen: Spiriva and Advair. Rarely uses her rescue inhaler. Has been able to wean off of O2, sats >89% even with exertion at home. Dyspnea also improving. She is starting aquatic therapy soon. She uses Lasix PRN for weight gain, Last TTE showed diastolic dysfunction, normal RV.     Plan today:   - repeat , if all normal can DC home oxygen  - followup in 6 months for COPD monitoring and repeat FVLs   - repeat LDCT in Feb 2025 (ordered)  - cont good CPAP compliance  - immunosuppression per rheum, no e/o ILD on Chest CT. Will CTM her PFTs    Cynthia Ramirez was discussed with Dr. Driscilla Grammes who agrees with the assessment and plan above.     DG:UYQIH C Ghadeer Kastelic, Colford, Jackquline Berlin, MD    Alesia Banda, MD  Pulmonary & Critical Care Fellow    HISTORY:      Active Pulmonary Problems & Brief History:  Cynthia Ramirez is a 62 y.o. female with a history of obesity, COPD, tobacco use (1ppd, >30 years, quit in Feb 2023), OSA, HFpEF, RA on methotrexate/humira, knee osteoarthritis whom we are following for COPD and OSA.     Brief history by problem:  #COPD, FEV1 75% predicted   - last PFT 04/2023: FEV 75%, DLCO moderately reduced 57%  - last exacerbation - hospitalized 01/20/22-02/07/22 hypoxic respiratory failure 2/2 viral pneumonia and RV failure  - current regiment -  advair BID + spiriva + albuterol PRN - has never used albuterol inhaler  - labs  - A1AT - negative  - eos - 200 (highest 300 historically)  - IgE - 105  - ANCA - defer for now, low suspicion  - imaging: Feb 2024 stable repeat Feb 2025  - smoking: 30 + pack years, stopped since discharge in Feb 2023  - lung cancer screening - needs f/u in Feb 2025  - vaccination (pneumococcal, flu, COVID): due for Flu, Covid (will get via PCP)     #HFpEF  - Last TTE 2024: LVEF 55%, Grade I DD.   - on Lasix PRN     #GERD symptoms  - no issues on PPI (prilosec)     #OSA symptoms: currently on CPAP 13 with 2L  - last sleep study rec:  Severe obstructive sleep apnea with an overall AHI = 76.  The respiratory events were associated with oxygen desaturation to a low of 74% and severe fragmentation of sleep architecture.  In the PAP titration portion of the study, CPAP was titrated up to 13 cm H2O in the supine position in REM sleep with improvement in obstructive events.   Hypoxemia. Patient required 2 lpm supplemental oxygen to keep saturations in the low-mid 90's.    #Sinus symptoms  - well controlled on saline rinse     Interval events:  -  off O2, lowest sats 88%  - taking Advair and Spiriva, as albuterol (never needed it)  - no recent steroids or exacerbation   - takes lasix (when gains 2lbs in 24 hours) - has not had to use recently   - starting aquatic therapy     Immunization History   Administered Date(s) Administered Comments    COVID-19 VAC,BIVALENT(79YR UP),PFIZER 10/10/2021      04/11/2022     COVID-19 VACC,MRNA,(PFIZER)(PF) 03/11/2020 Per [facility]     04/01/2020 Per [facility]     09/22/2020     Covid-19 Vac, (48yr+) (Spikevax) Monovalent Xbb.1.5 Moder  10/23/2022     Influenza Vaccine Quad(IM)6 MO-Adult(PF) 09/11/2018      09/05/2019      09/29/2020      10/10/2021      10/03/2022     Influenza Virus Vaccine, unspecified formulation 03/11/2017      09/16/2019 [facility]    PNEUMOCOCCAL POLYSACCHARIDE 23-VALENT 09/29/2020     Pneumococcal Conjugate 13-Valent 05/17/2016     RSV VACCINE, BIVALENT (PF) (ABRYSVO) 11/14/2022     TdaP 09/20/2008 [facility] records and referral documentation reviewed.     Review of Systems:  A comprehensive review of systems was completed and negative except as noted in HPI.    PMHx:  - COPD  - RA  - HFpEF    PSHx:  Procedure Laterality Date    BREAST BIOPSY Left 02/2016    HYSTERECTOMY         TAH before age 17    INNER EAR SURGERY Bilateral       2004 and earlier -- unspecified nature of surgery    PILONIDAL CYST DRAINAGE        PR COLONOSCOPY FLX DX W/COLLJ SPEC WHEN PFRMD N/A 03/13/2016     Procedure: COLONOSCOPY, FLEXIBLE, PROXIMAL TO SPLENIC FLEXURE; DIAGNOSTIC, W/WO COLLECTION SPECIMEN BY BRUSH OR WASH;  Surgeon: Liane Comber, MD;  Location: [facility] PROCEDURES [facility];  Service: Gastroenterology    PR FEMORAL FX, OPEN TX Right 07/19/2022     Procedure: HEMI OPEN TREATMENT OF FEMORAL FRACTURE, PROXIMAL END, NECK, INTERNAL FIXATION OR PROSTHETIC REPLACEMENT - modifier 22;  Surgeon: Valda Favia, MD;  Location: MAIN OR [facility];  Service: Orthopedics    right hip surgery Right 07/19/2022    TONSILLECTOMY         FHx:  Reviewed    Social   Smoking (cigarettes, vaping, marijuana): Quit Feb 2023, 30 pack year smoking hx    Relevant Medications:   -advair  -spiriva  -albuterol  -MTX    Allergies: Reviewed.    Other History:  The past medical history, surgical history, social history, family history, medications and allergies were personally reviewed and updated in the patient's electronic medical record. Pertinent items are noted above.       PHYSICAL EXAM:      Vitals:    08/26/23 1443   BP: 149/77   Pulse: 71   SpO2: 93%     General: Alert, NAD, appears well nourished  Eyes: sclera clear, anicteric, conjunctiva without erythema/injection  HEENT: nares patent  Heart: S1, S2 normal, RRR, 1-2+ pitting edema bilaterally  Lungs: normal WOB on RA, CTAB, no focal crackles or wheezes   MSK: 2+ LE edema  Skin: WWP, No rashes, wounds, lesions on clothed exam  Neuro: Alert, oriented, follows commands. Moving all extremities spontaneously  Psych: No agitation, anxiety, or depression    LABORATORY and RADIOLOGY DATA:     Pulmonary Function  Tests:  04/2023:  The expiratory flow volume curve shows mild obstructive ventilatory defect.  DLCO is moderately reduced. Inspiratory limb of flow-volume loop demonstrates normal forces inspiratory flows.  Improved since prior test on 08/07/2022     Pulmonary Function Testing:        Pertinent Imaging Data:  Images personally reviewed and discussed with attending     TTE 04/2023:  Summary    1. The left ventricle is mildly dilated in size with normal wall thickness.    2. The left ventricular systolic function is normal, LVEF is visually  estimated at > 55%.    3. There is grade I diastolic dysfunction (impaired relaxation).    4. Mitral annular calcification is present.    5. The aortic valve is trileaflet with mildly thickened leaflets with normal  excursion.    6. The left atrium is mildly dilated in size.    7. The right ventricle is normal in size, with normal systolic function.    8. There is no evidence of an interatrial flow communication or  intrapulmonary shunt by agitated saline study.    9. IVC size and inspiratory change suggest mildly elevated right atrial  pressure. (5-10 mmHg).    CT (LDCT) 01/2023:  IMPRESSION:  --Middle lobe micronodule.  --Enlarged main pulmonary artery measures up to 3.7 cm, nonspecific, which may be seen in setting of pulmonary hypertension.  --1.8 cm intermediate attenuation right upper pole renal lesion is indeterminate, high attenuation cyst versus renal cortical neoplasm.  --Stable bilateral axillary lymph nodes measuring up to 1.4 cm.  --Mild three-vessel coronary calcifications.  -- Asymmetrically prominent left breast soft tissue with punctate calcification.

## 2023-08-31 NOTE — Unmapped (Signed)
Attending Attestation     I saw and evaluated the patient, participating in the key portions of the service.  I reviewed the resident???s note.  I agree with the resident???s findings and plan.         Harrel Carina, MD  Pulmonary/Critical Care Medicine  Pediatric Pulmonology    08/31/23

## 2023-09-02 NOTE — Unmapped (Signed)
[facility] Special[facility] Home Delivery Pharmacy Refill Coordination Note    Specialty Medication(s) to be Shipped:   Inflammatory Disorders: Humira    Other medication(s) to be shipped: No additional medications requested for fill at this time     Cynthia Ramirez, DOB: January 10, 1961  Phone: [phone] (home)       All above HIPAA information was verified with patient.     Was a Nurse, learning disability used for this call? No    Completed refill call assessment today to schedule patient's medication shipment from the [facility]  [phone]).  All relevant notes have been reviewed.     Specialty medication(s) and dose(s) confirmed: Regimen is correct and unchanged.   Changes to medications: [name] reports no changes at this time.  Changes to insurance: No  New side effects reported not previously addressed with a pharmacist or physician: None reported  Questions for the pharmacist: No    Confirmed patient received a Conservation officer, historic buildings and a Surveyor, mining with first shipment. The patient will receive a drug information handout for each medication shipped and additional FDA Medication Guides as required.       DISEASE/MEDICATION-SPECIFIC INFORMATION        For patients on injectable medications: Patient currently has 1 doses left.  Next injection is scheduled for next week.    SPECIALTY MEDICATION ADHERENCE     Medication Adherence    Patient reported X missed doses in the last month: 0  Specialty Medication: HUMIRA(CF) PEN 40 mg/0.4 mL  Patient is on additional specialty medications: No              Were doses missed due to medication being on hold? No    Humira 40/0.4 mg/ml: 1 doses of medicine on hand        REFERRAL TO PHARMACIST     Referral to the pharmacist: Not needed      [facility]     Shipping address confirmed in Epic.       Delivery Scheduled: Yes, Expected medication delivery date: 09/05/23.     Medication will be delivered via Same Day Courier to the prescription address in Epic [place].    Willette Pa   [facility] Delivery Pharm[facility]  Specialty Technician

## 2023-09-05 MED FILL — HUMIRA PEN CITRATE FREE 40 MG/0.4 ML: SUBCUTANEOUS | 28 days supply | Qty: 2 | Fill #9

## 2023-09-10 ENCOUNTER — Ambulatory Visit: Admit: 2023-09-10 | Discharge: 2023-09-11 | Payer: PRIVATE HEALTH INSURANCE

## 2023-09-12 NOTE — Unmapped (Signed)
Signed discharge report faxed back to [facility], fax confirmation received

## 2023-09-17 ENCOUNTER — Ambulatory Visit: Admit: 2023-09-17 | Discharge: 2023-09-18 | Payer: PRIVATE HEALTH INSURANCE

## 2023-09-17 DIAGNOSIS — I5032 Chronic diastolic (congestive) heart failure: Principal | ICD-10-CM

## 2023-09-17 LAB — CBC W/ AUTO DIFF
BASOPHILS ABSOLUTE COUNT: 0.1 10*9/L (ref 0.0–0.1)
BASOPHILS RELATIVE PERCENT: 0.7 %
EOSINOPHILS ABSOLUTE COUNT: 0.1 10*9/L (ref 0.0–0.5)
EOSINOPHILS RELATIVE PERCENT: 1.8 %
HEMATOCRIT: 37.7 % (ref 34.0–44.0)
HEMOGLOBIN: 11.9 g/dL (ref 11.3–14.9)
LYMPHOCYTES ABSOLUTE COUNT: 1.9 10*9/L (ref 1.1–3.6)
LYMPHOCYTES RELATIVE PERCENT: 23.1 %
MEAN CORPUSCULAR HEMOGLOBIN CONC: 31.6 g/dL — ABNORMAL LOW (ref 32.0–36.0)
MEAN CORPUSCULAR HEMOGLOBIN: 29.1 pg (ref 25.9–32.4)
MEAN CORPUSCULAR VOLUME: 92.1 fL (ref 77.6–95.7)
MEAN PLATELET VOLUME: 8.4 fL (ref 6.8–10.7)
MONOCYTES ABSOLUTE COUNT: 0.8 10*9/L (ref 0.3–0.8)
MONOCYTES RELATIVE PERCENT: 9.7 %
NEUTROPHILS ABSOLUTE COUNT: 5.2 10*9/L (ref 1.8–7.8)
NEUTROPHILS RELATIVE PERCENT: 64.7 %
PLATELET COUNT: 283 10*9/L (ref 150–450)
RED BLOOD CELL COUNT: 4.1 10*12/L (ref 3.95–5.13)
RED CELL DISTRIBUTION WIDTH: 18.8 % — ABNORMAL HIGH (ref 12.2–15.2)
WBC ADJUSTED: 8.1 10*9/L (ref 3.6–11.2)

## 2023-09-17 LAB — BASIC METABOLIC PANEL
ANION GAP: 7 mmol/L (ref 5–14)
BLOOD UREA NITROGEN: 16 mg/dL (ref 9–23)
BUN / CREAT RATIO: 31
CALCIUM: 9.3 mg/dL (ref 8.7–10.4)
CHLORIDE: 107 mmol/L (ref 98–107)
CO2: 29.4 mmol/L (ref 20.0–31.0)
CREATININE: 0.51 mg/dL — ABNORMAL LOW
EGFR CKD-EPI (2021) FEMALE: >90 mL/min/{1.73_m2} (ref >=60)
GLUCOSE RANDOM: 92 mg/dL (ref 70–179)
POTASSIUM: 3.8 mmol/L (ref 3.4–4.8)
SODIUM: 143 mmol/L (ref 135–145)

## 2023-09-17 LAB — AST: AST (SGOT): 25 U/L (ref <=34)

## 2023-09-17 LAB — ALT: ALT (SGPT): 17 U/L (ref 10–49)

## 2023-09-18 ENCOUNTER — Ambulatory Visit: Admit: 2023-09-18 | Discharge: 2023-09-18 | Payer: PRIVATE HEALTH INSURANCE

## 2023-09-18 MED ADMIN — gadopiclenol injection 8 mL: 8 mL | INTRAVENOUS | @ 17:00:00 | Stop: 2023-09-18

## 2023-09-19 ENCOUNTER — Ambulatory Visit
Admit: 2023-09-19 | Discharge: 2023-09-19 | Payer: PRIVATE HEALTH INSURANCE | Attending: Internal Medicine | Primary: Internal Medicine

## 2023-09-19 ENCOUNTER — Ambulatory Visit: Admit: 2023-09-19 | Discharge: 2023-09-19 | Payer: PRIVATE HEALTH INSURANCE

## 2023-09-19 DIAGNOSIS — M0579 Rheumatoid arthritis with rheumatoid factor of multiple sites without organ or systems involvement: Principal | ICD-10-CM

## 2023-09-19 DIAGNOSIS — M1711 Unilateral primary osteoarthritis, right knee: Principal | ICD-10-CM

## 2023-09-19 MED ORDER — HUMIRA PEN CITRATE FREE 40 MG/0.4 ML
SUBCUTANEOUS | 3 refills | 84 days | Status: CP
Start: 2023-09-19 — End: ?

## 2023-09-19 NOTE — Unmapped (Unsigned)
RHEUMATOLOGY OUTPATIENT FOLLOWUP VISIT    Assessment/Plan:  Cynthia Ramirez is a 62 y.o. 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 Taking methotrexate and Humira. We discussed that her current joint exam is more c/w OA. Will continue with the current RA medications and check monitoring labs. Will refer to PT for right knee OA and Dr. Michaell Cowing for b/l ankle valgus deformity.     Seropositive rheumatoid arthritis  - continue methotrexate  20 mg once per week  - continue folic acid 1 mg daily  - Update monitoring labs today: CBCw/diff, CMP  - Continue Humira (or biosilimar) 40 mg every other week.    2. Right knee OA  - referral to [facility] in [place]    3. Valgus deformity b/l ankle  Referral to Dr. Michaell Cowing in [place]    RTC in 4-5 months overbook    Immunization History   Administered Date(s) Administered Comments    COVID-19 VAC,BIVALENT(100YR UP),PFIZER 10/10/2021      04/11/2022     COVID-19 VACC,MRNA,(PFIZER)(PF) 03/11/2020 Per [facility]     04/01/2020 Per [facility]     09/22/2020     Covid-19 Vac, (63yr+) (Spikevax) Monovalent Moderna 10/23/2022     Influenza Vaccine Quad(IM)6 MO-Adult(PF) 09/11/2018      09/05/2019      09/29/2020      10/10/2021      10/03/2022     Influenza Virus Vaccine, unspecified formulation 03/11/2017      09/16/2019 [facility]    PNEUMOCOCCAL POLYSACCHARIDE 23-VALENT 09/29/2020     Pneumococcal Conjugate 13-Valent 05/17/2016     RSV VACCINE, BIVALENT(PF)(ABRYSVO) 11/14/2022     TdaP 09/20/2008 [facility]       HPI: Cynthia Ramirez is a 62 y.o.  female whom I met via video in March 2021 for a new patient encounter for the evaluation of erosive rheumatoid arthritis RF 130, CCP >250 . Previously followed at [facility] and last seen there in 2018. Previous treatment with methotrexate though she admits not taking it very often. She was not interested in biologics but they were discussed. She does not like to take medications. Her symptoms started after she took a bad fall on the job in 2016. She jammed her left hand. No fractures. She spent a year seeing physicians about the knee injury and eventually had surgery for a torn meniscus. She limped around for a year. She developed sciatica and worked with a Land which helped. She has the sciatic issue again which she thinks may be from coughing from her COPD.The top of her foot has a bony protuberance. She went to a podiatrist who gave her inserts which did not help. Her left 3rd PIP joint remains swollen. I checked xrays of her hands and feet and she has no erosive disease. She is wary of taking medications and given the fact that she has no erosions, we decided at last visit to try HCQ 400 mg daily. CRP was 36, ESR normal.  Tried arava but it did not help.  She was admitted in February for volume overload. Acute on chronic hypoxic respiratory failure 2/2 probable viral pneumonia and HFpEF. During her hospitalization she spent all of her time in the ICU in bed. During that time her joint pain got extremely bad. Now involves her shoulders, knees in addition to wrists and ankles. She developed acute left knee pain about 10 days ago. No trauma. She tried elevating her legs, used compression socks. It is better now. Pulmonary rehab has  been prescribed but she cannot walk. She is taking aleve alternating with tylenol. Aleve 2 every 12 hours. Having a very difficult time getting up out of a chair due to both weakness and pain.   Xray of the left knee shows Tricompartmental joint space narrowing osteophyte formation most proximal in the medial and lateral compartments. Moderate size joint effusion without layering fat fluid. Soft tissues are within normal limits.      Interim History: Last seen 05/09/2023. Seen by PT 05/20/2023 for R foot OA. Underwent CPAP pressure titration 07/23/2023 due to several ongoing apneic events.    ***     in January.she is frustrated with her joint pain. Having a difficult time walking.    Right knee: reviewed Xray. This shows tricompartmental OA. Requested referral to [facility] PT in [place].  B/l ankle pain: on exam she has valgus deformity of both ankles. Arches look like they are collapsing.  Her gait is very stiff. Has difficulty flexing at the knees and ankles.  Wants to stop oxygen. Getting close. Using 1 liter.  Has been referred to Pulmonary rehab but never got a chance to start.  Having no difficulty with methotrexate or humira.  Having more bony changes in the right 3rd PIP and left 2nf PIP. Can not bend these fingers.     MRI of the abdomen:   1.1 cm indeterminate left renal lesion, as above, incompletely characterized on single phase CT and new compared with 2018. Recommend short interval follow-up with MRI of the abdomen with and without contrast to evaluate for interval stability and characterization.      Subacute/chronic splenic infarct, new compared with 02/11/2017.      Hepatic lesions consistent with cysts/biliary versus biliary hamartomas, not substantially changed compared with 2018.     Allergies:  Doxycycline hcl, Shellfish containing products, and Sulfa (sulfonamide antibiotics)    Problem List  Patient Active Problem List   Diagnosis    Tobacco use disorder    Family history of diabetes mellitus (DM)    Lumbar radiculopathy    Rheumatoid arthritis involving multiple sites with positive rheumatoid factor (CMS-HCC)    COPD (chronic obstructive pulmonary disease) (CMS-HCC)    Diabetes mellitus, type 2 (CMS-HCC)    Chronic diastolic heart failure (CMS-HCC)    Subcapital fracture of neck of right femur (CMS-HCC)    Lesion of left native kidney      Medical History:  Past Medical History:   Diagnosis Date    Abnormal mammogram     At risk for falls     Cognitive impairment     has a hard time remembering    COPD (chronic obstructive pulmonary disease) (CMS-HCC)     COPD (chronic obstructive pulmonary disease) (CMS-HCC) 03/06/2018    Diabetes mellitus (CMS-HCC)     Dizziness     Essential hypertension, benign     Family history of diabetes mellitus (DM)     Financial difficulties     Hearing impairment     has a hole in her eardrum    HL (hearing loss)     Impaired mobility     Lack of access to transportation     Nonadherence to medication     Problem with transportation     Rheumatoid arthritis (CMS-HCC) 2017    Marked elevation of rheumatoid factor 130.5  and CCP>250    Tobacco use disorder     Visual impairment     hard to see per patient  Surgical History:  Past Surgical History:   Procedure Laterality Date    BREAST BIOPSY Left 02/2016    HYSTERECTOMY      TAH before age 35    INNER EAR SURGERY Bilateral     2004 and earlier -- unspecified nature of surgery    PILONIDAL CYST DRAINAGE      PR COLONOSCOPY FLX DX W/COLLJ SPEC WHEN PFRMD N/A 03/13/2016    Procedure: COLONOSCOPY, FLEXIBLE, PROXIMAL TO SPLENIC FLEXURE; DIAGNOSTIC, W/WO COLLECTION SPECIMEN BY BRUSH OR WASH;  Surgeon: Liane Comber, MD;  Location: [facility];  Service: Gastroenterology    PR FEMORAL FX, OPEN TX Right 07/19/2022    Procedure: HEMI OPEN TREATMENT OF FEMORAL FRACTURE, PROXIMAL END, NECK, INTERNAL FIXATION OR PROSTHETIC REPLACEMENT - modifier 22;  Surgeon: Valda Favia, MD;  Location: MAIN OR [facility];  Service: Orthopedics    right hip surgery Right 07/19/2022    TONSILLECTOMY Bilateral 1973 approx     Social History:  Social History     Tobacco Use    Smoking status: Former     Current packs/day: 0.00     Average packs/day: 1 pack/day for 40.0 years (40.0 ttl pk-yrs)     Types: Cigarettes     Start date: 01/19/1982     Quit date: 01/19/2022     Years since quitting: 1.6     Passive exposure: Past    Smokeless tobacco: Never    Tobacco comments:     Pt stated stopped since 01/21/22   Vaping Use    Vaping status: Never Used   Substance Use Topics    Alcohol use: Not Currently     Comment: Occasionally; once a year    Drug use: No   she works at [employer] in [place]     Family History:  Family History   Problem Relation Age of Onset    Cancer Mother     Colon cancer Mother     Heart disease Father     Heart disease Sister     Diabetes Brother     Diabetes Brother     Diabetes Brother     Breast cancer Neg Hx     Melanoma Neg Hx     Basal cell carcinoma Neg Hx     Squamous cell carcinoma Neg Hx      Review of Systems: Please see above in the HPI, the remainder of a 10-system review was unremarkable.    Objective   There were no vitals filed for this visit.      Physical Exam  General:   In no distress, She continues to wear oxygen by nasal cannula.   Eyes:   conjunctivae are clear.   ENT:   MMM.    Skin:    No diffuse rash.   Musculo Skeletal:   Minimal Tenderness to b/l 3rd MCP with swelling.   Right 3rd PIP and left 2nd PIP poor movement, bony changes  Tenderness to L wrist. Good ROM to b/l wrists.   Flexion contracture to L elbow. Good ROM to R.   Good ROM of b/l knees w/o swelling.   Valgus deformity of both ankles, fallen arches b/l

## 2023-09-21 DIAGNOSIS — M0579 Rheumatoid arthritis with rheumatoid factor of multiple sites without organ or systems involvement: Principal | ICD-10-CM

## 2023-09-23 DIAGNOSIS — J449 Chronic obstructive pulmonary disease, unspecified: Principal | ICD-10-CM

## 2023-09-23 NOTE — Unmapped (Signed)
Order for stopped oxygen faxed to [facility] at )[phone]. Fax confirmation received.

## 2023-09-30 DIAGNOSIS — M0579 Rheumatoid arthritis with rheumatoid factor of multiple sites without organ or systems involvement: Principal | ICD-10-CM

## 2023-09-30 MED ORDER — HUMIRA PEN CITRATE FREE 40 MG/0.4 ML
SUBCUTANEOUS | 1 refills | 56 days | Status: CP
Start: 2023-09-30 — End: ?
  Filled 2023-10-09: qty 2, 28d supply, fill #0

## 2023-09-30 NOTE — Unmapped (Signed)
Humira q14d dosing-needed while waiting for q7d PA approval  Last Visit Date: 09/19/2023  Next Visit Date: 02/04/2024    Lab Results   Component Value Date    ALT 17 09/17/2023    AST 25 09/17/2023    ALBUMIN 3.1 (L) 05/09/2023    CREATININE 0.51 (L) 09/17/2023     Lab Results   Component Value Date    WBC 8.1 09/17/2023    HGB 11.9 09/17/2023    HCT 37.7 09/17/2023    PLT 283 09/17/2023     Lab Results   Component Value Date    NEUTROPCT 64.7 09/17/2023    LYMPHOPCT 23.1 09/17/2023    MONOPCT 9.7 09/17/2023    EOSPCT 1.8 09/17/2023    BASOPCT 0.7 09/17/2023   ;

## 2023-09-30 NOTE — Unmapped (Signed)
Patient called to fill weekly dosing of Humira but PA appeal pending. Patient will reach out to Provider to send in RX for bi-weekly while awaiting decision

## 2023-09-30 NOTE — Unmapped (Signed)
Humira q14d dosing pended to Dr Val Eagle

## 2023-10-01 ENCOUNTER — Ambulatory Visit: Admit: 2023-10-01 | Discharge: 2023-10-02 | Payer: PRIVATE HEALTH INSURANCE

## 2023-10-01 DIAGNOSIS — J449 Chronic obstructive pulmonary disease, unspecified: Principal | ICD-10-CM

## 2023-10-01 DIAGNOSIS — E119 Type 2 diabetes mellitus without complications: Principal | ICD-10-CM

## 2023-10-01 DIAGNOSIS — M0579 Rheumatoid arthritis with rheumatoid factor of multiple sites without organ or systems involvement: Principal | ICD-10-CM

## 2023-10-01 DIAGNOSIS — H9190 Unspecified hearing loss, unspecified ear: Principal | ICD-10-CM

## 2023-10-01 LAB — ALBUMIN / CREATININE URINE RATIO
ALBUMIN QUANT URINE: 7.2 mg/dL
ALBUMIN/CREATININE RATIO: 163.3 ug/mg — ABNORMAL HIGH (ref 0.0–30.0)
CREATININE, URINE: 44.1 mg/dL

## 2023-10-01 MED ORDER — METFORMIN 500 MG TABLET
ORAL_TABLET | Freq: Two times a day (BID) | ORAL | 3 refills | 90 days | Status: CP
Start: 2023-10-01 — End: 2024-09-30

## 2023-10-01 MED ORDER — LOSARTAN 50 MG-HYDROCHLOROTHIAZIDE 12.5 MG TABLET
ORAL_TABLET | Freq: Every day | ORAL | 3 refills | 90 days | Status: CP
Start: 2023-10-01 — End: ?

## 2023-10-01 MED ORDER — ALBUTEROL SULFATE HFA 90 MCG/ACTUATION AEROSOL INHALER
Freq: Four times a day (QID) | RESPIRATORY_TRACT | 11 refills | 0 days | Status: CP | PRN
Start: 2023-10-01 — End: ?

## 2023-10-01 NOTE — Unmapped (Signed)
[facility] Internal Medicine (Faculty Practice) Clinic Visit    Reason for Visit: Follow up COPD    A/P:    Assessment & Plan  Chronic obstructive pulmonary disease, unspecified COPD type (CMS-HCC)  Now off oxygen. Cont advair and spiriva. Passed 6 minute walk. Using cpap regularly. CT chest screening in 2/20205         Type 2 diabetes mellitus without complication, without long-term current use of insulin (CMS-HCC)    Controlled. Would benefit from weight loss to help with foot pain. Trial of metformin 500 mg daily.     Orders:    Health Maintenance Outside Records Request; Future    Rheumatoid arthritis involving multiple sites with positive rheumatoid factor (CMS-HCC)  Following closely with Dr. Berton Lan. Working to increase Humira dosing.        Decreased hearing, unspecified laterality  Having increased trouble with  hearing. Referral to audiology.   Orders:    Ambulatory referral to Audiology; Future    Caregiver stress  Increasing stress in caring for her partner with dementia. Not feeling depressed but feeling overwhelmed. Not interested in SSRI       Health maintenence: completed flu and covid shot this fall. Had RSV last year     Return in about 4 weeks (around 10/29/2023), or Laural Benes for bp check in 4 weeks, me in 3 months., for Recheck.    I personally spent 31 minutes face-to-face and non-face-to-face in the care of this patient, which includes all pre, intra, and post visit time on the date of service.    __________________________________________________________    HPI:    62 yo with copd on 1-2L, former smoker, DM,  HFpEF, RA, OSA on cpap and OA.     MRI abd confirmed unchanges benign cysts with no indication for furhter follow up    RA/OA: nodules on elbow tested and consistent with rheumatoid nodule. feet showing progressive OA but stable ankles. Continues the weekly humira. Recommended good supportive shoes. Using a walker for pain.     COPD: had 6 minute walk and does not need oxygen.   Is using cpap regularly for osa    Obesity with complications of DM, HTN, and OA: difficult with moving due to arthritis and weight. Tries to eat healthy but hard to cook at home.      DM: has been  well controlled without medications.    Lab Results   Component Value Date    A1C 6.2 (H) 02/26/2023       HTN: readings have been 140-150/70s.       Caregiver stress: ongoing related to her partner's dementia. Having some trouble with sleep. Falls asleep but not staying asleep.       Macular degeneration: AREDS vitamins    __________________________________________________________    Problem List:  Patient Active Problem List   Diagnosis    Tobacco use disorder    Family history of diabetes mellitus (DM)    Lumbar radiculopathy    Rheumatoid arthritis involving multiple sites with positive rheumatoid factor (CMS-HCC)    COPD (chronic obstructive pulmonary disease) (CMS-HCC)    Diabetes mellitus, type 2 (CMS-HCC)    Chronic diastolic heart failure (CMS-HCC)    Subcapital fracture of neck of right femur (CMS-HCC)       Medications:  Reviewed in EPIC    ROS: remainder of review of systems negative  __________________________________________________________    Physical Exam:   Vital Signs:  Vitals:    10/01/23 1329   BP:  150/75   BP Site: L Arm   BP Position: Sitting   BP Cuff Size: Medium   Pulse: (P) 72   Resp: 18   Temp: (P) 35.9 ??C (96.6 ??F)   TempSrc: (P) Temporal   SpO2: (P) 95%   Weight: (!) 112.6 kg (248 lb 3.2 oz)   Height: 165.1 cm (5' 5)      Wt Readings from Last 3 Encounters:   10/01/23 (!) 112.6 kg (248 lb 3.2 oz)   09/19/23 (!) 112.5 kg (248 lb)   08/26/23 (!) 110.2 kg (243 lb)     Body mass index is 41.3 kg/m??.    Gen: Well appearing, NAD  CV: RRR, no murmurs  Pulm: CTA bilaterally, no crackles or wheezes  Ext: No edema

## 2023-10-01 NOTE — Unmapped (Addendum)
Controlled. Would benefti from weight loss to help with foot pain. Trial of metformin 500 mg daily.     Orders:    Health Maintenance Outside Records Request; Future

## 2023-10-01 NOTE — Unmapped (Addendum)
Following closely with Dr. Berton Lan. Working to increase Humira dosing.

## 2023-10-01 NOTE — Unmapped (Addendum)
Now off oxygen. Cont advair and spiriva. Passed 6 minute walk. Using cpap regularly. CT chest screening in 2/20205

## 2023-10-01 NOTE — Unmapped (Signed)
[facility] at [facility]     Reason for visit: Follow up    Questions / Concerns that need to be addressed:     Screening BP     [facility]ron BPs (complete if screening BP has a systolic  > 130 or diastolic > 80)  BP#1 149/77   BP#2 150/73  BP#3 151/75    Average BP 150/75 P 69  (please note this as a comment in vitals)     PTHomeBP         HCDM reviewed and updated in Epic:    We are working to make sure all of our patients??? wishes are updated in Epic and part of that is documenting a Environmental health practitioner for each patient  A Health Care Decision Maker is someone you choose who can make health care decisions for you if you are not able - who would you most want to do this for you????  was updated.    HCDM (patient stated preference): Cynthia, Ramirez - Brother - [phone]    HCDM, back-up (If primary HCDM is unavailable): Cynthia, Ramirez - Sister - [phone]    BPAs completed:      Annual Screenings:     __________________________________________________________________________________________    SCREENINGS COMPLETED IN FLOWSHEETS      AUDIT       PHQ2       PHQ9          GAD7       COPD Assessment       Falls Risk

## 2023-10-01 NOTE — Unmapped (Addendum)
Start metformin 500mg  (one) daily. After 2-3 weeks, increase to  (2) 1000 mg daily       Blood pressure is too high: start losartan- hydrochlorothiazide 50-12.5 mg daily.     Melatonin for sleep 1-3 mg at bedtime.

## 2023-10-02 NOTE — Unmapped (Signed)
Increasing stress in caring for her partner with dementia. Not feeling depressed but feeling overwhelmed. Not interested in SSRI

## 2023-10-02 NOTE — Unmapped (Signed)
[facility] Refill Coordination Note    Specialty Medication(s) to be Shipped:   Inflammatory Disorders: Humira    Other medication(s) to be shipped: No additional medications requested for fill at this time     KANECIA CHICK, DOB: 04-27-1961  Phone: [phone] (home)       All above HIPAA information was verified with patient.     Was a Nurse, learning disability used for this call? No    Completed refill call assessment today to schedule patient's medication shipment from the [facility]  [phone]).  All relevant notes have been reviewed.     Specialty medication(s) and dose(s) confirmed: Patient reports changes to the regimen as follows: patient waiting on q7d dose approval but will continue with q14days until it goes through.     Changes to medications: Gabryel reports no changes at this time.  Changes to insurance: No  New side effects reported not previously addressed with a pharmacist or physician: None reported  Questions for the pharmacist: No    Confirmed patient received a Conservation officer, historic buildings and a Surveyor, mining with first shipment. The patient will receive a drug information handout for each medication shipped and additional FDA Medication Guides as required.       DISEASE/MEDICATION-SPECIFIC INFORMATION        For patients on injectable medications: Patient currently has 0 doses left.  Next injection is scheduled for 10/21.    SPECIALTY MEDICATION ADHERENCE     Medication Adherence    Patient reported X missed doses in the last month: 0  Specialty Medication: Humira q14d  Patient is on additional specialty medications: No  Informant: patient              Were doses missed due to medication being on hold? No      REFERRAL TO PHARMACIST     Referral to the pharmacist: Not needed      [facility]     Shipping address confirmed in Epic.       Delivery Scheduled: Yes, Expected medication delivery date: 10/22.     Medication will be delivered via Same Day Courier to the prescription address in Epic WAM.    Julianne Rice, PharmD   [facility]  Specialty Pharmacist

## 2023-10-03 ENCOUNTER — Institutional Professional Consult (permissible substitution): Admit: 2023-10-03 | Discharge: 2023-10-04 | Payer: PRIVATE HEALTH INSURANCE

## 2023-10-03 NOTE — Unmapped (Signed)
[facility]  Department of Audiology    AUDIOLOGIC EVALUATION REPORT     PATIENT: Cynthia Ramirez, Cynthia Ramirez  DOB: 07-09-1961  MRN: [id number]  DOS: 10/03/2023    PLEASE SEE AUDIOGRAM INCLUDING FULL REPORT IN MEDIA MANAGER TAB.    HISTORY     Cynthia Ramirez is a 62 y.o. individual who presents to [facility] for an audiologic evaluation. Patient was accompanied by her partner, Cynthia Ramirez to today's appointment.  Her medical history is significant for bilateral middle ear surgeries. Based on the patients description of the middle ear surgeries, the patient likely had the ossicular chain reconstructed due to erosion in 1998 and 2004. The patient also had bilateral PE tubes placed in 2004. The LEFT tube fell out, and the RIGHT tube was taken out in 2019 (by accident, per patient). Today, patient reports that she would like her hearing to be re-tested. She is unsure whether or not she needs a hearing aid.     Patient is referred by Cynthia Dare, MD.    Patient reports a history of:   positive history for ear infections/ear drainage/otorrhea    positive history of ear surgery   negative history of amplification    Today patient reports the following symptoms:   negative for otalgia    negative for aural fullness   negative for tinnitus    positive for hearing loss    RESULTS     Otoscopy  RIGHT Ear: clear external auditory canal - TM perforation noted   LEFT Ear: clear external auditory canal    Tympanometry - using a 226 Hz probe tone  RIGHT Ear: Type B tympanogram with large volume, consistent with tympanic membrane perforation or patent PE tube  LEFT Ear: Type A tympanogram, consistent with normal middle ear pressure, compliance, and volume    Pure Tone and Speech Audiometry  Today's behavioral evaluation was completed using conventional audiometry via insert earphones with good reliability.     Audiometric Results:      RIGHT Ear: Moderately-severe hearing loss through 1000 Hz rising to normal and sloping to severe mixed hearing loss   Speech Reception Threshold (SRT): 45 dB HL  Word Recognition Score (using Recorded NU-6 words): 96% at 75 dB HL with masking in the contralateral ear     LEFT Ear: Mild hearing loss through 1000 Hz rising to normal and sloping to severe conductive hearing loss   Speech Reception Threshold (SRT): 30 dB HL  Word Recognition Score (using Recorded NU-6 words): 100% at 70 dB HL      IMPRESSIONS     Results are stable when compared to those on 11/06/2018.     An asymmetry is noted in thresholds between ears from [phone]. Today's word recognition score in ear demonstrated stable performance when compared with score in previous audiogram on 11/06/2018 (per SPRINT chart data).     Patient was counseled on today's results via verbal communication and expressed understanding.  No barriers to education were identified.    RECOMMENDATIONS      Refer to ENT regarding asymmetry and continued otorrhea   Consider Hearing Aid (HA) consultation with [facility] Adult Audiology team   Use communication strategies for difficult listening environments  Wear hearing protection in the presence of harmful noise  Continue with established therapies    Gloris Manchester, AUD, AuD  Clinical Audiologist  [facility] Adult Audiology Program  Scheduling [phone]    Procedure(s):    CPT [phone] - Tympanometry    CPT  [address] - Comprehensive Audio Eval & Speech Recognition    Visit Time: 60 min    Access your Audiogram via MyChart:  - Log into myChart: Menu > Document Center > Medical Record Requests  - Click the hyperlink 'Request Medical Records'   - Fill out fields + Select Audiograms option under 'Specific Diagnostic Images' > Submit

## 2023-10-06 DIAGNOSIS — J449 Chronic obstructive pulmonary disease, unspecified: Principal | ICD-10-CM

## 2023-10-06 MED ORDER — KLOR-CON M20 MEQ TABLET,EXTENDED RELEASE
ORAL_TABLET | Freq: Every day | ORAL | 3 refills | 90 days
Start: 2023-10-06 — End: ?

## 2023-10-10 NOTE — Unmapped (Signed)
Complex Case Management  SUMMARY NOTE    Attempted to contact pt today at Home number to resolve Complex Case Management services. Left message to return call.; 2nd attempt, letter sent.    Discuss at next visit: No answer, letter sent      Adrian Prince - Case Manager   [facility] Clinical Services  [address],  [place], [place] [address]  p. [phone]   [email or web address]

## 2023-10-22 NOTE — Unmapped (Signed)
[facility] Refill Coordination Note    Specialty Medication(s) to be Shipped:   Inflammatory Disorders: Humira    Other medication(s) to be shipped: No additional medications requested for fill at this time     KEYARIA LAWSON, DOB: 1961/08/05  Phone: [phone] (home)       All above HIPAA information was verified with patient.     Was a Nurse, learning disability used for this call? No    Completed refill call assessment today to schedule patient's medication shipment from the [facility]  [phone]).  All relevant notes have been reviewed.     Specialty medication(s) and dose(s) confirmed: Regimen is correct and unchanged.   Changes to medications: Reginna reports no changes at this time.  Changes to insurance: No  New side effects reported not previously addressed with a pharmacist or physician: None reported  Questions for the pharmacist: No    Confirmed patient received a Conservation officer, historic buildings and a Surveyor, mining with first shipment. The patient will receive a drug information handout for each medication shipped and additional FDA Medication Guides as required.       DISEASE/MEDICATION-SPECIFIC INFORMATION        For patients on injectable medications: Patient currently has 1 doses left.  Next injection is scheduled for 10/27/23.    SPECIALTY MEDICATION ADHERENCE     Medication Adherence    Patient reported X missed doses in the last month: 0  Specialty Medication: HUMIRA(CF) PEN 40 mg/0.4 mL injection (adalimumab)  Patient is on additional specialty medications: No  Patient is on more than two specialty medications: No  Any gaps in refill history greater than 2 weeks in the last 3 months: no  Demonstrates understanding of importance of adherence: yes              Were doses missed due to medication being on hold? No    HUMIRA(CF) PEN 40   mg/ml: 14 days of medicine on hand       REFERRAL TO PHARMACIST     Referral to the pharmacist: Not needed      [facility]     Shipping address confirmed in Epic.       Delivery Scheduled: Yes, Expected medication delivery date: 10/29/23.     Medication will be delivered via Same Day Courier to the prescription address in Epic WAM.    Moshe Salisbury   [facility]  Specialty Technician

## 2023-10-29 ENCOUNTER — Ambulatory Visit: Admit: 2023-10-29 | Discharge: 2023-10-30 | Payer: BLUE CROSS/BLUE SHIELD

## 2023-10-29 DIAGNOSIS — R42 Dizziness and giddiness: Principal | ICD-10-CM

## 2023-10-29 DIAGNOSIS — M545 Acute bilateral low back pain without sciatica: Principal | ICD-10-CM

## 2023-10-29 LAB — CBC
HEMATOCRIT: 39.4 % (ref 34.0–44.0)
HEMOGLOBIN: 12.9 g/dL (ref 11.3–14.9)
MEAN CORPUSCULAR HEMOGLOBIN CONC: 32.6 g/dL (ref 32.0–36.0)
MEAN CORPUSCULAR HEMOGLOBIN: 29.9 pg (ref 25.9–32.4)
MEAN CORPUSCULAR VOLUME: 91.7 fL (ref 77.6–95.7)
MEAN PLATELET VOLUME: 8.4 fL (ref 6.8–10.7)
PLATELET COUNT: 274 10*9/L (ref 150–450)
RED BLOOD CELL COUNT: 4.3 10*12/L (ref 3.95–5.13)
RED CELL DISTRIBUTION WIDTH: 18.6 % — ABNORMAL HIGH (ref 12.2–15.2)
WBC ADJUSTED: 8.7 10*9/L (ref 3.6–11.2)

## 2023-10-29 LAB — BASIC METABOLIC PANEL
ANION GAP: 6 mmol/L (ref 5–14)
BLOOD UREA NITROGEN: 16 mg/dL (ref 9–23)
BUN / CREAT RATIO: 31
CALCIUM: 10 mg/dL (ref 8.7–10.4)
CHLORIDE: 106 mmol/L (ref 98–107)
CO2: 28.5 mmol/L (ref 20.0–31.0)
CREATININE: 0.51 mg/dL — ABNORMAL LOW
EGFR CKD-EPI (2021) FEMALE: >90 mL/min/{1.73_m2} (ref >=60)
GLUCOSE RANDOM: 80 mg/dL (ref 70–179)
POTASSIUM: 3.9 mmol/L (ref 3.4–4.8)
SODIUM: 140 mmol/L (ref 135–145)

## 2023-10-29 MED ORDER — LOSARTAN 25 MG TABLET
ORAL_TABLET | Freq: Every day | ORAL | 3 refills | 90 days | Status: CP
Start: 2023-10-29 — End: 2024-10-28

## 2023-10-29 MED FILL — HUMIRA PEN CITRATE FREE 40 MG/0.4 ML: SUBCUTANEOUS | 28 days supply | Qty: 2 | Fill #1

## 2023-10-29 NOTE — Unmapped (Signed)
Internal Medicine Clinic    Return Visit    Face to Face    Assessment and Plan:     Diagnosis ICD-10-CM Associated Orders   1. Dizzy  R42 Urinalysis with Microscopy     Basic Metabolic Panel     Orthostatic blood pressure      2. Acute bilateral low back pain without sciatica  M54.50 Urinalysis with Microscopy     Basic Metabolic Panel     Orthostatic blood pressure     CBC        Dizziness with standing and bending  3 new medications  Not orthostatic  Shared decision eval urinalysis, CBC, BMP  Stop losartan-hydrochlorothiazide  Begin losartan alone at lower dose 25 mg daily and gradually increase as needed and tolerated  She can continue metformin alone    If worse, fevers, chills, cough, sob, be seen    Back pain  Urinalysis  Culture if abnormal  No CVA ttp  Favor msk strain?  Lying down reproduces lumbar pain      I spent a total of 30 min total including pre intra and post visit activities    Medication adherence and barriers to the treatment plan have been addressed. Opportunities to optimize healthy behaviors have been discussed. Patient / caregiver voiced understanding.      _____________________________________________________________________    Chief Complaint:  fu bp, back pain/dizzy    Patient Active Problem List    Diagnosis Date Noted    Caregiver stress 10/02/2023    Subcapital fracture of neck of right femur (CMS-HCC) 07/18/2022    Chronic diastolic heart failure (CMS-HCC) 04/15/2022    Diabetes mellitus, type 2 (CMS-HCC) 10/09/2018    COPD (chronic obstructive pulmonary disease) (CMS-HCC) 03/06/2018    Rheumatoid arthritis involving multiple sites with positive rheumatoid factor (CMS-HCC) 07/25/2017    Lumbar radiculopathy 04/23/2017    Tobacco use disorder     Family history of diabetes mellitus (DM)        HPI:  Cynthia Ramirez is a 62 yo female with PMH DM II, COPD, tobacco use disorder, HTN, HF here for follow up bp    Pcp started metformin and losartan-hydrochlorothiazide combo pill for hypertension last visit    Back pain hurting/kidneys hurting 4 days.  No injuries or falls that she knows of    No pelvic numbness, bowel or bladder incontinence or radiation to legs/buttocks    No burning with urination.  No blood in urine that she recalls.  Urinating more.    Dizzy bending over as well that is new    No fevers, chills, cough, sore throat, headache    Maybe it is too much medication something is not agreeing with her she says    Current Outpatient Medications on File Prior to Visit   Medication Sig    acetaminophen (TYLENOL) 325 MG tablet Take 2 tablets (650 mg total) by mouth two (2) times a day as needed.    albuterol HFA 90 mcg/actuation inhaler Inhale 2 puffs every six (6) hours as needed for wheezing.    ascorbic acid (VITAMIN C ORAL) Take 1 tablet by mouth daily.    atorvastatin (LIPITOR) 40 MG tablet Take 1 tablet (40 mg total) by mouth daily.    cholecalciferol, vitamin D3-50 mcg, 2,000 unit,, 50 mcg (2,000 unit) tablet Take 1 tablet (50 mcg total) by mouth daily. 2 tablets daily    fluticasone propion-salmeterol (ADVAIR HFA) 230-21 mcg/actuation inhaler INHALE 2 PUFFS TWO TIMES A DAY  folic acid (FOLVITE) 1 MG tablet TAKE 1 TABLET BY MOUTH EVERY DAY    furosemide (LASIX) 40 MG tablet Take 1 tablet (40 mg total) by mouth daily. Take one tablet daily as needed for weight gain (goal weight ~248-251lbs) (Patient taking differently: Take 0.5 tablets (20 mg total) by mouth daily. Take one tablet daily as needed for edema. Do not administer if SBP<100.)    HUMIRA PEN CITRATE FREE 40 MG/0.4 ML Inject the contents of 1 pen (40 mg total) under the skin every fourteen (14) days.    inhalational spacing device (AEROCHAMBER MV) Spcr 1 each by Miscellaneous route daily.    losartan-hydroCHLOROthiazide (HYZAAR) 50-12.5 mg per tablet Take 1 tablet by mouth daily.    magnesium oxide (MAG-OX) 400 mg (241.3 mg elemental magnesium) tablet Take 1 tablet (400 mg total) by mouth daily.    metFORMIN (GLUCOPHAGE) 500 MG tablet Take 1 tablet (500 mg total) by mouth in the morning and 1 tablet (500 mg total) in the evening. Take with meals.    methotrexate 2.5 MG tablet TAKE 8 TABLETS (20 MG TOTAL) BY MOUTH ONCE A WEEK.    multivitamin (TAB-A-VITE/THERAGRAN) per tablet Take 1 tablet by mouth daily.    omeprazole (PRILOSEC) 20 MG capsule TAKE 1 CAPSULE BY MOUTH EVERY DAY IN THE MORNING    tiotropium bromide (SPIRIVA RESPIMAT) 2.5 mcg/actuation inhalation mist Inhale 2 puffs daily.    [DISCONTINUED] umeclidinium-vilanteroL (ANORO ELLIPTA) 62.5-25 mcg/actuation inhaler Inhale 1 puff BY MOUTH daily. For benefit inquiry only. Please send Epic chat message to Sejal K with coverage information, then discontinue Rx.     No current facility-administered medications on file prior to visit.        Review of Systems - HPI    Exam:  BP 149/81 (BP Site: L Arm, BP Position: Sitting, BP Cuff Size: Large)  - Pulse 78  - Temp 37 ??C (98.6 ??F) (Temporal)  - Resp 18  - Ht 165.1 cm (5' 5)  - Wt (!) 108.9 kg (240 lb)  - LMP  (LMP Unknown)  - SpO2 97%  - BMI 39.94 kg/m??   Vitals:    10/29/23 1142 10/29/23 1218 10/29/23 1219 10/29/23 1221   Orthostatic BP: 149/81 142/84 139/81 152/78   Orthostatic Pulse: 78 75 80 74   BP Site: L Arm L Arm L Arm L Arm   BP Position: Sitting Sitting Standing Supine   BP Cuff Size: Large Large Large Large       Gen: chronically ill appearing in NAD  CV: RRR no mrg  Pulm:  CTA bl  No CVA ttp      Laural Benes, PA-C  Supervising physician Gabrielle Dare, MD

## 2023-10-29 NOTE — Unmapped (Signed)
Good to see you   We will check urine, kidneys and complete blood count  Stop losartan-hydrochlorothiazide tablet  Start losartan 25 mg once daily   Continue other medications

## 2023-10-29 NOTE — Unmapped (Signed)
[facility] at [facility]     Reason for visit: Follow up    Questions / Concerns that need to be addressed: Back pain since the weekend, and having some dizziness when bending over.     Screening BP- 149/81 P 78    Omron BPs (complete if screening BP has a systolic  > 130 or diastolic > 80)  BP#1    BP#2   BP#3     Average BP   (please note this as a comment in vitals)     PTHomeBP     Diabetes:  Regularly checking blood sugars?: no  If yes, when? Complete log for past 7 days  Date Before Breakfast After Breakfast Before Lunch After Lunch Before Dinner After Dinner Before Bed                                                                                                                                   HCDM reviewed and updated in Epic:    We are working to make sure all of our patients??? wishes are updated in Epic and part of that is documenting a Environmental health practitioner for each patient  A Health Care Decision Maker is someone you choose who can make health care decisions for you if you are not able - who would you most want to do this for you????  is already up to date.    HCDM (patient stated preference): Hailee, Keitt - Brother - [phone]    HCDM, back-up (If primary HCDM is unavailable): Aubreyann, Zermeno - Sister - [phone]    BPAs completed:  N/A     Annual Screenings:   Tobacco  __________________________________________________________________________________________    SCREENINGS COMPLETED IN FLOWSHEETS      AUDIT       PHQ2       PHQ9          GAD7       COPD Assessment       Falls Risk

## 2023-10-30 ENCOUNTER — Encounter: Payer: Self-pay | Admitting: Emergency Medicine

## 2023-10-30 ENCOUNTER — Ambulatory Visit
Admission: EM | Admit: 2023-10-30 | Discharge: 2023-10-30 | Disposition: A | Discharge status: Home / Self Care | Payer: BLUE CROSS/BLUE SHIELD | Attending: Family Medicine | Admitting: Family Medicine

## 2023-10-30 DIAGNOSIS — M545 Low back pain, unspecified: Secondary | ICD-10-CM | POA: Diagnosis present

## 2023-10-30 DIAGNOSIS — B3731 Acute candidiasis of vulva and vagina: Secondary | ICD-10-CM | POA: Diagnosis not present

## 2023-10-30 LAB — URINALYSIS, W/ REFLEX TO CULTURE (INFECTION SUSPECTED)
Bilirubin Urine: NEGATIVE
Glucose, UA: NEGATIVE mg/dL
Ketones, ur: NEGATIVE mg/dL
Leukocytes,Ua: NEGATIVE
Nitrite: NEGATIVE
Protein, ur: NEGATIVE mg/dL
Specific Gravity, Urine: <1.005 — ABNORMAL LOW (ref 1.005–1.030)
pH: 6 (ref 5.0–8.0)

## 2023-10-30 LAB — WET PREP, GENITAL
Clue Cells Wet Prep HPF POC: NONE SEEN
Sperm: NONE SEEN
Trich, Wet Prep: NONE SEEN
WBC, Wet Prep HPF POC: >10 — AB (ref <10)

## 2023-10-30 MED ORDER — DICLOFENAC SODIUM 75 MG PO TBEC: 75.0000 mg | DELAYED_RELEASE_TABLET | Freq: Two times a day (BID) | ORAL | 1 refills | Status: AC | PRN

## 2023-10-30 MED ORDER — BACLOFEN 10 MG PO TABS: 10.0000 mg | ORAL_TABLET | Freq: Three times a day (TID) | ORAL | 0 refills | Status: AC | PRN

## 2023-10-30 MED ORDER — FLUCONAZOLE 150 MG PO TABS: ORAL_TABLET | ORAL | 0 refills | Status: AC

## 2023-10-30 NOTE — ED Provider Notes (Signed)
[facility]    CSN: [id number] Arrival date & time: 10/30/23  1102      History   Chief Complaint Chief Complaint  Patient presents with   Vaginal Pain   Flank Pain   Vaginal Itching   Vaginal Discharge    HPI Kelly Trujillo is a 62 y.o. female with history of obesity, COPD, hypertension, rheumatoid arthritis, and type 2 diabetes.  Patient presents for 3-4 day history of bilateral lower back pain which is worse with leaning forward and prolonged sitting. Saw PCP yesterday and a urine was given, but test never ran. She would like to have the urine checked today for possible UTI.  She denies dysuria, frequency, urgency, fever, blood in urine, abdominal pain.  Patient also reports vaginal discharge, pain and irritation.   Patient has taken Tylenol and oxycodone from her partner for the pain.  She says she has never had a history of back pain and does not understand why she has this discomfort.   HPI  Past Medical History:  Diagnosis Date   COPD (chronic obstructive pulmonary disease) (HCC)    Hypertension     Patient Active Problem List   Diagnosis Date Noted   Obesity, Class III, BMI 40-49.9 (morbid obesity) (HCC) 07/15/2022   Sepsis (HCC) 07/14/2022   Acute pyelonephritis 07/14/2022   CAP (community acquired pneumonia) 07/14/2022   Kidney lesion 07/14/2022   COPD (chronic obstructive pulmonary disease) (HCC)    HTN (hypertension)    RA (rheumatoid arthritis) (HCC)    Chronic diastolic CHF (congestive heart failure) (HCC)    Lesion of spleen    Myocardial injury    Right rib fracture    Liver lesion    Positive D dimer     Past Surgical History:  Procedure Laterality Date   ABDOMINAL HYSTERECTOMY     CYST EXCISION  1980s   KNEE ARTHROSCOPY WITH MEDIAL MENISECTOMY Left 08/16/2016   Procedure: KNEE ARTHROSCOPY WITH SYNOVECTOMY, MEDIAL AND LATERAL MENISCUS TEAR, LOOSE BODY;  Surgeon: Kennedy Bucker, MD;  Location: [facility] ORS;  Service: Orthopedics;   Laterality: Left;   TYMPANOPLASTY      OB History   No obstetric history on file.      Home Medications    Prior to Admission medications   Medication Sig Start Date End Date Taking? Authorizing Provider  baclofen (LIORESAL) 10 MG tablet Take 1 tablet (10 mg total) by mouth 3 (three) times daily as needed for muscle spasms. 10/30/23  Yes Shirlee Latch, PA-C  diclofenac (VOLTAREN) 75 MG EC tablet Take 1 tablet (75 mg total) by mouth 2 (two) times daily as needed for moderate pain (pain score 4-6). 10/30/23  Yes Shirlee Latch, PA-C  fluconazole (DIFLUCAN) 150 MG tablet Take 1 tab po q72 hr prn yeast infection 10/30/23  Yes Kailen Name B, PA-C  HUMIRA, 2 PEN, 40 MG/0.4ML pen Inject into the skin. 09/30/23  Yes [provider]  acetaminophen (TYLENOL) 325 MG tablet Take 650 mg by mouth every 6 (six) hours as needed for mild pain or moderate pain.    [provider]  albuterol (VENTOLIN HFA) 108 (90 Base) MCG/ACT inhaler Inhale 1-2 puffs into the lungs every 6 (six) hours as needed for wheezing or shortness of breath.    [provider]  ascorbic acid (VITAMIN C) 500 MG tablet Take 500 mg by mouth daily.    [provider]  atorvastatin (LIPITOR) 40 MG tablet Take 40 mg by mouth daily.  [provider]  cholecalciferol (VITAMIN D3) 25 MCG (1000 UNIT) tablet Take 1,000 Units by mouth daily.    [provider]  fluticasone-salmeterol (ADVAIR HFA) 230-21 MCG/ACT inhaler Inhale 2 puffs into the lungs daily.    [provider]  folic acid (FOLVITE) 1 MG tablet Take 1 mg by mouth daily.    [provider]  furosemide (LASIX) 40 MG tablet Take 40 mg by mouth daily as needed for fluid or edema.    [provider]  hydroxychloroquine (PLAQUENIL) 200 MG tablet Take 200 mg by mouth 2 (two) times daily.    [provider]  magnesium oxide (MAG-OX) 400 MG tablet Take 400 mg by mouth daily.    [provider]  methotrexate (RHEUMATREX) 2.5 MG tablet Take 20 mg by mouth every Monday.    [provider]  Multiple Vitamins-Minerals (MULTIVITAMIN WITH MINERALS) tablet Take 1 tablet by mouth daily.    [provider]  omeprazole (PRILOSEC) 20 MG capsule Take 20 mg by mouth See admin instructions. Take 1 capsule (20mg ) by mouth every day Tuesday through Sunday - do not take on Monday    [provider]  oxyCODONE (OXY IR/ROXICODONE) 5 MG immediate release tablet Take 5 mg by mouth every 12 (twelve) hours as needed for severe pain.    [provider]  potassium chloride SA (KLOR-CON M) 20 MEQ tablet Take 20 mEq by mouth daily.    [provider]  predniSONE (DELTASONE) 5 MG tablet Take 5 mg by mouth daily.    [provider]  sulfaSALAzine (AZULFIDINE) 500 MG EC tablet Take 500 mg by mouth 2 (two) times daily.    [provider]  Tiotropium Bromide Monohydrate 2.5 MCG/ACT AERS Inhale 2 each into the lungs daily.    [provider]    Family History Family History  Problem Relation Age of Onset   Colon cancer Mother    Heart failure Father     Social History Social History   Tobacco Use   Smoking status: Former    Current packs/day: 0.00    Average packs/day: 1 pack/day for 35.0 years (35.0 ttl pk-yrs)    Types: Cigarettes    Start date: 01/19/1987    Quit date: 01/19/2022    Years since quitting: 1.7   Smokeless tobacco: Never  Vaping Use   Vaping status: Never Used  Substance Use Topics   Alcohol use: Not Currently    Comment: rare   Drug use: No     Allergies   Shellfish allergy, Other, and Sulfa antibiotics   Review of Systems Review of Systems  Constitutional:  Negative for chills, fatigue and fever.  Gastrointestinal:  Negative for abdominal pain, diarrhea, nausea and vomiting.  Genitourinary:  Positive for vaginal discharge and vaginal pain. Negative for decreased urine volume, dysuria, flank  pain, frequency, hematuria, pelvic pain, urgency and vaginal bleeding.  Musculoskeletal:  Positive for back pain.  Skin:  Negative for rash.  Neurological:  Negative for weakness and numbness.     Physical Exam Triage Vital Signs ED Triage Vitals  Encounter Vitals Group     BP 10/30/23 1221 (!) 145/82     Systolic BP Percentile --      Diastolic BP Percentile --      Pulse Rate 10/30/23 1221 65     Resp 10/30/23 1221 18     Temp 10/30/23 1221 97.8 F (36.6 C)     Temp Source 10/30/23 1221 Oral  SpO2 10/30/23 1221 93 %     Weight --      Height --      Head Circumference --      Peak Flow --      Pain Score 10/30/23 1218 4     Pain Loc --      Pain Education --      Exclude from Growth Chart --    No data found.  Updated Vital Signs BP (!) 145/82 (BP Location: Right Arm)   Pulse 65   Temp 97.8 F (36.6 C) (Oral)   Resp 18   SpO2 94%       Physical Exam Vitals and nursing note reviewed.  Constitutional:      General: She is not in acute distress.    Appearance: Normal appearance. She is not ill-appearing or toxic-appearing.  HENT:     Head: Normocephalic and atraumatic.  Eyes:     General: No scleral icterus.       Right eye: No discharge.        Left eye: No discharge.     Conjunctiva/sclera: Conjunctivae normal.  Cardiovascular:     Rate and Rhythm: Normal rate and regular rhythm.     Heart sounds: Normal heart sounds.  Pulmonary:     Effort: Pulmonary effort is normal. No respiratory distress.     Breath sounds: Normal breath sounds.  Abdominal:     Palpations: Abdomen is soft.     Tenderness: There is no abdominal tenderness. There is no right CVA tenderness or left CVA tenderness.  Musculoskeletal:     Cervical back: Neck supple.     Lumbar back: Tenderness (bilateral paralumbar muscles) present. No bony tenderness. Decreased range of motion.  Skin:    General: Skin is dry.  Neurological:     General: No focal deficit present.     Mental  Status: She is alert. Mental status is at baseline.     Motor: No weakness.     Gait: Gait abnormal (uses walker).  Psychiatric:        Mood and Affect: Mood normal.        Behavior: Behavior normal.      UC Treatments / Results  Labs (all labs ordered are listed, but only abnormal results are displayed) Labs Reviewed  WET PREP, GENITAL - Abnormal; Notable for the following components:      Result Value   Yeast Wet Prep HPF POC PRESENT (*)    WBC, Wet Prep HPF POC >10 (*)    All other components within normal limits  URINALYSIS, W/ REFLEX TO CULTURE (INFECTION SUSPECTED) - Abnormal; Notable for the following components:   Specific Gravity, Urine <1.005 (*)    Hgb urine dipstick TRACE (*)    Bacteria, UA FEW (*)    All other components within normal limits   (ABNORMAL) CBC (10/29/2023 12:40 PM EST) Lab Results - (ABNORMAL) CBC (10/29/2023 12:40 PM EST) Component Value Ref Range Test Method Analysis Time Performed At Pathologist Signature  WBC 8.7 3.6 - 11.2 10*9/L   10/29/2023 1:04 PM EST [facility]    RBC 4.30 3.95 - 5.13 10*12/L   10/29/2023 1:04 PM EST [facility]    HGB 12.9 11.3 - 14.9 g/dL   65/78/4696 2:95 PM EST [facility]    HCT 39.4 34.0 - 44.0 %   10/29/2023 1:04 PM EST [facility]    MCV 91.7 77.6 - 95.7 fL   10/29/2023 1:04  PM EST [facility]    MCH 29.9 25.9 - 32.4 pg   10/29/2023 1:04 PM EST [facility]    MCHC 32.6 32.0 - 36.0 g/dL   52/84/1324 4:01 PM EST [facility]    RDW 18.6 (H) 12.2 - 15.2 %   10/29/2023 1:04 PM EST [facility]    MPV 8.4 6.8 - 10.7 fL   10/29/2023 1:04 PM EST [facility]    Platelet 274 150 - 450 10*9/L   10/29/2023 1:04 PM EST [facility]     Lab Results - (ABNORMAL) CBC (10/29/2023 12:40 PM EST) Specimen (Source) Anatomical Location / Laterality Collection Method / Volume Collection Time Received Time   Blood   Venipuncture / Unknown 10/29/2023 12:40 PM EST 10/29/2023 12:46 PM EST   (ABNORMAL) Basic Metabolic Panel (10/29/2023 12:40 PM EST) Lab Results - (ABNORMAL) Basic Metabolic Panel (10/29/2023 12:40 PM EST) Component Value Ref Range Test Method Analysis Time Performed At Pathologist Signature  Sodium 140 135 - 145 mmol/L   10/29/2023 1:17 PM EST [facility]    Potassium 3.9 3.4 - 4.8 mmol/L   10/29/2023 1:17 PM EST [facility]    Chloride 106 98 - 107 mmol/L   10/29/2023 1:17 PM EST [facility]    CO2 28.5 20.0 - 31.0 mmol/L   10/29/2023 1:17 PM EST [facility]    Anion Gap 6 5 - 14 mmol/L   10/29/2023 1:17 PM EST [facility]    BUN 16 9 - 23 mg/dL   02/72/5366 4:40 PM EST [facility]    Creatinine 0.51 (L) 0.55 - 1.02 mg/dL   34/74/2595 6:38 PM EST [facility]    BUN/Creatinine Ratio 31     10/29/2023 1:17 PM EST [facility]    eGFR CKD-EPI (2021) Female >90 >=60 mL/min/1.57m2   10/29/2023 1:17 PM EST [facility]    Comment: eGFR calculated with CKD-EPI 2021 equation in accordance with [employer] and [employer] of Nephrology Task Force recommendations.  Glucose 80 70 - 179 mg/dL   75/64/3329 5:18 PM EST [facility]    Calcium 10.0 8.7 - 10.4 mg/dL   84/16/6063 0:16 PM EST [facility]     Lab Results - (ABNORMAL) Basic Metabolic Panel (10/29/2023 12:40 PM EST) Specimen (Source) Anatomical Location / Laterality Collection Method / Volume Collection Time Received Time  Blood   Venipuncture / Unknown 10/29/2023 12:40 PM EST 10/29/2023 12:46 PM EST     EKG   Radiology No results found.  Procedures Procedures (including critical care time)  Medications Ordered in UC Medications - No data to display  Initial Impression / Assessment and Plan / UC Course  I have reviewed the triage vital signs and the nursing  notes.  Pertinent labs & imaging results that were available during my care of the patient were reviewed by me and considered in my medical decision making (see chart for details).   62 year old female with history of hypertension, obesity, COPD, rheumatoid arthritis and type 2 diabetes presents for lower back pain for the past 3 to 4 days.  Also reports vaginal discharge, itching and irritation.  Saw PCP yesterday for her symptoms and was thought to have muscular strain.  A urine test was obtained but the test was not done.  Patient would like to have her urine checked today for UTI.  She denies any urinary symptoms.  Vitals  are stable.  She is overall well-appearing.  Uses a walker.  She has tenderness to the bilateral paralumbar muscles.  Urinalysis not consistent with UTI.  Wet prep obtained to assess for possible vaginal infection.  Positive for yeast infection.  Sent Diflucan to pharmacy.  Advised patient back pain is consistent with musculoskeletal condition.  Sent baclofen to pharmacy.  Also sent diclofenac sodium.  Advised Tylenol, heat, rest, stretching.  Reviewed ED precautions for back pain.  Advised to continue following up with PCP.   Final Clinical Impressions(s) / UC Diagnoses   Final diagnoses:  Acute bilateral low back pain without sciatica  Vaginal yeast infection     Discharge Instructions      BACK PAIN: Stressed avoiding painful activities . RICE (REST, ICE, COMPRESSION, ELEVATION) guidelines reviewed. May alternate ice and heat. Consider use of muscle rubs, Salonpas patches, etc. Use medications as directed including muscle relaxers if prescribed. Take anti-inflammatory medications as prescribed or OTC NSAIDs/Tylenol.  F/u with PCP in 7-10 days for reexamination, and please feel free to call or return to the urgent care at any time for any questions or concerns you may have and we will be happy to help you!   BACK PAIN RED FLAGS: If the back pain acutely worsens or  there are any red flag symptoms such as numbness/tingling, leg weakness, saddle anesthesia, or loss of bowel/bladder control, go immediately to the ER. Follow up with [place] as scheduled or sooner if the pain does not begin to resolve or if it worsens before the follow up    The most common types of vaginal infections are yeast infections and bacterial vaginosis. Neither of which are really considered to be sexually transmitted. Often a pH swab or wet prep is performed and if abnormal may reveal either type of infection. Begin metronidazole if prescribed for possible BV infection. If there is concern for yeast infection, fluconazole is often prescribed . Take this as directed. You may also apply topical miconazole (can be purchased OTC) externally for relief of itching. Increase rest and fluid intake. If labs sent out, we will call within 2-5 days with results and amend treatment if necessary. Always try to use pH balanced washes/wipes, urinate after intercourse, stay hydrated, and take probiotics if you are prone to vaginal infections. Return or see PCP or gynecologist for new/worsening infections.       ED Prescriptions     Medication Sig Dispense Auth. Provider   fluconazole (DIFLUCAN) 150 MG tablet Take 1 tab po q72 hr prn yeast infection 2 tablet Eusebio Friendly B, PA-C   baclofen (LIORESAL) 10 MG tablet Take 1 tablet (10 mg total) by mouth 3 (three) times daily as needed for muscle spasms. 30 each Shirlee Latch, PA-C   diclofenac (VOLTAREN) 75 MG EC tablet Take 1 tablet (75 mg total) by mouth 2 (two) times daily as needed for moderate pain (pain score 4-6). 30 tablet Gareth Morgan      PDMP not reviewed this encounter.   Shirlee Latch, PA-C 10/30/23 1301

## 2023-10-30 NOTE — Discharge Instructions (Addendum)
BACK PAIN: Stressed avoiding painful activities . RICE (REST, ICE, COMPRESSION, ELEVATION) guidelines reviewed. May alternate ice and heat. Consider use of muscle rubs, Salonpas patches, etc. Use medications as directed including muscle relaxers if prescribed. Take anti-inflammatory medications as prescribed or OTC NSAIDs/Tylenol.  F/u with PCP in 7-10 days for reexamination, and please feel free to call or return to the urgent care at any time for any questions or concerns you may have and we will be happy to help you!   BACK PAIN RED FLAGS: If the back pain acutely worsens or there are any red flag symptoms such as numbness/tingling, leg weakness, saddle anesthesia, or loss of bowel/bladder control, go immediately to the ER. Follow up with [place] as scheduled or sooner if the pain does not begin to resolve or if it worsens before the follow up    The most common types of vaginal infections are yeast infections and bacterial vaginosis. Neither of which are really considered to be sexually transmitted. Often a pH swab or wet prep is performed and if abnormal may reveal either type of infection. Begin metronidazole if prescribed for possible BV infection. If there is concern for yeast infection, fluconazole is often prescribed . Take this as directed. You may also apply topical miconazole (can be purchased OTC) externally for relief of itching. Increase rest and fluid intake. If labs sent out, we will call within 2-5 days with results and amend treatment if necessary. Always try to use pH balanced washes/wipes, urinate after intercourse, stay hydrated, and take probiotics if you are prone to vaginal infections. Return or see PCP or gynecologist for new/worsening infections.

## 2023-10-30 NOTE — ED Triage Notes (Signed)
Pt presents with bilateral flank pain, her vaginal area feels raw, vaginal discharge and itching x 1 week.

## 2023-11-22 NOTE — Unmapped (Signed)
[facility] Delivery Pharm[facility] Refill Coordination Note    Cynthia Ramirez, DOB: 27-Nov-1961  Phone: [phone] (home)       All above HIPAA information was verified with patient.         11/22/2023    11:44 AM   Specialty Rx Medication Refill Questionnaire   Which Medications would you like refilled and shipped? Humira.   Please list all current allergies: None   Have you missed any doses in the last 30 days? No   Have you had any changes to your medication(s) since your last refill? No   How many days remaining of each medication do you have at home? 7   If receiving an injectable medication, next injection date is 12/02/2023   Have you experienced any side effects in the last 30 days? No   Please enter the full address (street address, city, state, zip code) where you would like your medication(s) to be delivered to. [address] , [place],  [place]. [address]   Please specify on which day you would like your medication(s) to arrive. Note: if you need your medication(s) within 3 days, please call the pharmacy to schedule your order at [phone]  11/26/2023   Has your insurance changed since your last refill? No   Would you like a pharmacist to call you to discuss your medication(s)? No   Do you require a signature for your package? (Note: if we are billing Medicare Part B or your order contains a controlled substance, we will require a signature) No         Completed refill call assessment today to schedule patient's medication shipment from the [facility] [phone]).  All relevant notes have been reviewed.       Confirmed patient received a Conservation officer, historic buildings and a Surveyor, mining with first shipment. The patient will receive a drug information handout for each medication shipped and additional FDA Medication Guides as required.         REFERRAL TO PHARMACIST     Referral to the pharmacist: Not needed      [facility]     Shipping address confirmed in Epic. Delivery Scheduled: Yes, Expected medication delivery date: 11/26/2023.     Medication will be delivered via Same Day Courier to the prescription address in Epic WAM.    Dorisann Frames   [facility] Pharm[facility] Specialty Technician

## 2023-11-26 MED FILL — HUMIRA PEN CITRATE FREE 40 MG/0.4 ML: SUBCUTANEOUS | 28 days supply | Qty: 2 | Fill #2

## 2023-12-04 ENCOUNTER — Ambulatory Visit: Admit: 2023-12-04 | Discharge: 2023-12-05 | Payer: BLUE CROSS/BLUE SHIELD

## 2023-12-04 DIAGNOSIS — K047 Periapical abscess without sinus: Principal | ICD-10-CM

## 2023-12-04 DIAGNOSIS — E119 Type 2 diabetes mellitus without complications: Principal | ICD-10-CM

## 2023-12-04 DIAGNOSIS — I10 Essential (primary) hypertension: Principal | ICD-10-CM

## 2023-12-04 DIAGNOSIS — J449 Chronic obstructive pulmonary disease, unspecified: Principal | ICD-10-CM

## 2023-12-04 MED ORDER — BACLOFEN 10 MG TABLET
ORAL_TABLET | Freq: Three times a day (TID) | ORAL | 1 refills | 10.00 days | Status: CP
Start: 2023-12-04 — End: 2024-12-03

## 2023-12-04 MED ORDER — CEFUROXIME AXETIL 500 MG TABLET
ORAL_TABLET | Freq: Two times a day (BID) | ORAL | 0 refills | 5.00 days | Status: CP
Start: 2023-12-04 — End: ?

## 2023-12-04 MED ORDER — AMOXICILLIN 500 MG CAPSULE
ORAL_CAPSULE | Freq: Three times a day (TID) | ORAL | 0 refills | 30.00 days | Status: CN
Start: 2023-12-04 — End: ?

## 2023-12-04 MED ORDER — TIOTROPIUM BROMIDE 2.5 MCG/ACTUATION MIST FOR INHALATION
Freq: Every day | RESPIRATORY_TRACT | 11 refills | 120.00 days | Status: CP
Start: 2023-12-04 — End: ?

## 2023-12-04 MED ORDER — FLUTICASONE PROPIONATE 230 MCG-SALMETEROL 21 MCG/ACTUATION HFA INHALER
Freq: Two times a day (BID) | RESPIRATORY_TRACT | 3 refills | 0.00 days | Status: CP
Start: 2023-12-04 — End: ?

## 2023-12-04 NOTE — Unmapped (Signed)
[facility] at [facility]     Reason for visit: Follow up    Questions / Concerns that need to be addressed: medication refill,metformin/diarrhea. Toothache. Wrists pain. Right is worse.     Screening BP-   118/64  65      PTHomeBP       HCDM reviewed and updated in Epic:    We are working to make sure all of our patients??? wishes are updated in Epic and part of that is documenting a Environmental health practitioner for each patient  A Health Care Decision Maker is someone you choose who can make health care decisions for you if you are not able - who would you most want to do this for you????  is already up to date.    HCDM (patient stated preference): Cynthia Ramirez, Cynthia Ramirez - Brother - [phone]    HCDM, back-up (If primary HCDM is unavailable): Brittian,Virgina - Sister - [phone]      __________________________________________________________________________________________    SCREENINGS COMPLETED IN FLOWSHEETS      AUDIT       PHQ2       PHQ9          GAD7       COPD Assessment       Falls Risk

## 2023-12-04 NOTE — Unmapped (Signed)
Stop metformin  Baclofen (muscle relaxant) ok to use very sparingly  Blood pressure looks great!  Continue losartan once daily  Antibiotic for tooth pain, please see your dentist as well

## 2023-12-04 NOTE — Unmapped (Signed)
Internal Medicine Clinic    Return Visit    Face to Face    Assessment and Plan:     Diagnosis ICD-10-CM Associated Orders   1. Essential hypertension, benign  I10       2. Chronic obstructive pulmonary disease, unspecified COPD type (CMS-HCC)  J44.9 tiotropium bromide (SPIRIVA RESPIMAT) 2.5 mcg/actuation inhalation mist     fluticasone propion-salmeterol (ADVAIR HFA) 230-21 mcg/actuation inhaler      3. Dental infection  K04.7 cefuroxime (CEFTIN) 500 MG tablet      4. Type 2 diabetes mellitus without complication, without long-term current use of insulin (CMS-HCC)  E11.9         HTN controlled  Cont losartan 25 mg daily  Labs not needed, done last time and meds actually reduced since then    DM II  Lab Results   Component Value Date    A1C 5.8 10/01/2023   Metformin added for weight support  She would like to stop   We discussed potentially adding xr 500 mg once daily next time as she may tolerate better  Revisit    Dental infection  Antibiotic prescribed and she will see dentist  Amoxicillin preferred by interacts with her methotrexate, so cefuroxime prescribed instead    Low back pain, mostly resolved and baclofen helps  She can use prn, we reviewed side effects, dizziness, fall risk    Keep visit in January    I spent a total of 30 min total including pre intra and post visit activities    Medication adherence and barriers to the treatment plan have been addressed. Opportunities to optimize healthy behaviors have been discussed. Patient / caregiver voiced understanding.      _____________________________________________________________________    Chief Complaint:  fu    Patient Active Problem List    Diagnosis Date Noted    Caregiver stress 10/02/2023    Subcapital fracture of neck of right femur (CMS-HCC) 07/18/2022    Chronic diastolic heart failure (CMS-HCC) 04/15/2022    Diabetes mellitus, type 2 (CMS-HCC) 10/09/2018    COPD (chronic obstructive pulmonary disease) (CMS-HCC) 03/06/2018    Rheumatoid arthritis involving multiple sites with positive rheumatoid factor (CMS-HCC) 07/25/2017    Lumbar radiculopathy 04/23/2017    Tobacco use disorder     Family history of diabetes mellitus (DM)        HPI:  Cynthia Ramirez is a 62 yo female with PMH DM II, COPD, tobacco use disorder, HTN, HF here for follow up bp    Metformin causing diarrhea.  Going 4-5 times a day, sometimes leaking stool or can't get to the bathroom in time.    Causing gas and mild cramping    Taking 500 mg twice a day and takes with food    Would like to stop this medication    Last time we stopped losartan-hydrochlorothiazide and started losartan 25 mg daily.  She thinks she is taking this    No medications with her today    Went to urgent care and gave baclofen and diclofenac.  Diclofenac didn't help for low back pain so stopped, but baclofen does and she takes it as  needed.  <1 time a day    Would like more to have on hand if she can    Has a dental infection right upper tooth.  Has not see dentist yet but would like an antibiotic because she knows they will put her on one first.    No fevers, just pain  Dizziness much better with bp med changes    Current Outpatient Medications on File Prior to Visit   Medication Sig    acetaminophen (TYLENOL) 325 MG tablet Take 2 tablets (650 mg total) by mouth two (2) times a day as needed.    albuterol HFA 90 mcg/actuation inhaler Inhale 2 puffs every six (6) hours as needed for wheezing.    ascorbic acid (VITAMIN C ORAL) Take 1 tablet by mouth daily.    atorvastatin (LIPITOR) 40 MG tablet Take 1 tablet (40 mg total) by mouth daily.    cholecalciferol, vitamin D3-50 mcg, 2,000 unit,, 50 mcg (2,000 unit) tablet Take 1 tablet (50 mcg total) by mouth daily. 2 tablets daily    fluticasone propion-salmeterol (ADVAIR HFA) 230-21 mcg/actuation inhaler INHALE 2 PUFFS TWO TIMES A DAY    folic acid (FOLVITE) 1 MG tablet TAKE 1 TABLET BY MOUTH EVERY DAY    furosemide (LASIX) 40 MG tablet Take 1 tablet (40 mg total) by mouth daily. Take one tablet daily as needed for weight gain (goal weight ~248-251lbs) (Patient taking differently: Take 0.5 tablets (20 mg total) by mouth daily. Take one tablet daily as needed for edema. Do not administer if SBP<100.)    HUMIRA PEN CITRATE FREE 40 MG/0.4 ML Inject the contents of 1 pen (40 mg total) under the skin every fourteen (14) days.    inhalational spacing device (AEROCHAMBER MV) Spcr 1 each by Miscellaneous route daily.    losartan (COZAAR) 25 MG tablet Take 1 tablet (25 mg total) by mouth daily.    magnesium oxide (MAG-OX) 400 mg (241.3 mg elemental magnesium) tablet Take 1 tablet (400 mg total) by mouth daily.    metFORMIN (GLUCOPHAGE) 500 MG tablet Take 1 tablet (500 mg total) by mouth in the morning and 1 tablet (500 mg total) in the evening. Take with meals.    methotrexate 2.5 MG tablet TAKE 8 TABLETS (20 MG TOTAL) BY MOUTH ONCE A WEEK.    multivitamin (TAB-A-VITE/THERAGRAN) per tablet Take 1 tablet by mouth daily.    omeprazole (PRILOSEC) 20 MG capsule TAKE 1 CAPSULE BY MOUTH EVERY DAY IN THE MORNING    tiotropium bromide (SPIRIVA RESPIMAT) 2.5 mcg/actuation inhalation mist Inhale 2 puffs daily.    [DISCONTINUED] umeclidinium-vilanteroL (ANORO ELLIPTA) 62.5-25 mcg/actuation inhaler Inhale 1 puff BY MOUTH daily. For benefit inquiry only. Please send Epic chat message to Sejal K with coverage information, then discontinue Rx.     No current facility-administered medications on file prior to visit.        Review of Systems - HPI    Exam:  LMP  (LMP Unknown)   BP 118/64  - Pulse 65  - Temp 36.8 ??C (98.2 ??F) (Oral)  - Wt (!) 113.9 kg (251 lb 3.2 oz)  - LMP  (LMP Unknown)  - SpO2 90%  - BMI 41.80 kg/m??   Gen: chronically ill appearing in NAD  Tooth upper right, base with exposed dentin, no bleeding or drainage  Is tender        Laural Benes, PA-C  Supervising physician Gabrielle Dare, MD

## 2023-12-15 DIAGNOSIS — K219 Gastro-esophageal reflux disease without esophagitis: Principal | ICD-10-CM

## 2023-12-15 MED ORDER — OMEPRAZOLE 20 MG CAPSULE,DELAYED RELEASE
ORAL_CAPSULE | 4 refills | 0.00 days
Start: 2023-12-15 — End: ?

## 2023-12-16 MED ORDER — OMEPRAZOLE 20 MG CAPSULE,DELAYED RELEASE
ORAL_CAPSULE | Freq: Every day | ORAL | 3 refills | 90.00 days | Status: CP
Start: 2023-12-16 — End: 2024-12-15

## 2023-12-16 NOTE — Unmapped (Signed)
Per interface/mychart refill request.  Patient last seen by PCP on 12/04/23    New Warnings (1 unfiltered, 2 filtered)[] Show filtered (2)  High  Drug-Drug: cefuroxime and omeprazoleAntibiotic efficacy against organisms with a high minimum inhibitory concentration (MIC) to cefpodoxime or cefuroxime could be decreased.  Details  Override reason        omeprazole (PRILOSEC) 20 MG capsule [Pharmacy Med Name: OMEPRAZOLE DR 20 MG CAPSULE]  Prescription. Reordered.  cefuroxime (CEFTIN) 500 MG tabletPrescription. Active.

## 2023-12-20 NOTE — Unmapped (Signed)
[facility] Refill Coordination Note    Specialty Medication(s) to be Shipped:   Inflammatory Disorders: Humira    Other medication(s) to be shipped: No additional medications requested for fill at this time     Cynthia Ramirez, DOB: Jun 07, 1961  Phone: [phone] (home)       All above HIPAA information was verified with patient.     Was a Nurse, learning disability used for this call? No    Completed refill call assessment today to schedule patient's medication shipment from the [facility]  [phone]).  All relevant notes have been reviewed.     Specialty medication(s) and dose(s) confirmed: Regimen is correct and unchanged.   Changes to medications: [name] reports no changes at this time.  Changes to insurance: No  New side effects reported not previously addressed with a pharmacist or physician: None reported  Questions for the pharmacist: No    Confirmed patient received a Conservation officer, historic buildings and a Surveyor, mining with first shipment. The patient will receive a drug information handout for each medication shipped and additional FDA Medication Guides as required.       DISEASE/MEDICATION-SPECIFIC INFORMATION        For patients on injectable medications: Patient currently has 1 doses left.  Next injection is scheduled for 12/30/23.    SPECIALTY MEDICATION ADHERENCE     Medication Adherence    Patient reported X missed doses in the last month: 0  Specialty Medication: HUMIRA(CF) PEN 40 mg/0.4 mL injection (adalimumab)  Patient is on additional specialty medications: No  Patient is on more than two specialty medications: No  Any gaps in refill history greater than 2 weeks in the last 3 months: no  Demonstrates understanding of importance of adherence: yes  Informant: patient              Were doses missed due to medication being on hold? No    HUMIRA(CF) PEN 40 mg/ml: 14 days of medicine on hand       REFERRAL TO PHARMACIST     Referral to the pharmacist: Not needed      [facility]     Shipping address confirmed in Epic.       Delivery Scheduled: Yes, Expected medication delivery date: 12/26/23.     Medication will be delivered via Same Day Courier to the prescription address in Epic WAM.    Moshe Salisbury   [facility]  Specialty Technician

## 2023-12-26 NOTE — Unmapped (Signed)
Cynthia Ramirez 's Humira shipment will be delayed as a result of a high copay.     I have reached out to the patient  via MyChart message and communicated the delay. We will wait for a call back from the patient to reschedule the delivery.  We have not confirmed the new delivery date.

## 2023-12-27 MED FILL — HUMIRA PEN CITRATE FREE 40 MG/0.4 ML: SUBCUTANEOUS | 28 days supply | Qty: 2 | Fill #3

## 2024-01-03 MED ORDER — FOLIC ACID 1 MG TABLET
ORAL_TABLET | Freq: Every day | ORAL | 3 refills | 90.00 days | Status: CP
Start: 2024-01-03 — End: ?

## 2024-01-03 NOTE — Unmapped (Signed)
Folic acid refill  Last Visit Date: 09/19/2023  Next Visit Date: 02/04/2024

## 2024-01-14 ENCOUNTER — Ambulatory Visit: Admit: 2024-01-14 | Discharge: 2024-01-15 | Payer: BLUE CROSS/BLUE SHIELD

## 2024-01-14 DIAGNOSIS — Z1211 Encounter for screening for malignant neoplasm of colon: Principal | ICD-10-CM

## 2024-01-14 DIAGNOSIS — M0579 Rheumatoid arthritis with rheumatoid factor of multiple sites without organ or systems involvement: Principal | ICD-10-CM

## 2024-01-14 DIAGNOSIS — J449 Chronic obstructive pulmonary disease, unspecified: Principal | ICD-10-CM

## 2024-01-14 DIAGNOSIS — E119 Type 2 diabetes mellitus without complications: Principal | ICD-10-CM

## 2024-01-14 DIAGNOSIS — I5032 Chronic diastolic (congestive) heart failure: Principal | ICD-10-CM

## 2024-01-14 MED ORDER — SEMAGLUTIDE (WEIGHT LOSS) 1 MG/0.5 ML SUBCUTANEOUS PEN INJECTOR
SUBCUTANEOUS | 0 refills | 0.00 days | Status: CP
Start: 2024-01-14 — End: ?

## 2024-01-14 MED ORDER — SEMAGLUTIDE (WEIGHT LOSS) 0.25 MG/0.5 ML SUBCUTANEOUS PEN INJECTOR
SUBCUTANEOUS | 0 refills | 0.00 days | Status: CP
Start: 2024-01-14 — End: ?

## 2024-01-14 MED ORDER — SEMAGLUTIDE (WEIGHT LOSS) 0.5 MG/0.5 ML SUBCUTANEOUS PEN INJECTOR
SUBCUTANEOUS | 0 refills | 0.00 days | Status: CP
Start: 2024-01-14 — End: ?

## 2024-01-14 MED ORDER — DIPHTH,PERTUSSIS(ACEL),TETANUS 2.5 LF UNIT-8 MCG-5 LF/0.5ML IM SYRINGE
Freq: Once | INTRAMUSCULAR | 0 refills | 1.00 days | Status: CP
Start: 2024-01-14 — End: 2024-01-14

## 2024-01-14 NOTE — Unmapped (Addendum)
Will prescribe wegovy to treat weight and it is indicated for your history of heart failure with normal ejection fraction.     You have to call to schedule colonoscopy: [phone] order is in.    Tdap at pharmacy        [facility]  [address]  [place], [place] [address]  Phone number: [phone]  Fax: [phone]    [facility] clinic:   [address]  [place], [place] [address]  Phone number [phone]    It's after 5:00pm during the week or it's the weekend. How do I get medical attention?  Call the 24/7 Nursing Line [phone] to get nurse advice  Or [facility] walk-in clinic at [address], [place], [place]; [phone]; 7 days a week from 9:00AM - 8:00PM or  [facility] at [facility]    Use of mychart messaging:  -Please feel free to use mychart to ask follow up questions from your visit.   - Mychart messages are not reviewed at nights or weekends so please do not send a message that needs immediate action. You can call the 24/7 nurse line.   -Please allow 72 hours for a response to your mychart message  -Mychart messages are reviewed and triaged by our nursing staff to help you get the most streamlined care.    -I do not see every mychart message-- if there is something you definitely want for me to see you can ask that in the message but do not delay care waiting for me!

## 2024-01-14 NOTE — Unmapped (Signed)
[facility] Clinic Visit    Reason for Visit: Follow up HFpEF and diabetes    A/P:    Assessment & Plan  Chronic diastolic heart failure (CMS-HCC)  HFpEF: semaglutide is indicated. She also has BMI 42 and DM.   Start semaglutide (wegovy or ozempic based on formulary)      Type 2 diabetes mellitus without complication, without long-term current use of insulin (CMS-HCC)  Well controlled without medications. Continue to monitor.   Semaglutdie for weight management.       Rheumatoid arthritis involving multiple sites with positive rheumatoid factor (CMS-HCC)  Stable on mt and humira. Some thumb pain but no swelling.            Chronic obstructive pulmonary disease, unspecified COPD type (CMS-HCC)  Stable off of oxygen. Congratulations on no smoking. Continue to use Inahlers.          Colon cancer screening    Orders:    Colonoscopy; Future    Health maintenence:   Tdap- will [facility]    Return in about 4 months (around 05/13/2024).      __________________________________________________________    HPI:    HFpEF with obesity and DM   Tried metformin 500 mg twice daily: added gas and diarrhea causing accidents. Did not impact her weight.     Lab Results   Component Value Date    A1C 5.8 10/01/2023        MVI, D3, folic acid, magnesium    Black stool. No pepto, NSAIDs    RA: methotrexate, humira    COPD: breathing feels fine. Adviar and spiriva. Not needing oxygen. RA sats not less than 90%.   No shortness of breath.     OHS/OSA: using cpap.     HFpEF:  Has gained weight but no edema. Monitinrng weight daily and uses furosemide if weight > 3lbs overnight. Rarely needing.     __________________________________________________________    Problem List:  Patient Active Problem List   Diagnosis    Tobacco use disorder    Family history of diabetes mellitus (DM)    Lumbar radiculopathy    Rheumatoid arthritis involving multiple sites with positive rheumatoid factor (CMS-HCC)    COPD (chronic obstructive pulmonary disease) (CMS-HCC)    Diabetes mellitus, type 2 (CMS-HCC)    Chronic diastolic heart failure (CMS-HCC)    Subcapital fracture of neck of right femur (CMS-HCC)    Caregiver stress       Medications:  Reviewed in EPIC    ROS: remainder of review of systems negative  __________________________________________________________    Physical Exam:   Vital Signs:  Vitals:    01/14/24 1406   BP: 128/65   BP Site: L Arm   BP Position: Sitting   BP Cuff Size: Medium   Pulse: 65   Resp: 18   Temp: 36.9 ??C (98.4 ??F)   TempSrc: Temporal   SpO2: 96%   Weight: (!) 114.9 kg (253 lb 6.4 oz)   Height: 165.1 cm (5' 5)      Wt Readings from Last 3 Encounters:   01/14/24 (!) 114.9 kg (253 lb 6.4 oz)   12/04/23 (!) 113.9 kg (251 lb 3.2 oz)   10/29/23 (!) 108.9 kg (240 lb)     Body mass index is 42.17 kg/m??.    Gen: Well appearing, NAD  CV: RRR, no murmurs  Pulm: CTA bilaterally, no crackles or wheezes  Ext: Thumb without effusion or synovitis.

## 2024-01-14 NOTE — Unmapped (Signed)
[facility]     Reason for visit: Follow up    Questions / Concerns that need to be addressed:     Screening BP 128/65 p 65    Omron BPs (complete if screening BP has a systolic  > 130 or diastolic > 80)  BP#1    BP#2   BP#3     Average BP   (please note this as a comment in vitals)     PTHomeBP         HCDM reviewed and updated in Epic:    We are working to make sure all of our patients??? wishes are updated in Epic and part of that is documenting a Environmental health practitioner for each patient  A Health Care Decision Maker is someone you choose who can make health care decisions for you if you are not able - who would you most want to do this for you????  was updated.    HCDM (patient stated preference): [place], [place] - Brother - [phone]    HCDM, back-up (If primary HCDM is unavailable): Kirchner,Virgina - Sister - [phone]    __________________________________________________________________________________________    SCREENINGS COMPLETED IN FLOWSHEETS      AUDIT       PHQ2       PHQ9          GAD7       COPD Assessment       Falls Risk

## 2024-01-14 NOTE — Unmapped (Addendum)
Well controlled without medications. Continue to monitor.   Semaglutdie for weight management.

## 2024-01-14 NOTE — Unmapped (Addendum)
Stable off of oxygen. Congratulations on no smoking. Continue to use Inahlers.

## 2024-01-14 NOTE — Unmapped (Addendum)
Stable on mt and humira. Some thumb pain but no swelling.

## 2024-01-14 NOTE — Unmapped (Addendum)
HFpEF: semaglutide is indicated. She also has BMI 42 and DM.   Start semaglutide (wegovy or ozempic based on formulary)

## 2024-01-15 DIAGNOSIS — M0579 Rheumatoid arthritis with rheumatoid factor of multiple sites without organ or systems involvement: Principal | ICD-10-CM

## 2024-01-15 MED ORDER — HUMIRA PEN CITRATE FREE 40 MG/0.4 ML
SUBCUTANEOUS | 1 refills | 56.00 days
Start: 2024-01-15 — End: ?

## 2024-01-15 NOTE — Unmapped (Signed)
[facility] Clinical Assessment & Refill Coordination Note    Patient states they were supposed to move to q7d dosing.  Previous notes show that this may have needed an appeal approval.  Sent message to MD to send q7d RX so we can test and see if it was approved by insurance.     Cynthia Ramirez, DOB: 12-12-1961  Phone: [phone] (home)     All above HIPAA information was verified with patient.     Was a Nurse, learning disability used for this call? No    Specialty Medication(s):   Inflammatory Disorders: Humira     Current Outpatient Medications   Medication Sig Dispense Refill    acetaminophen (TYLENOL) 325 MG tablet Take 2 tablets (650 mg total) by mouth two (2) times a day as needed.      atorvastatin (LIPITOR) 40 MG tablet Take 1 tablet (40 mg total) by mouth daily. 90 tablet 3    baclofen (LIORESAL) 10 MG tablet Take 1 tablet (10 mg total) by mouth Three (3) times a day. 30 tablet 1    cholecalciferol, vitamin D3-50 mcg, 2,000 unit,, 50 mcg (2,000 unit) tablet Take 1 tablet (50 mcg total) by mouth daily. 2 tablets daily      fluticasone propion-salmeterol (ADVAIR HFA) 230-21 mcg/actuation inhaler Inhale 2 puffs  in the morning and 2 puffs in the evening. 36 g 3    folic acid (FOLVITE) 1 MG tablet TAKE 1 TABLET BY MOUTH EVERY DAY 90 tablet 3    furosemide (LASIX) 40 MG tablet Take 1 tablet (40 mg total) by mouth daily. Take one tablet daily as needed for weight gain (goal weight ~248-251lbs) (Patient taking differently: Take 0.5 tablets (20 mg total) by mouth daily. Take one tablet daily as needed for edema. Do not administer if SBP<100.)  0    HUMIRA PEN CITRATE FREE 40 MG/0.4 ML Inject the contents of 1 pen (40 mg total) under the skin every fourteen (14) days. 4 each 1    inhalational spacing device (AEROCHAMBER MV) Spcr 1 each by Miscellaneous route daily. 1 each 0    losartan (COZAAR) 25 MG tablet Take 1 tablet (25 mg total) by mouth daily. 90 tablet 3    magnesium oxide (MAG-OX) 400 mg (241.3 mg elemental magnesium) tablet Take 1 tablet (400 mg total) by mouth daily. 90 tablet 3    methotrexate 2.5 MG tablet TAKE 8 TABLETS (20 MG TOTAL) BY MOUTH ONCE A WEEK. 96 tablet 3    multivitamin (TAB-A-VITE/THERAGRAN) per tablet Take 1 tablet by mouth daily.      omeprazole (PRILOSEC) 20 MG capsule Take 1 capsule (20 mg total) by mouth daily. 90 capsule 3    semaglutide, weight loss, (WEGOVY) 0.25 mg/0.5 mL injection pen Inject 0.25 mg under the skin every seven (7) days. 2 mL 0    semaglutide, weight loss, (WEGOVY) 0.5 mg/0.5 mL injection pen Inject 0.5 mg under the skin every seven (7) days. 2 mL 0    semaglutide, weight loss, (WEGOVY) 1 mg/0.5 mL injection pen Inject 1 mg under the skin every seven (7) days. 2 mL 0    tiotropium bromide (SPIRIVA RESPIMAT) 2.5 mcg/actuation inhalation mist Inhale 2 puffs daily. 16 g 11     No current facility-administered medications for this visit.        Changes to medications: Kerri reports no changes at this time.    Medication list has been reviewed and updated in Epic: Yes  Allergies   Allergen Reactions    Doxycycline Hcl Hives    Shellfish Containing Products Swelling and Other (See Comments)     Swelling of lips, no swelling of tongue or difficulty breathing.    Swelling of lips, no swelling of tongue or difficulty breathing.UnknownUnknownSwelling of lips, no swelling of tongue or difficulty breathing. Unknown    Swelling of lips, no swelling of tongue or difficulty breathing.   Unknown   Unknown   Swelling of lips, no swelling of tongue or difficulty breathing.    Sulfa (Sulfonamide Antibiotics) Other (See Comments) and Nausea Only     Stomach issue       Changes to allergies: No    Allergies have been reviewed and updated in Epic: Yes    SPECIALTY MEDICATION ADHERENCE         Medication Adherence    Patient reported X missed doses in the last month: 0  Specialty Medication: Humira q14d  Patient is on additional specialty medications: No  Informant: patient          Specialty medication(s) dose(s) confirmed: Regimen is correct and unchanged.     Are there any concerns with adherence? No    Adherence counseling provided? Not needed    CLINICAL MANAGEMENT AND INTERVENTION      Clinical Benefit Assessment:    Do you feel the medicine is effective or helping your condition? Yes    Clinical Benefit counseling provided? Progress note from 09/19/23 shows evidence of clinical benefit    Adverse Effects Assessment:    Are you experiencing any side effects? No    Are you experiencing difficulty administering your medicine? No    Quality of Life Assessment:    Quality of Life    Rheumatology  1. What impact has your specialty medication had on the reduction of your daily pain level?: Some  2. What impact has your specialty medication had on your ability to complete daily tasks (prepare meals, get dressed, etc...)?: Some  Oncology  Dermatology  Cystic Fibrosis          How many days over the past month did your RA  keep you from your normal activities? For example, brushing your teeth or getting up in the morning. Still affects her regularly     Have you discussed this with your provider? Not needed    Acute Infection Status:    Acute infections noted within Epic:  No active infections  Patient reported infection: None    Therapy Appropriateness:    Is therapy appropriate based on current medication list, adverse reactions, adherence, clinical benefit and progress toward achieving therapeutic goals? Yes, therapy is appropriate and should be continued     DISEASE/MEDICATION-SPECIFIC INFORMATION      For patients on injectable medications: Patient currently has 1 doses left.  Next injection is scheduled for 2/10.    Chronic Inflammatory Diseases: Have you experienced any flares in the last month? No    PATIENT SPECIFIC NEEDS     Does the patient have any physical, cognitive, or cultural barriers? No    Is the patient high risk? No    Did the patient require a clinical intervention? No    Does the patient require physician intervention or other additional services (i.e., nutrition, smoking cessation, social work)? No    Does the patient have an additional or emergency contact listed in their chart? Yes    SOCIAL DETERMINANTS OF HEALTH     At the [facility],  we have learned that life circumstances - like trouble affording food, housing, utilities, or transportation can affect the health of many of our patients.   That is why we wanted to ask: are you currently experiencing any life circumstances that are negatively impacting your health and/or quality of life? No    Social Drivers of Health     Food Insecurity: Food Insecurity Present (06/10/2023)    Hunger Vital Sign     Worried About Running Out of Food in the Last Year: Sometimes true     Ran Out of Food in the Last Year: Sometimes true   Internet Connectivity: Possible Internet connectivity concern identified (11/07/2022)    Internet Connectivity     Do you have access to internet services: Yes     How do you connect to the internet: Personal Device at home     Is your internet connection strong enough for you to watch video on your device without major problems?: Patient Declined     Do you have enough data to get through the month?: No     Does at least one of the devices have a camera that you can use for video chat?: Yes   Housing/Utilities: High Risk (06/10/2023)    Housing/Utilities     Within the past 12 months, have you ever stayed: outside, in a car, in a tent, in an overnight shelter, or temporarily in someone else's home (i.e. couch-surfing)?: No     Are you worried about losing your housing?: Yes     Within the past 12 months, have you been unable to get utilities (heat, electricity) when it was really needed?: Yes   Tobacco Use: Medium Risk (01/14/2024)    Patient History     Smoking Tobacco Use: Former     Smokeless Tobacco Use: Never     Passive Exposure: Past   Transportation Needs: Unmet Transportation Needs (06/10/2023)    PRAPARE - Therapist, art (Medical): Yes     Lack of Transportation (Non-Medical): Yes   Alcohol Use: Not At Risk (06/10/2023)    Alcohol Use     How often do you have a drink containing alcohol?: Never     How many drinks containing alcohol do you have on a typical day when you are drinking?: 1 - 2     How often do you have 5 or more drinks on one occasion?: Never   Interpersonal Safety: Not At Risk (06/10/2023)    Interpersonal Safety     Unsafe Where You Currently Live: No     Physically Hurt by Anyone: No     Abused by Anyone: No   Physical Activity: Inactive (06/10/2023)    Exercise Vital Sign     Days of Exercise per Week: 0 days     Minutes of Exercise per Session: 0 min   Intimate Partner Violence: Not At Risk (06/10/2023)    Humiliation, Afraid, Rape, and Kick questionnaire     Fear of Current or Ex-Partner: No     Emotionally Abused: No     Physically Abused: No     Sexually Abused: No   Stress: Stress Concern Present (06/10/2023)    [employer] of Occupational Health - Occupational Stress Questionnaire     Feeling of Stress : To some extent   Substance Use: Low Risk  (06/10/2023)    Substance Use     In the past year, how often have you used prescription drugs  for non-medical reasons?: Never     In the past year, how often have you used illegal drugs?: Never     In the past year, have you used any substance for non-medical reasons?: No   Social Connections: Socially Isolated (06/10/2023)    Social Connection and Isolation Panel [NHANES]     Frequency of Communication with Friends and Family: Never     Frequency of Social Gatherings with Friends and Family: Never     Attends Religious Services: Never     Database administrator or Organizations: No     Attends Banker Meetings: Never     Marital Status: Living with partner   Physicist, medical Strain: High Risk (06/10/2023)    Overall Financial Resource Strain (CARDIA)     Difficulty of Paying Living Expenses: Very hard   Depression: Not at risk (06/10/2023)    PHQ-2     PHQ-2 Score: 1   Health Literacy: Low Risk  (06/10/2023)    Health Literacy     : Never       Would you be willing to receive help with any of the needs that you have identified today? Not applicable       SHIPPING     Specialty Medication(s) to be Shipped:   Inflammatory Disorders: Humira    Other medication(s) to be shipped: No additional medications requested for fill at this time     Changes to insurance: No    Delivery Scheduled: Yes, Expected medication delivery date: 2/3.  However, Rx request for refills was sent to the provider as there are none remaining.     Medication will be delivered via Same Day Courier to the confirmed prescription address in [facility].    The patient will receive a drug information handout for each medication shipped and additional FDA Medication Guides as required.  Verified that patient has previously received a Conservation officer, historic buildings and a Surveyor, mining.    The patient or caregiver noted above participated in the development of this care plan and knows that they can request review of or adjustments to the care plan at any time.      All of the patient's questions and concerns have been addressed.    Julianne Rice, PharmD   [facility] Specialty Pharmacist

## 2024-01-16 DIAGNOSIS — M0579 Rheumatoid arthritis with rheumatoid factor of multiple sites without organ or systems involvement: Principal | ICD-10-CM

## 2024-01-16 MED ORDER — OZEMPIC 0.25 MG OR 0.5 MG (2 MG/3 ML) SUBCUTANEOUS PEN INJECTOR
SUBCUTANEOUS | 1 refills | 56.00 days | Status: CP
Start: 2024-01-16 — End: 2024-03-12

## 2024-01-16 MED ORDER — HUMIRA PEN CITRATE FREE 40 MG/0.4 ML
SUBCUTANEOUS | 11 refills | 28.00 days | Status: CP
Start: 2024-01-16 — End: ?
  Filled 2024-01-20: qty 4, 28d supply, fill #0

## 2024-01-16 NOTE — Unmapped (Signed)
Clinical Assessment Needed For: Dose Change  Medication: HUMIRA(CF) PEN 40 mg/0.4 mL injection (adalimumab)  Last Fill Date/Day Supply: 12/27/23 / 28 days  Copay $525.03- has [employer] debit card to cover balance  Was previous dose already scheduled to fill: Yes    Notes to Pharmacist: scheduled for 01/20/24 (SD)

## 2024-01-16 NOTE — Unmapped (Signed)
[facility] Pharmacist has reviewed a new prescription for Humira that indicates a dose increase.  Patient was counseled on this dosage change by Dr. Berton Lan - see epic note from 1/29.  Next refill call date adjusted if necessary.

## 2024-01-17 NOTE — Unmapped (Deleted)
Colonoscopy  Procedure #1     Procedure #2   [id number]  MRN     Endoscopist     Is the patient's health insurance [employer], [place], [facility]), [employer] [place], [employer], or [place]?     Urgent procedure     Are you pregnant?     Are you in the process of scheduling or awaiting results of a heart ultrasound, stress test, or catheterization to evaluate new or worsening chest pain, dizziness, or shortness of breath?     Do you take: Plavix (clopidogrel), Coumadin (warfarin), Lovenox (enoxaparin), Pradaxa (dabigatran), Effient (prasugrel), Xarelto (rivaroxaban), Eliquis (apixaban), Pletal (cilostazol), or Brilinta (ticagrelor)?          Did ordering provider indicate how long to hold this medication in the order comments?          Which of the above medications are you taking?          What is the name of the medical practice that manages this medication?          What is the name of the medical provider who manages this medication?     Do you have hemophilia, von Willebrand disease, or low platelets?     Do you have a pacemaker or implanted cardiac defibrillator?     Has a [facility] provider specified the location(s)?     Which location(s) did the [facility] provider specify?          [facility]          [place]          [facility]     Do you see a liver specialist for chronic liver disease?     Is the procedure indication for variceal screening?     Is procedure indication for variceal banding (this does NOT include variceal screening)?     Have you had a heart attack, stroke or heart stent placement within the past 6 months?     Month of event     Year of event (ONLY ENTER LAST 2 DIGITS)        5  Height (feet)   6  Height (inches)   252  Weight (pounds)   40.7  BMI          Did the ordering provider specify a bowel prep?          What bowel prep was specified?     Do you have an ostomy (bag on your stomach that collects your stool)?          Is it an ileostomy?          Is it a colostomy? Patient doesn't know.     Do you have chronic kidney disease?     Do you have chronic constipation or have you had poor quality bowel preps for past colonoscopies?     Do you have Crohn's disease or ulcerative colitis?     Have you had weight loss surgery?        TRUE  When you walk around your house or grocery store, do you have to stop and rest due to shortness of breath, chest pain, or light-headedness?     Do you ever use supplemental oxygen?     Have you been hospitalized for cirrhosis of the liver or heart failure in the last 12 months?     Have you been treated for mouth or throat cancer with radiation or surgery?  Have you been told that it is difficult for doctors to insert a breathing tube in you during anesthesia?     Have you had a heart or lung transplant?          Are you on dialysis?     Do you have cirrhosis of the liver?     Do you have myasthenia gravis?     Is the patient a prisoner?   ################# ## ###################################################################################################################   MRN:  [id number]   Anticoag Review  No   Nurse Triage  No   GI clinic consult  No   Procedure(s):  Colonoscopy     0   Endoscopist:  0   Urgent:  No   Prep:  Nulytely Prep                  --------------------------- --- ----------------------------------------------------------------------------------------------------------------------------------------------------------------------------   [facility] Locations:  [facility]                  Requested Locations:              ################# ## ###################################################################################################################

## 2024-01-17 NOTE — Unmapped (Signed)
Colonoscopy  Procedure #1     Procedure #2   [id number]  MRN     Endoscopist     Is the patient's health insurance [employer], [employer], [facility]), [employer] [facility] [place], [employer], or [place]?     Urgent procedure     Are you pregnant?     Are you in the process of scheduling or awaiting results of a heart ultrasound, stress test, or catheterization to evaluate new or worsening chest pain, dizziness, or shortness of breath?     Do you take: Plavix (clopidogrel), Coumadin (warfarin), Lovenox (enoxaparin), Pradaxa (dabigatran), Effient (prasugrel), Xarelto (rivaroxaban), Eliquis (apixaban), Pletal (cilostazol), or Brilinta (ticagrelor)?          Did ordering provider indicate how long to hold this medication in the order comments?          Which of the above medications are you taking?          What is the name of the medical practice that manages this medication?          What is the name of the medical provider who manages this medication?     Do you have hemophilia, von Willebrand disease, or low platelets?     Do you have a pacemaker or implanted cardiac defibrillator?     Has a [facility] provider specified the location(s)?     Which location(s) did the [facility] provider specify?          [facility]          [place]          [facility]     Do you see a liver specialist for chronic liver disease?     Is the procedure indication for variceal screening?     Is procedure indication for variceal banding (this does NOT include variceal screening)?     Have you had a heart attack, stroke or heart stent placement within the past 6 months?     Month of event     Year of event (ONLY ENTER LAST 2 DIGITS)        5  Height (feet)   6  Height (inches)   252  Weight (pounds)   40.7  BMI          Did the ordering provider specify a bowel prep?          What bowel prep was specified?     Do you have an ostomy (bag on your stomach that collects your stool)?          Is it an ileostomy?          Is it a colostomy? Patient doesn't know.     Do you have chronic kidney disease?     Do you have chronic constipation or have you had poor quality bowel preps for past colonoscopies?     Do you have Crohn's disease or ulcerative colitis?     Have you had weight loss surgery?          When you walk around your house or grocery store, do you have to stop and rest due to shortness of breath, chest pain, or light-headedness?     Do you ever use supplemental oxygen?     Have you been hospitalized for cirrhosis of the liver or heart failure in the last 12 months?     Have you been treated for mouth or throat cancer with radiation or surgery?  Have you been told that it is difficult for doctors to insert a breathing tube in you during anesthesia?     Have you had a heart or lung transplant?          Are you on dialysis?     Do you have cirrhosis of the liver?     Do you have myasthenia gravis?     Is the patient a prisoner?   ################# ## ###################################################################################################################   MRN:  [id number]   Anticoag Review  No   Nurse Triage  No   GI clinic consult  No   Procedure(s):  Colonoscopy     0   Endoscopist:  0   Urgent:  No   Prep:  Nulytely Prep                  --------------------------- --- ----------------------------------------------------------------------------------------------------------------------------------------------------------------------------   [facility] Locations:  [facility]     [facility]     [place]        Requested Locations:              ################# ## ###################################################################################################################

## 2024-01-17 NOTE — Unmapped (Signed)
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 Currently, lesion is very small and appears scarred down. While typically drainage does not lead to permanent resolution of these lesions, the fact that hers has not refilled in over a month since it drained and clinical exam today consistent with scar without discernible cystic lesion may increase the likelihood that her lesion may not refill and may remain in this state indefinitely.  - Joint decision made to defer surgical procedure. Patient will notify us if the lesion becomes larger or bothersome (it was previously tender under her CPAP mask strap), at which point we can proceed with I&D or excision at that time.    The patient was advised to call for an appointment should any new, changing, or symptomatic lesions develop.     RTC: Return if symptoms worsen or fail to improve. or sooner as needed     Chief Complaint     Chief Complaint   Patient presents with    Procedure     Pt c/o excision on left cheek.        HPI     Cynthia Ramirez is a 63 y.o. female who presents as a returning patient (last seen 08/05/2023) [facility] for a lesion of concern.    Reports that cyst that had been present on left cheek for over a year became inflamed and then drained before Christmas. Since then, it has become quite small and is now asymptomatic. The area is no longer tender when she wears her CPAP and is not noticeable in appearance. She wonders whether surgical excision is necessary today.    The patient denies any other new or changing lesions or areas of concern.     Pertinent Past Medical History     Past Medical History, Family History, Social History, Medication List, Allergies, and Problem List were reviewed in the rooming section of Epic.     ROS: Other than symptoms mentioned in the HPI, no fevers, chills, or other skin complaints.    Physical Examination     GENERAL: Well-appearing female in no acute distress, resting comfortably.  NEURO: Alert and oriented, answers questions appropriately  PSYCH: Normal mood and affect  RESP: No increased work of breathing  SKIN (Focal Skin Exam): Per patient request, examination of left cheek was performed    0.71mm firm subcutaneous nodule with overlying scar on the left cheek. No obvious central punctum.    All areas not commented on are within normal limits or unremarkable

## 2024-01-20 ENCOUNTER — Ambulatory Visit
Admit: 2024-01-20 | Discharge: 2024-01-21 | Payer: BLUE CROSS/BLUE SHIELD | Attending: Student in an Organized Health Care Education/Training Program | Primary: Student in an Organized Health Care Education/Training Program

## 2024-01-20 DIAGNOSIS — L720 Epidermal cyst: Principal | ICD-10-CM

## 2024-01-20 NOTE — Unmapped (Signed)
Addended by: Inis Sizer A on: 01/20/2024 12:22 PM     Modules accepted: Level of Service

## 2024-01-21 MED ORDER — OZEMPIC 0.25 MG OR 0.5 MG (2 MG/3 ML) SUBCUTANEOUS PEN INJECTOR
SUBCUTANEOUS | 1 refills | 56.00 days | Status: CP
Start: 2024-01-21 — End: 2024-03-17

## 2024-01-21 NOTE — Unmapped (Signed)
pharmacy needed initial rx resubmitted now that insurance will cover med

## 2024-01-24 MED ORDER — PEG 3350-ELECTROLYTES 236 GRAM-22.74 GRAM-6.74 GRAM-5.86 GRAM SOLUTION
Freq: Once | ORAL | 0 refills | 1.00 days | Status: CP
Start: 2024-01-24 — End: 2024-01-24

## 2024-01-29 NOTE — Unmapped (Signed)
PA not required.

## 2024-02-03 NOTE — Unmapped (Signed)
 RHEUMATOLOGY OUTPATIENT FOLLOWUP VISIT    Assessment/Plan:  Cynthia Ramirez is a 63 y.o. 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 Taking methotrexate and Humira every 7 days.  Better control of RA with this combination.     Assessment/Plan:     Rheumatoid Arthritis  Chronic condition with elbow pain, knee stiffness, and thumb tendon issues. Improvement in nocturnal throbbing since starting weekly Humira weekly. 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 Thumb popping likely due to tendon issues. Vision problems unlikely to be related to Humira or methotrexate.  A lateral epicondylitis band can support the tendon and alleviate pain. Referral to a hand therapist for a custom brace due to difficulty finding a suitable off-the-shelf brace for her right wrist.  - Continue weekly Humira  - Continue methotrexate 20 mg po weekly  - Refer to occupational therapy for custom wrist brace  - Order blood work for methotrexate monitoring  - Refer to hand therapist for thumb tendon issue  - Recommend using a lateral epicondylitis band for elbow pain    Obesity  On Ozempic to aid in weight loss to reduce joint pain. Difficulty with injection process and no significant reduction in hunger yet. Dose will be increased gradually to improve efficacy. Emphasized importance of weight loss for reducing lower extremity joint pain.  - Continue Ozempic  - Monitor weight and adjust dosage as needed    Follow-up  - Schedule follow-up appointment in six months.          Immunization History   Administered Date(s) Administered Comments    COVID-19 VAC,BIVALENT(50YR UP),PFIZER 10/10/2021      04/11/2022     COVID-19 VACC,MRNA,(PFIZER)(PF) 03/11/2020 Per [facility]     04/01/2020 Per [facility]     09/22/2020     Covid-19 Vac, (32yr+) (Comirnaty) Mrna Pfizer  09/19/2023     Covid-19 Vac, (12yr+) (Spikevax) Monovalent Moderna 10/23/2022     INFLUENZA VACCINE IIV3(IM)(PF)6 MOS UP 09/19/2023 Influenza Vaccine Quad(IM)6 MO-Adult(PF) 09/11/2018      09/05/2019      09/29/2020      10/10/2021      10/03/2022     Influenza Virus Vaccine, unspecified formulation 03/11/2017      09/16/2019 [facility]    PNEUMOCOCCAL POLYSACCHARIDE 23-VALENT 09/29/2020     Pneumococcal Conjugate 13-Valent 05/17/2016     RSV VACCINE, BIVALENT(PF)(ABRYSVO) 11/14/2022     TdaP 09/20/2008 [facility]       HPI: Cynthia Ramirez is a 63 y.o.  female whom I met via video in March 2021 for a new patient encounter for the evaluation of erosive rheumatoid arthritis RF 130, CCP >250 . Previously followed at [facility] and last seen there in 2018. Previous treatment with methotrexate though she admits not taking it very often. She was not interested in biologics but they were discussed. She does not like to take medications. Her symptoms started after she took a bad fall on the job in 2016. She jammed her left hand. No fractures. She spent a year seeing physicians about the knee injury and eventually had surgery for a torn meniscus. She limped around for a year. She developed sciatica and worked with a Land which helped. She has the sciatic issue again which she thinks may be from coughing from her COPD.The top of her foot has a bony protuberance. She went to a podiatrist who gave her inserts which did not help. Her left 3rd PIP joint remains swollen. I  checked xrays of her hands and feet and she has no erosive disease. She is wary of taking medications and given the fact that she has no erosions, we decided at last visit to try HCQ 400 mg daily. CRP was 36, ESR normal.  Tried arava but it did not help.  She was admitted in February for volume overload. Acute on chronic hypoxic respiratory failure 2/2 probable viral pneumonia and HFpEF. During her hospitalization she spent all of her time in the ICU in bed. During that time her joint pain got extremely bad. Now involves her shoulders, knees in addition to wrists and ankles. She developed acute left knee pain about 10 days ago. No trauma. She tried elevating her legs, used compression socks. It is better now. Pulmonary rehab has been prescribed but she cannot walk. She is taking aleve alternating with tylenol. Aleve 2 every 12 hours. Having a very difficult time getting up out of a chair due to both weakness and pain.   Xray of the left knee shows Tricompartmental joint space narrowing osteophyte formation most proximal in the medial and lateral compartments. Moderate size joint effusion without layering fat fluid. Soft tissues are within normal limits.    Interim History: Last seen in October.   Cynthia Ramirez is a 63 year old female with rheumatoid arthritis who presents with joint pain and stiffness. She is accompanied by her niece.    She experiences joint pain and stiffness, primarily in her left elbow and right knee. The stiffness in her knee feels like it 'won't move,' and she notes a sensation of her thumb 'popping out of place' when making a fist. No swelling or heat in her joints is reported.    She is currently on weekly Humira injections, which have alleviated her nighttime throbbing pain. She also takes methotrexate and has recently started Ozempic injections twice a week for weight management. She finds the Ozempic injections more challenging due to the visible needle.    Elbow pain has been present for a couple of months, particularly noticeable when using her walker. She describes a grinding sensation in her shoulder, which she wonders might be related to her elbow pain. No trouble moving the elbow is reported, although pain is present.    Her thumb feels like it is 'popping out of place' when making a fist, suggesting possible tendon subluxation.    Her knee feels numb from the lower to the upper part, but it does not feel like it will give out. No heat is reported in the knee area.    She reports worsening vision, which she plans to discuss with her eye doctor, suspecting it may be related to her medications.          MRI of the abdomen:   1.1 cm indeterminate left renal lesion, as above, incompletely characterized on single phase CT and new compared with 2018. Recommend short interval follow-up with MRI of the abdomen with and without contrast to evaluate for interval stability and characterization.      Subacute/chronic splenic infarct, new compared with 02/11/2017.      Hepatic lesions consistent with cysts/biliary versus biliary hamartomas, not substantially changed compared with 2018.     Allergies:  Doxycycline hcl, Shellfish containing products, and Sulfa (sulfonamide antibiotics)    Problem List  Patient Active Problem List   Diagnosis    Tobacco use disorder    Family history of diabetes mellitus (DM)    Lumbar radiculopathy    Rheumatoid  arthritis involving multiple sites with positive rheumatoid factor (CMS-HCC)    COPD (chronic obstructive pulmonary disease) (CMS-HCC)    Diabetes mellitus, type 2 (CMS-HCC)    Chronic diastolic heart failure (CMS-HCC)    Subcapital fracture of neck of right femur (CMS-HCC)    Caregiver stress      Medical History:  Past Medical History:   Diagnosis Date    Abnormal mammogram     At risk for falls     Cataract last eye appointment showed lite catact    Cognitive impairment     has a hard time remembering    COPD (chronic obstructive pulmonary disease) (CMS-HCC)     COPD (chronic obstructive pulmonary disease) (CMS-HCC) 03/06/2018    Diabetes mellitus (CMS-HCC)     Dizziness     Emphysema of lung (CMS-HCC) see [place] chart for info    see [place] chart for info    Essential hypertension, benign     Family history of diabetes mellitus (DM)     Financial difficulties     Hearing impairment     has a hole in her eardrum    HL (hearing loss)     Hyperlipidemia     in my chart    Impaired mobility     Lack of access to transportation     Nonadherence to medication     Obesity yes over weight    Problem with transportation     Rheumatoid arthritis (CMS-HCC) 2017    Marked elevation of rheumatoid factor 130.5  and CCP>250    Tobacco use disorder     Visual impairment     hard to see per patient     Surgical History:  Past Surgical History:   Procedure Laterality Date    BREAST BIOPSY Left 02/2016    HYSTERECTOMY      TAH before age 61    INNER EAR SURGERY Bilateral     2004 and earlier -- unspecified nature of surgery    PILONIDAL CYST DRAINAGE      PR COLONOSCOPY FLX DX W/COLLJ SPEC WHEN PFRMD N/A 03/13/2016    Procedure: COLONOSCOPY, FLEXIBLE, PROXIMAL TO SPLENIC FLEXURE; DIAGNOSTIC, W/WO COLLECTION SPECIMEN BY BRUSH OR WASH;  Surgeon: Liane Comber, MD;  Location: [facility];  Service: Gastroenterology    PR FEMORAL FX, OPEN TX Right 07/19/2022    Procedure: HEMI OPEN TREATMENT OF FEMORAL FRACTURE, PROXIMAL END, NECK, INTERNAL FIXATION OR PROSTHETIC REPLACEMENT - modifier 22;  Surgeon: Valda Favia, MD;  Location: MAIN OR [facility];  Service: Orthopedics    right hip surgery Right 07/19/2022    TONSILLECTOMY Bilateral 1973 approx     Social History:  Social History     Tobacco Use    Smoking status: Former     Current packs/day: 0.00     Average packs/day: 1 pack/day for 40.0 years (40.0 ttl pk-yrs)     Types: Cigarettes     Start date: 01/19/1982     Quit date: 01/19/2022     Years since quitting: 2.0     Passive exposure: Past    Smokeless tobacco: Never    Tobacco comments:     Pt stated stopped since 01/21/22   Vaping Use    Vaping status: Never Used   Substance Use Topics    Alcohol use: Not Currently     Comment: Occasionally; once a year    Drug use: No   she works at [employer] in [place]  Family History:  Family History   Problem Relation Age of Onset    Cancer Mother     Colon cancer Mother     Heart disease Father     Heart disease Sister     Diabetes Brother     Diabetes Brother     Diabetes Brother     Breast cancer Neg Hx     Melanoma Neg Hx     Basal cell carcinoma Neg Hx     Squamous cell carcinoma Neg Hx      Review of Systems: Please see above in the HPI, the remainder of a 10-system review was unremarkable.    Objective   Vitals:    02/04/24 0859   BP: 138/78   Pulse: 80   Temp: 36.8 ??C (98.2 ??F)       Physical Exam  General:   In no distress.    Eyes:   Conjunctivae are clear.   ENT:   MMM.    Skin:    No diffuse rash. There is a firm, mobile nodule under the L cheek.   Musculo Skeletal:   No synovitis in the MCPs, PIPs or wrists.  Full fist bilaterally.   FROM of both elbows. TTP over the left lateral epicondyle  Crepitus in both knees

## 2024-02-04 ENCOUNTER — Ambulatory Visit
Admit: 2024-02-04 | Discharge: 2024-02-05 | Payer: BLUE CROSS/BLUE SHIELD | Attending: Internal Medicine | Primary: Internal Medicine

## 2024-02-04 DIAGNOSIS — M0579 Rheumatoid arthritis with rheumatoid factor of multiple sites without organ or systems involvement: Principal | ICD-10-CM

## 2024-02-04 DIAGNOSIS — M7712 Lateral epicondylitis, left elbow: Principal | ICD-10-CM

## 2024-02-04 DIAGNOSIS — M25531 Pain in right wrist: Principal | ICD-10-CM

## 2024-02-04 LAB — CBC W/ AUTO DIFF
BASOPHILS ABSOLUTE COUNT: 0.1 10*9/L (ref 0.0–0.1)
BASOPHILS RELATIVE PERCENT: 0.8 %
EOSINOPHILS ABSOLUTE COUNT: 0.3 10*9/L (ref 0.0–0.5)
EOSINOPHILS RELATIVE PERCENT: 2.6 %
HEMATOCRIT: 36.8 % (ref 34.0–44.0)
HEMOGLOBIN: 12.3 g/dL (ref 11.3–14.9)
LYMPHOCYTES ABSOLUTE COUNT: 2.2 10*9/L (ref 1.1–3.6)
LYMPHOCYTES RELATIVE PERCENT: 20.7 %
MEAN CORPUSCULAR HEMOGLOBIN CONC: 33.3 g/dL (ref 32.0–36.0)
MEAN CORPUSCULAR HEMOGLOBIN: 31.7 pg (ref 25.9–32.4)
MEAN CORPUSCULAR VOLUME: 95 fL (ref 77.6–95.7)
MEAN PLATELET VOLUME: 8.4 fL (ref 6.8–10.7)
MONOCYTES ABSOLUTE COUNT: 0.9 10*9/L — ABNORMAL HIGH (ref 0.3–0.8)
MONOCYTES RELATIVE PERCENT: 8.8 %
NEUTROPHILS ABSOLUTE COUNT: 7.1 10*9/L (ref 1.8–7.8)
NEUTROPHILS RELATIVE PERCENT: 67.1 %
PLATELET COUNT: 257 10*9/L (ref 150–450)
RED BLOOD CELL COUNT: 3.88 10*12/L — ABNORMAL LOW (ref 3.95–5.13)
RED CELL DISTRIBUTION WIDTH: 16.8 % — ABNORMAL HIGH (ref 12.2–15.2)
WBC ADJUSTED: 10.6 10*9/L (ref 3.6–11.2)

## 2024-02-04 LAB — BASIC METABOLIC PANEL
ANION GAP: 11 mmol/L (ref 5–14)
BLOOD UREA NITROGEN: 23 mg/dL (ref 9–23)
BUN / CREAT RATIO: 32
CALCIUM: 9.6 mg/dL (ref 8.7–10.4)
CHLORIDE: 102 mmol/L (ref 98–107)
CO2: 28.8 mmol/L (ref 20.0–31.0)
CREATININE: 0.73 mg/dL (ref 0.55–1.02)
EGFR CKD-EPI (2021) FEMALE: >90 mL/min/{1.73_m2} (ref >=60)
GLUCOSE RANDOM: 101 mg/dL (ref 70–179)
POTASSIUM: 3.7 mmol/L (ref 3.4–4.8)
SODIUM: 142 mmol/L (ref 135–145)

## 2024-02-04 LAB — ALT: ALT (SGPT): 15 U/L (ref 10–49)

## 2024-02-04 LAB — AST: AST (SGOT): 25 U/L (ref <=34)

## 2024-02-04 MED ORDER — METHOTREXATE SODIUM 2.5 MG TABLET
ORAL_TABLET | ORAL | 3 refills | 84.00 days | Status: CP
Start: 2024-02-04 — End: ?

## 2024-02-04 NOTE — Unmapped (Signed)
 Left elbow lateral epicondylitis band  Referral to Occupational therapy for your right wrist.

## 2024-02-05 NOTE — Unmapped (Signed)
 [facility] Refill Coordination Note    Cynthia Ramirez, DOB: Dec 18, 1960  Phone: [phone] (home)       All above HIPAA information was verified with patient.         02/05/2024    10:18 AM   Specialty Rx Medication Refill Questionnaire   Which Medications would you like refilled and shipped? Humira pen 40mg . I have 1pen left.   Please list all current allergies: Suffer drugs only, nothing new.   Have you missed any doses in the last 30 days? No   Have you had any changes to your medication(s) since your last refill? No   How many days remaining of each medication do you have at home? 1 pen left that I will take next Monday, February 24th.   If receiving an injectable medication, next injection date is 02/10/2024   Have you experienced any side effects in the last 30 days? No   Please enter the full address (street address, city, state, zip code) where you would like your medication(s) to be delivered to. [address] [place],  [place].  [address]   Please specify on which day you would like your medication(s) to arrive. Note: if you need your medication(s) within 3 days, please call the pharmacy to schedule your order at [phone]  02/13/2024   Has your insurance changed since your last refill? No   Would you like a pharmacist to call you to discuss your medication(s)? No   Do you require a signature for your package? (Note: if we are billing Medicare Part B or your order contains a controlled substance, we will require a signature) No         Completed refill call assessment today to schedule patient's medication shipment from the [facility] [phone]).  All relevant notes have been reviewed.       Confirmed patient received a Conservation officer, historic buildings and a Surveyor, mining with first shipment. The patient will receive a drug information handout for each medication shipped and additional FDA Medication Guides as required.         REFERRAL TO PHARMACIST Referral to the pharmacist: Not needed      [facility]     Shipping address confirmed in Epic.     Delivery Scheduled: Yes, Expected medication delivery date: 2/27.     Medication will be delivered via Same Day Courier to the prescription address in Epic WAM.    [facility] Specialty Technician

## 2024-02-13 MED FILL — HUMIRA PEN CITRATE FREE 40 MG/0.4 ML: SUBCUTANEOUS | 28 days supply | Qty: 4 | Fill #1

## 2024-02-17 ENCOUNTER — Inpatient Hospital Stay: Admit: 2024-02-17 | Discharge: 2024-02-17 | Payer: BLUE CROSS/BLUE SHIELD

## 2024-02-17 NOTE — Unmapped (Signed)
 OUTPATIENT OCCUPATIONAL THERAPY    UPPER EXTREMITY EVALUATION    Patient Name: Cynthia Ramirez  Date of Birth:1961-01-10  Date: 02/18/2024  Visit #: 1  Insurance Type: BCBS  Plan of Care Certification Dates:     Encounter Diagnoses   Name Primary?    Rheumatoid arthritis involving multiple sites with positive rheumatoid factor (CMS-HCC)     Right wrist pain      Reason for referral:   Associated Diagnoses    Rheumatoid arthritis involving multiple sites with positive rheumatoid factor (CMS-HCC) [M05.79]  - Primary      Right wrist pain [M25.531]          Order Questions    Question Answer   Reason for referral: right wrist pain,RA   Suggest Treatment: Evaluation with suggestions for treatment    Splint Fabrication      Referring Provider: Malvin Johns  Onset of Symptoms: gradual onset.  Diagnosed 3 years ago with RA  Per Referring Provider's note: 02/04/24  RHEUMATOLOGY OUTPATIENT FOLLOWUP VISIT     Assessment/Plan:  Nil Bolser is a 63 y.o. 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 Taking methotrexate and Humira every 7 days.  Better control of RA with this combination.      Assessment/Plan:      Rheumatoid Arthritis  Chronic condition with elbow pain, knee stiffness, and thumb tendon issues. Improvement in nocturnal throbbing since starting weekly Humira weekly. 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 Thumb popping likely due to tendon issues. Vision problems unlikely to be related to Humira or methotrexate.  A lateral epicondylitis band can support the tendon and alleviate pain. Referral to a hand therapist for a custom brace due to difficulty finding a suitable off-the-shelf brace for her right wrist.  - Continue weekly Humira  - Continue methotrexate 20 mg po weekly  - Refer to occupational therapy for custom wrist brace  - Order blood work for methotrexate monitoring  - Refer to hand therapist for thumb tendon issue  - Recommend using a lateral epicondylitis band for elbow pain     Obesity  On Ozempic to aid in weight loss to reduce joint pain. Difficulty with injection process and no significant reduction in hunger yet. Dose will be increased gradually to improve efficacy. Emphasized importance of weight loss for reducing lower extremity joint pain.  - Continue Ozempic  - Monitor weight and adjust dosage as needed     Follow-up  - Schedule follow-up appointment in six months.         Interim History: Last seen in October.   Cynthia Ramirez is a 63 year old female with rheumatoid arthritis who presents with joint pain and stiffness. She is accompanied by her niece.     She experiences joint pain and stiffness, primarily in her left elbow and right knee. The stiffness in her knee feels like it 'won't move,' and she notes a sensation of her thumb 'popping out of place' when making a fist. No swelling or heat in her joints is reported.     She is currently on weekly Humira injections, which have alleviated her nighttime throbbing pain. She also takes methotrexate and has recently started Ozempic injections twice a week for weight management. She finds the Ozempic injections more challenging due to the visible needle.     Elbow pain has been present for a couple of months, particularly noticeable when using her walker.  She describes a grinding sensation in her shoulder, which she wonders might be related to her elbow pain. No trouble moving the elbow is reported, although pain is present.     Her thumb feels like it is 'popping out of place' when making a fist, suggesting possible tendon subluxation.     Her knee feels numb from the lower to the upper part, but it does not feel like it will give out. No heat is reported in the knee area.     She reports worsening vision, which she plans to discuss with her eye doctor, suspecting it may be related to her medications.          Communication preference: verbal, written, visual  Prognosis: good due to motivation    OT ASSESSMENT:   63 y.o. year old female with above diagnosis.  Patient requires skilled Occupational Therapy services for decreased range of motion, decreased strength, orthotic fit/management, impaired daily activities of living as appropriate.     Previous Level of Function: Pt was previously independent with all ADLs and IADLs prior to progression of RA, trigger thumb and tennis elbow.    CURRENT LEVEL OF FUNCTION  Social and Occupational: Patient is on disability and doesn't work.  She enjoys going out but reports difficulty due to having to use walker .  She also takes care of partner now because he is sick.  Leisure activities/Hobbies: none  Home function: Difficulty opening plastic containers, donning socks and pulling up pants, cutting food. Patient reports not having morning stiffness.        Low complexity: This patient demonstrates 1-3 performance deficits relating to physical, cognitive, and psychosocial skills resulting in activity limitations and/or participation restrictions.  This patient has no co-morbidities affecting occupational performance.  A problem focused assessment was performed.  Please refer to Current level of function section for further details.         PLAN  Short Term Goals:  1. In 1 session, patient will perform home exercise program with need for cuing to max IND with ADLs and IADLs. (met)  2. In 1 session, patient will demonstrate independent donning and doffing of orthotic to max joint integrity necessary for ADL completion. (met)  3. In 1 session, patient will verbalize proper care of orthotic and skin to max joint integrity necessary for ADL completion. (met)    Long Term Goals:   1. In 8 weeks, patient will perform upgraded home exercise program, to include progression to strengthening, independently  to max IND with ADLs and IADLs.  2. In 8 weeks, patient will demonstrate an average grip strength that is 65% of the contralateral side to max IND with I/ADLs, such as carrying/lifting heavy functional items.  3. In 8 weeks, patient will be independent with joint protection principles and use of adaptive equipment to be independent with I/ADLs.    OT  PLAN OF CARE:  Pt will participate in:  Self Care/Hometraining  Orthotic Fit/Management   Therapeutic Exercise  Therapeutic Activity   Neuromuscular Re-education  Ultrasound  Hot/Cold Pack  Electrical Stimulation  Iontophoresis  Orthotic/Prosthetic Measure and Fit   Joint Mobilization  Physical Performance Measure   Manual Therapy    Planned frequency and duration of treatment: 1x / week/ 8 weeks. Plan will be adjusted as necessary.     Patient in agreement with plan of care?: Yes        SUBJECTIVE:  Patient goals: to not drop things, stop triggering of thumb  Pain: 0/10 at rest.  Pain reported at right thumb and left epicondyle.    Sensation: Inconsistently having tingling in right fingers per patient report volar and dorsally.    Prior OT Service: yes    OBJECTIVE:  Precautions: per protocol    Upper Extremity Function:  Shoulder:  WNL    Elbow:  WFL but stiffness end range left flexion    Hand:   Pt is right hand dominant.               AROM (degrees) Date: 02/16/34  Right Date: 02/17/24  Left   Wrist     Extension/flexion 55/full 50/full   Radial/ulnar deviation     Forearm WNL WNL   Supination/pronation     Elbow WNL WNL but stiffness flexion end range   Ext/flex     Composite flex to [facility]   (cm lack)     Index Full but tight end range 0   Middle 0 0   Ring 0 Limited motion PIP flexion and extension end ranges   Small 0 0   Digit Extension full full   Thumb  Opposition  10/10 10/10     Digit ROM Index finger Long finger Ring finger Small finger   MCP ext/flex       PIP ext/flex       DIP ext/flex       TAM total         Grip and Pinch Strength Date:02/17/24  Right Date:02/17/24  Left   Gross Grip Strength (lbs)  Position 2 Female -  age 27-64 R: 37-77 L: 29-66    Trial 1 30 25    Trial 2 32 24   Trial 3 35 24        Average Pinch Strength (lbs)     Lateral Pinch     3-point Pinch     Tip Pinch       Wound/Incision(s) or Scar: n/a    Edema: none  No complaint of morning stiffness.    TREATMENT:    Music therapist and Management: (20 minutes)  Fabricated hand based thumb spica splint with MP in extension, with goal of positioning for decreased trigger . Pt educated on splint wearing protocol, including to wear at all times for 10 days except to take off for daily ROM exercises. Pt also educated on the need to perform frequent skin checks to ensure that there is no skin break down. Pt educated on proper care of splint, including cleaning procedures and advising to stay away from heat sources. Pt advised that if there are any remaining questions regarding the splint, they are welcome to contact us. Educated pt on and demonstrated proper donning and doffing of the splint.     Self Care/home training (20 minutes):  Therapist issued HEP with patient demonstration (see below) with handouts provided to the patient.   Educated pt on weightbearing precautions and healing time-line.  Initiated the following home training activities:  -PROM exercises for right PIP and left index finger  -AROM  -next visit: joint protection , energy conservation, paraffin, exercises, tennis elbow brace and massage, adaptive equipment    [facility]:   Apply low to moderate heat 10 min prior to exercises for improved tissue extensibility        I reviewed the no-show/attendance policy with the patient and caregiver(s). The family is aware that they must call to cancel appointments more than 24 hours in advance. They are also aware that  if they late cancel or no-show three times, we reserve the right to cancel their remaining appointments. This policy is in place to allow [place] to best serve the needs of our caseload.    TREATMENT RENDERED:  Orthotic Management/Training: 20 min  Self Care/Home Training: 20 min    Total Treatment Time: 50 mins  Total Evaluation Time: 10 mins    Patient Education:  Topics: home program, disease process  Education Provided to: patient  Education Type: education, demonstration, literature  Response to education/teachback: verbal understanding received, return demonstration    PAST MEDICAL HISTORY:  Reviewed   Past Medical History:   Diagnosis Date    Abnormal mammogram     At risk for falls     Cataract last eye appointment showed lite catact    Cognitive impairment     has a hard time remembering    COPD (chronic obstructive pulmonary disease) (CMS-HCC)     COPD (chronic obstructive pulmonary disease) (CMS-HCC) 03/06/2018    Diabetes mellitus (CMS-HCC)     Dizziness     Emphysema of lung (CMS-HCC) see [place] chart for info    see [place] chart for info    Essential hypertension, benign     Family history of diabetes mellitus (DM)     Financial difficulties     Hearing impairment     has a hole in her eardrum    HL (hearing loss)     Hyperlipidemia     in my chart    Impaired mobility     Lack of access to transportation     Nonadherence to medication     Obesity yes over weight    Problem with transportation     Rheumatoid arthritis (CMS-HCC) 2017    Marked elevation of rheumatoid factor 130.5  and CCP>250    Tobacco use disorder     Visual impairment     hard to see per patient       Past Surgical History: Reviewed  Past Surgical History:   Procedure Laterality Date    BREAST BIOPSY Left 02/2016    HYSTERECTOMY      TAH before age 42    INNER EAR SURGERY Bilateral     2004 and earlier -- unspecified nature of surgery    PILONIDAL CYST DRAINAGE      PR COLONOSCOPY FLX DX W/COLLJ SPEC WHEN PFRMD N/A 03/13/2016    Procedure: COLONOSCOPY, FLEXIBLE, PROXIMAL TO SPLENIC FLEXURE; DIAGNOSTIC, W/WO COLLECTION SPECIMEN BY BRUSH OR WASH;  Surgeon: Liane Comber, MD;  Location: [facility] [place];  Service: Gastroenterology    PR FEMORAL FX, OPEN TX Right 07/19/2022    Procedure: HEMI OPEN TREATMENT OF FEMORAL FRACTURE, PROXIMAL END, NECK, INTERNAL FIXATION OR PROSTHETIC REPLACEMENT - modifier 22;  Surgeon: Valda Favia, MD;  Location: [facility];  Service: Orthopedics    right hip surgery Right 07/19/2022    TONSILLECTOMY Bilateral 1973 approx       Allergies: Reviewed  Doxycycline hcl, Shellfish containing products, and Sulfa (sulfonamide antibiotics)    Medications: Reviewed    Current Outpatient Medications:     acetaminophen (TYLENOL) 325 MG tablet, Take 2 tablets (650 mg total) by mouth two (2) times a day as needed., Disp: , Rfl:     atorvastatin (LIPITOR) 40 MG tablet, Take 1 tablet (40 mg total) by mouth daily., Disp: 90 tablet, Rfl: 3    baclofen (LIORESAL) 10 MG tablet, Take 1 tablet (10 mg total) by mouth  Three (3) times a day., Disp: 30 tablet, Rfl: 1    cholecalciferol, vitamin D3-50 mcg, 2,000 unit,, 50 mcg (2,000 unit) tablet, Take 1 tablet (50 mcg total) by mouth daily. 2 tablets daily, Disp: , Rfl:     fluticasone propion-salmeterol (ADVAIR HFA) 230-21 mcg/actuation inhaler, Inhale 2 puffs  in the morning and 2 puffs in the evening., Disp: 36 g, Rfl: 3    folic acid (FOLVITE) 1 MG tablet, TAKE 1 TABLET BY MOUTH EVERY DAY, Disp: 90 tablet, Rfl: 3    furosemide (LASIX) 40 MG tablet, Take 1 tablet (40 mg total) by mouth daily. Take one tablet daily as needed for weight gain (goal weight ~248-251lbs) (Patient taking differently: Take 0.5 tablets (20 mg total) by mouth daily. Take one tablet daily as needed for edema. Do not administer if SBP<100.), Disp: , Rfl: 0    HUMIRA PEN CITRATE FREE 40 MG/0.4 ML, Inject 0.4 mL (40 mg total) under the skin every seven (7) days., Disp: 4 each, Rfl: 11    inhalational spacing device (AEROCHAMBER MV) Spcr, 1 each by Miscellaneous route daily., Disp: 1 each, Rfl: 0    losartan (COZAAR) 25 MG tablet, Take 1 tablet (25 mg total) by mouth daily., Disp: 90 tablet, Rfl: 3    magnesium oxide (MAG-OX) 400 mg (241.3 mg elemental magnesium) tablet, Take 1 tablet (400 mg total) by mouth daily., Disp: 90 tablet, Rfl: 3    methotrexate 2.5 MG tablet, Take 8 tablets (20 mg total) by mouth once a week., Disp: 96 tablet, Rfl: 3    multivitamin (TAB-A-VITE/THERAGRAN) per tablet, Take 1 tablet by mouth daily., Disp: , Rfl:     omeprazole (PRILOSEC) 20 MG capsule, Take 1 capsule (20 mg total) by mouth daily., Disp: 90 capsule, Rfl: 3    semaglutide (OZEMPIC) 0.25 mg or 0.5 mg (2 mg/3 mL) PnIj, Inject 0.25 mg under the skin every seven (7) days for 28 days, THEN 0.5 mg every seven (7) days for 28 days., Disp: 6 mL, Rfl: 1    [START ON 03/12/2024] semaglutide (OZEMPIC) 1 mg/dose (4 mg/3 mL) PnIj injection, Inject 1 mg under the skin every seven (7) days for 4 doses., Disp: 3 mL, Rfl: 0    tiotropium bromide (SPIRIVA RESPIMAT) 2.5 mcg/actuation inhalation mist, Inhale 2 puffs daily., Disp: 16 g, Rfl: 11    I attest that I have reviewed the above information.  Signed: Caleen Essex, OT  02/18/2024 12:53 PM

## 2024-02-18 ENCOUNTER — Ambulatory Visit: Admit: 2024-02-18 | Payer: BLUE CROSS/BLUE SHIELD

## 2024-02-18 DIAGNOSIS — F17200 Nicotine dependence, unspecified, uncomplicated: Principal | ICD-10-CM

## 2024-02-22 MED ORDER — ATORVASTATIN 40 MG TABLET
ORAL_TABLET | Freq: Every day | ORAL | 3 refills | 0.00 days
Start: 2024-02-22 — End: ?

## 2024-02-24 MED ORDER — ATORVASTATIN 40 MG TABLET
ORAL_TABLET | Freq: Every day | ORAL | 2 refills | 90 days | Status: CP
Start: 2024-02-24 — End: ?

## 2024-02-24 NOTE — Unmapped (Signed)
 [facility]  Pulmonary Clinic - Follow-up Visit    Referring Physician :  Audie Box  PCP:     Colford, Jackquline Berlin, MD  Reason for Consult:   Followup COPD      ASSESSMENT and PLAN     Cynthia Ramirez is a 63 y.o. female with obesity, COPD on 1-2L, former tobacco use (1ppd, >30 years, quit in Feb 2023), HFpEF, RA on methotrexate, OSA on BiPAP, knee osteoarthritis whom we are seeing in for evaluation of hypoxic respiratory failure, now improving and she has been off O2.     Doing well on current regimen: Spiriva and Advair. Rarely uses her rescue inhaler. Has been able to wean off of O2, sats >89% even with exertion at home. Dyspnea also improving. She is starting aquatic therapy soon. She uses Lasix PRN for weight gain, Last TTE showed diastolic dysfunction, normal RV.     Plan today:   - followup in 6 months for COPD monitoring  - decreasing Advair dose today   - repeat LDCT in March 2026  - referral to pulmonary rehab  - cont good CPAP compliance, CPAP study she needed 2L, will repeat overnight pulse oximetry  - f/up pule oximetry, likely will need new O2 rx  - immunosuppression per rheum, no e/o ILD on Chest CT. Will CTM her PFTs    [name] was discussed with Dr. Garner Nash who agrees with the assessment and plan above.     GN:FAOZH C Krishika Bugge, Colford, Jackquline Berlin, MD    Alesia Banda, MD  Pulmonary & Critical Care Fellow    HISTORY:      Active Pulmonary Problems & Brief History:  Cynthia Ramirez is a 63 y.o. female with a history of obesity, COPD, tobacco use (1ppd, >30 years, quit in Feb 2023), OSA, HFpEF, RA on methotrexate/humira, knee osteoarthritis whom we are following for COPD and OSA.     Brief history by problem:  #COPD, FEV1 75% predicted (GOLD2B)   - MMRC 2  - last PFT 04/2023: FEV 75%, DLCO moderately reduced 57%  - last exacerbation - hospitalized 01/20/22-02/07/22 hypoxic respiratory failure 2/2 viral pneumonia and RV failure  - current regiment -  advair BID + spiriva + albuterol PRN - has never used albuterol inhaler  - labs  - A1AT - negative  - eos - 200 (highest 300 historically)  - IgE - 105  - ANCA - defer for now, low suspicion  - imaging: Feb 2024 stable on repeat Feb 2025  - smoking: 30 + pack years, stopped since discharge in Feb 2023  - lung cancer screening - UTD, next due March 2026  - vaccination (pneumococcal, flu, COVID): UTD flu, COVID, RSV     #HFpEF  - Last TTE 2024: LVEF 55%, Grade I DD.   - on Lasix PRN     #GERD symptoms  - no issues on PPI (prilosec)     #OSA symptoms:   - currently on CPAP 13 ([employer])  - no longer getting 2L bleed in because they took away all of her oxygen  - last sleep study rec:  Severe obstructive sleep apnea with an overall AHI = 76.  The respiratory events were associated with oxygen desaturation to a low of 74% and severe fragmentation of sleep architecture.  In the PAP titration portion of the study, CPAP was titrated up to 13 cm H2O in the supine position in REM sleep with improvement in obstructive  events.   Hypoxemia. Patient required 2 lpm supplemental oxygen to keep saturations in the low-mid 90's.    #Sinus symptoms  - well controlled on saline rinse     Interval events 02/26/24:  - off O2  - taking Advair and Spiriva  - never needed albuterol   - no recent steroids or exacerbation   - takes lasix (when gains 2lbs in 24 hours) - has not had to use recently   - starting aquatic therapy     Immunization History   Administered Date(s) Administered Comments    COVID-19 VAC,BIVALENT(72YR UP),PFIZER 10/10/2021      04/11/2022     COVID-19 VACC,MRNA,(PFIZER)(PF) 03/11/2020 Per [facility]     04/01/2020 Per [facility]     09/22/2020     Covid-19 Vac, (33yr+) (Comirnaty) Mrna Pfizer  09/19/2023     Covid-19 Vac, (2yr+) (Spikevax) Monovalent Moderna 10/23/2022     INFLUENZA VACCINE IIV3(IM)(PF)6 MOS UP 09/19/2023     Influenza Vaccine Quad(IM)6 MO-Adult(PF) 09/11/2018      09/05/2019      09/29/2020      10/10/2021 10/03/2022     Influenza Virus Vaccine, unspecified formulation 03/11/2017      09/16/2019 [facility]    PNEUMOCOCCAL POLYSACCHARIDE 23-VALENT 09/29/2020     Pneumococcal Conjugate 13-Valent 05/17/2016     RSV VACCINE, BIVALENT(PF)(ABRYSVO) 11/14/2022     TdaP 09/20/2008 [facility]     01/16/2024 Adminis        OSH records and referral documentation reviewed.     Review of Systems:  A comprehensive review of systems was completed and negative except as noted in HPI.    PMHx:  - COPD  - RA  - HFpEF    PSHx:  Procedure Laterality Date    BREAST BIOPSY Left 02/2016    HYSTERECTOMY         TAH before age 55    INNER EAR SURGERY Bilateral       2004 and earlier -- unspecified nature of surgery    PILONIDAL CYST DRAINAGE        PR COLONOSCOPY FLX DX W/COLLJ SPEC WHEN PFRMD N/A 03/13/2016     Procedure: COLONOSCOPY, FLEXIBLE, PROXIMAL TO SPLENIC FLEXURE; DIAGNOSTIC, W/WO COLLECTION SPECIMEN BY BRUSH OR WASH;  Surgeon: Liane Comber, MD;  Location: [facility];  Service: Gastroenterology    PR FEMORAL FX, OPEN TX Right 07/19/2022     Procedure: HEMI OPEN TREATMENT OF FEMORAL FRACTURE, PROXIMAL END, NECK, INTERNAL FIXATION OR PROSTHETIC REPLACEMENT - modifier 22;  Surgeon: Valda Favia, MD;  Location: MAIN OR [facility];  Service: Orthopedics    right hip surgery Right 07/19/2022    TONSILLECTOMY         FHx:  Reviewed    Social   Smoking (cigarettes, vaping, marijuana): Quit Feb 2023, 30 pack year smoking hx    Relevant Medications:   -advair  -spiriva  -albuterol  -MTX    Allergies: Reviewed.    Other History:  The past medical history, surgical history, social history, family history, medications and allergies were personally reviewed and updated in the patient's electronic medical record. Pertinent items are noted above.       PHYSICAL EXAM:      Vitals:    02/26/24 1117   BP: 132/82   Pulse: 64   Temp: 36.7 ??C (98.1 ??F)   SpO2: 92%       General: Alert, NAD, appears well nourished  Eyes: sclera clear, anicteric, conjunctiva without erythema/injection  HEENT: nares patent  Heart: S1, S2 normal, RRR  Lungs: normal WOB on RA, CTAB, no focal crackles or wheezes, good air movement  Skin: WWP, No rashes, wounds, lesions on clothed exam  Neuro: Alert, oriented, follows commands. Moving all extremities spontaneously  Psych: No agitation, anxiety, or depression    LABORATORY and RADIOLOGY DATA:     Pulmonary Function Tests:  04/2023:  The expiratory flow volume curve shows mild obstructive ventilatory defect.  DLCO is moderately reduced. Inspiratory limb of flow-volume loop demonstrates normal forces inspiratory flows.  Improved since prior test on 08/07/2022         Pulmonary Function Testing:        Pertinent Imaging Data:  Images personally reviewed and discussed with attending     TTE 04/2023:  Summary    1. The left ventricle is mildly dilated in size with normal wall thickness.    2. The left ventricular systolic function is normal, LVEF is visually  estimated at > 55%.    3. There is grade I diastolic dysfunction (impaired relaxation).    4. Mitral annular calcification is present.    5. The aortic valve is trileaflet with mildly thickened leaflets with normal  excursion.    6. The left atrium is mildly dilated in size.    7. The right ventricle is normal in size, with normal systolic function.    8. There is no evidence of an interatrial flow communication or  intrapulmonary shunt by agitated saline study.    9. IVC size and inspiratory change suggest mildly elevated right atrial  pressure. (5-10 mmHg).    CT (LDCT) 01/2023:  IMPRESSION:  --Middle lobe micronodule.  --Enlarged main pulmonary artery measures up to 3.7 cm, nonspecific, which may be seen in setting of pulmonary hypertension.  --1.8 cm intermediate attenuation right upper pole renal lesion is indeterminate, high attenuation cyst versus renal cortical neoplasm.  --Stable bilateral axillary lymph nodes measuring up to 1.4 cm.  --Mild three-vessel coronary calcifications.  -- Asymmetrically prominent left breast soft tissue with punctate calcification.

## 2024-02-26 ENCOUNTER — Ambulatory Visit
Admit: 2024-02-26 | Discharge: 2024-02-27 | Payer: BLUE CROSS/BLUE SHIELD | Attending: Student in an Organized Health Care Education/Training Program | Primary: Student in an Organized Health Care Education/Training Program

## 2024-02-26 ENCOUNTER — Inpatient Hospital Stay: Admit: 2024-02-26 | Discharge: 2024-02-27 | Payer: BLUE CROSS/BLUE SHIELD

## 2024-02-26 DIAGNOSIS — J449 Chronic obstructive pulmonary disease, unspecified: Principal | ICD-10-CM

## 2024-02-26 DIAGNOSIS — F17200 Nicotine dependence, unspecified, uncomplicated: Principal | ICD-10-CM

## 2024-02-26 MED ORDER — FLUTICASONE PROPIONATE 115 MCG-SALMETEROL 21 MCG/ACTUATION HFA INHALER
Freq: Two times a day (BID) | RESPIRATORY_TRACT | 11 refills | 30.00 days | Status: CP
Start: 2024-02-26 — End: 2025-02-25

## 2024-02-26 NOTE — Unmapped (Signed)
 It was good to see you in clinic today!    For your COPD:  DECREASE your Advair to 1 puff twice daily   If you start to feel worse/feel worsening shortness of breath let me know and we can return to your normal dose

## 2024-03-03 NOTE — Unmapped (Signed)
 OUTPATIENT OCCUPATIONAL THERAPY    UPPER EXTREMITY TREATMENT    Patient Name: Cynthia Ramirez  Date of Birth:16-Aug-1961  Date: 03/03/2024  Visit #: 2  Insurance Type: BCBS  Plan of Care Certification Dates:     Encounter Diagnoses   Name Primary?    Rheumatoid arthritis involving multiple sites with positive rheumatoid factor (CMS-HCC) Yes    Right wrist pain        Reason for referral:   Associated Diagnoses    Rheumatoid arthritis involving multiple sites with positive rheumatoid factor (CMS-HCC) [M05.79]  - Primary      Right wrist pain [M25.531]          Order Questions    Question Answer   Reason for referral: right wrist pain,RA   Suggest Treatment: Evaluation with suggestions for treatment    Splint Fabrication      Referring Provider: Malvin Johns  Onset of Symptoms: gradual onset.  Diagnosed 3 years ago with RA  Per Referring Provider's note: 02/04/24  RHEUMATOLOGY OUTPATIENT FOLLOWUP VISIT     Assessment/Plan:  Cynthia Ramirez is a 63 y.o. 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 Taking methotrexate and Humira every 7 days.  Better control of RA with this combination.      Assessment/Plan:      Rheumatoid Arthritis  Chronic condition with elbow pain, knee stiffness, and thumb tendon issues. Improvement in nocturnal throbbing since starting weekly Humira weekly. 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 Thumb popping likely due to tendon issues. Vision problems unlikely to be related to Humira or methotrexate.  A lateral epicondylitis band can support the tendon and alleviate pain. Referral to a hand therapist for a custom brace due to difficulty finding a suitable off-the-shelf brace for her right wrist.  - Continue weekly Humira  - Continue methotrexate 20 mg po weekly  - Refer to occupational therapy for custom wrist brace  - Order blood work for methotrexate monitoring  - Refer to hand therapist for thumb tendon issue  - Recommend using a lateral epicondylitis band for elbow pain     Obesity  On Ozempic to aid in weight loss to reduce joint pain. Difficulty with injection process and no significant reduction in hunger yet. Dose will be increased gradually to improve efficacy. Emphasized importance of weight loss for reducing lower extremity joint pain.  - Continue Ozempic  - Monitor weight and adjust dosage as needed     Follow-up  - Schedule follow-up appointment in six months.         Interim History: Last seen in October.   Cynthia Ramirez is a 63 year old female with rheumatoid arthritis who presents with joint pain and stiffness. She is accompanied by her niece.     She experiences joint pain and stiffness, primarily in her left elbow and right knee. The stiffness in her knee feels like it 'won't move,' and she notes a sensation of her thumb 'popping out of place' when making a fist. No swelling or heat in her joints is reported.     She is currently on weekly Humira injections, which have alleviated her nighttime throbbing pain. She also takes methotrexate and has recently started Ozempic injections twice a week for weight management. She finds the Ozempic injections more challenging due to the visible needle.     Elbow pain has been present for a couple of months, particularly noticeable when using  her walker. She describes a grinding sensation in her shoulder, which she wonders might be related to her elbow pain. No trouble moving the elbow is reported, although pain is present.     Her thumb feels like it is 'popping out of place' when making a fist, suggesting possible tendon subluxation.     Her knee feels numb from the lower to the upper part, but it does not feel like it will give out. No heat is reported in the knee area.     She reports worsening vision, which she plans to discuss with her eye doctor, suspecting it may be related to her medications.          Communication preference: verbal, written, visual  Prognosis: good due to motivation    OT ASSESSMENT:   63 y.o. year old female with above diagnosis.  Patient requires skilled Occupational Therapy services for decreased range of motion, decreased strength, orthotic fit/management, impaired daily activities of living as appropriate.     Previous Level of Function: Pt was previously independent with all ADLs and IADLs prior to progression of RA, trigger thumb and tennis elbow.    CURRENT LEVEL OF FUNCTION  Social and Occupational: Patient is on disability and doesn't work.  She enjoys going out but reports difficulty due to having to use walker .  She also takes care of partner now because he is sick.  Leisure activities/Hobbies: none  Home function: Difficulty opening plastic containers, donning socks and pulling up pants, cutting food. Patient reports not having morning stiffness.        Low complexity: This patient demonstrates 1-3 performance deficits relating to physical, cognitive, and psychosocial skills resulting in activity limitations and/or participation restrictions.  This patient has no co-morbidities affecting occupational performance.  A problem focused assessment was performed.  Please refer to Current level of function section for further details.         PLAN  Short Term Goals:  1. In 1 session, patient will perform home exercise program with need for cuing to max IND with ADLs and IADLs. (met)  2. In 1 session, patient will demonstrate independent donning and doffing of orthotic to max joint integrity necessary for ADL completion. (met)  3. In 1 session, patient will verbalize proper care of orthotic and skin to max joint integrity necessary for ADL completion. (met)    Long Term Goals:   1. In 8 weeks, patient will perform upgraded home exercise program, to include progression to strengthening, independently  to max IND with ADLs and IADLs.  2. In 8 weeks, patient will demonstrate an average grip strength that is 65% of the contralateral side to max IND with I/ADLs, such as carrying/lifting heavy functional items.  3. In 8 weeks, patient will be independent with joint protection principles and use of adaptive equipment to be independent with I/ADLs.    OT  PLAN OF CARE:  Pt will participate in:  Self Care/Hometraining  Orthotic Fit/Management   Therapeutic Exercise  Therapeutic Activity   Neuromuscular Re-education  Ultrasound  Hot/Cold Pack  Electrical Stimulation  Iontophoresis  Orthotic/Prosthetic Measure and Fit   Joint Mobilization  Physical Performance Measure   Manual Therapy    Planned frequency and duration of treatment: 1x / week/ 8 weeks. Plan will be adjusted as necessary.     Patient in agreement with plan of care?: Yes        SUBJECTIVE:  Patient goals: to not drop things, stop triggering of thumb  Pain: 0/10 at rest.  Pain reported at right thumb and left epicondyle.    Sensation: Inconsistently having tingling in right fingers per patient report volar and dorsally.    Prior OT Service: yes    OBJECTIVE:  Precautions: per protocol    Upper Extremity Function:  Shoulder:  WNL    Elbow:  WFL but stiffness end range left flexion    Hand:   Pt is right hand dominant.               AROM (degrees) Date: 02/16/34  Right Date: 02/17/24  Left   Wrist     Extension/flexion 55/full 50/full   Radial/ulnar deviation     Forearm WNL WNL   Supination/pronation     Elbow WNL WNL but stiffness flexion end range   Ext/flex     Composite flex to [facility]   (cm lack)     Index Full but tight end range 0   Middle 0 0   Ring 0 Limited motion PIP flexion and extension end ranges   Small 0 0   Digit Extension full full   Thumb  Opposition  10/10 10/10     Digit ROM Index finger Long finger Ring finger Small finger   MCP ext/flex       PIP ext/flex       DIP ext/flex       TAM total         Grip and Pinch Strength Date:02/17/24  Right Date:02/17/24  Left   Gross Grip Strength (lbs)  Position 2 Female -  age 72-64 R: 37-77 L: 29-66    Trial 1 30 25    Trial 2 32 24   Trial 3 35 24 Average     Pinch Strength (lbs)     Lateral Pinch     3-point Pinch     Tip Pinch       Wound/Incision(s) or Scar: n/a    Edema: none  No complaint of morning stiffness.    TREATMENT:  Patient reports: triggering has decreased significantly.    Orthotics Fit and Management: (15 minutes)  Fabricated new dorsal, hand based thumb spica splint with MP in extension, with goal of positioning for decreased trigger of thumb.  First splint was to disruptive for her due to it being volar based. Pt educated on splint wearing protocol, including to wear at night time for 2 weeks. Pt also educated on the need to perform frequent skin checks to ensure that there is no skin break down. Pt educated on proper care of splint, including cleaning procedures and advising to stay away from heat sources. Pt advised that if there are any remaining questions regarding the splint, they are welcome to contact us. Educated pt on and demonstrated proper donning and doffing of the splint.     Self Care/home training (30 minutes):  Therapist issued HEP with patient demonstration (see below) with handouts provided to the patient.   Educated pt on ergonomics, RA, trigger thumb, carpal tunnel and lateral epicondylitis and healing time-lines.  Continued with the following home training activities:  -PROM exercises for right PIP and left index finger  -AROM  -massage to thumb MP volarly and lateral epicondyle.    -next visit: joint protection, energy conservation, paraffin, stretching exercises for tennis elbow, tennis elbow brace, adaptive equipment    [employer] Home Program:   Apply low to moderate heat 10 min prior to exercises for improved tissue extensibility    Access Code: [email or web address]  URL: [email or web address]  Date: 03/03/2024  Prepared by: Elpidio Galea    Patient Education  - Carpal Tunnel Syndrome  - What Is Carpal Tunnel Syndrome?  - Tennis Elbow  - What Are Trigger Finger and Trigger Thumb?    I reviewed the no-show/attendance policy with the patient and caregiver(s). The family is aware that they must call to cancel appointments more than 24 hours in advance. They are also aware that if they late cancel or no-show three times, we reserve the right to cancel their remaining appointments. This policy is in place to allow [place] to best serve the needs of our caseload.    TREATMENT RENDERED:  Orthotic Management/Training: 15 min  Self Care/Home Training: 30 min    Total Treatment Time: 45 mins      Patient Education:  Topics: home program, disease process  Education Provided to: patient  Education Type: education, demonstration, literature  Response to education/teachback: verbal understanding received, return demonstration    PAST MEDICAL HISTORY:  Reviewed   Past Medical History:   Diagnosis Date    Abnormal mammogram     At risk for falls     Cataract last eye appointment showed lite catact    Cognitive impairment     has a hard time remembering    COPD (chronic obstructive pulmonary disease) (CMS-HCC)     COPD (chronic obstructive pulmonary disease) (CMS-HCC) 03/06/2018    Diabetes mellitus (CMS-HCC)     Dizziness     Emphysema of lung (CMS-HCC) see [place] chart for info    see [place] chart for info    Essential hypertension, benign     Family history of diabetes mellitus (DM)     Financial difficulties     Hearing impairment     has a hole in her eardrum    HL (hearing loss)     Hyperlipidemia     in my chart    Impaired mobility     Lack of access to transportation     Nonadherence to medication     Obesity yes over weight    Problem with transportation     Rheumatoid arthritis (CMS-HCC) 2017    Marked elevation of rheumatoid factor 130.5  and CCP>250    Tobacco use disorder     Visual impairment     hard to see per patient       Past Surgical History: Reviewed  Past Surgical History:   Procedure Laterality Date    BREAST BIOPSY Left 02/2016    HYSTERECTOMY      TAH before age 77    INNER EAR SURGERY Bilateral     2004 and earlier -- unspecified nature of surgery    PILONIDAL CYST DRAINAGE      PR COLONOSCOPY FLX DX W/COLLJ SPEC WHEN PFRMD N/A 03/13/2016    Procedure: COLONOSCOPY, FLEXIBLE, PROXIMAL TO SPLENIC FLEXURE; DIAGNOSTIC, W/WO COLLECTION SPECIMEN BY BRUSH OR WASH;  Surgeon: Liane Comber, MD;  Location: [facility];  Service: Gastroenterology    PR FEMORAL FX, OPEN TX Right 07/19/2022    Procedure: HEMI OPEN TREATMENT OF FEMORAL FRACTURE, PROXIMAL END, NECK, INTERNAL FIXATION OR PROSTHETIC REPLACEMENT - modifier 22;  Surgeon: Valda Favia, MD;  Location: MAIN OR [place];  Service: Orthopedics    right hip surgery Right 07/19/2022    TONSILLECTOMY Bilateral 1973 approx       Allergies: Reviewed  Doxycycline hcl, Shellfish containing products, and Sulfa (sulfonamide antibiotics)    Medications: Reviewed  Current Outpatient Medications:     acetaminophen (TYLENOL) 325 MG tablet, Take 2 tablets (650 mg total) by mouth two (2) times a day as needed., Disp: , Rfl:     albuterol HFA 90 mcg/actuation inhaler, Inhale 2 puffs every six (6) hours as needed for wheezing., Disp: , Rfl:     atorvastatin (LIPITOR) 40 MG tablet, TAKE 1 TABLET BY MOUTH EVERY DAY, Disp: 90 tablet, Rfl: 2    azithromycin (ZITHROMAX) 250 MG tablet, TAKE 2 TABLETS BY MOUTH TODAY, THEN TAKE 1 TABLET DAILY FOR 4 DAYS AS DIRECTED, Disp: , Rfl:     baclofen (LIORESAL) 10 MG tablet, Take 1 tablet (10 mg total) by mouth Three (3) times a day., Disp: 30 tablet, Rfl: 1    cholecalciferol, vitamin D3-50 mcg, 2,000 unit,, 50 mcg (2,000 unit) tablet, Take 1 tablet (50 mcg total) by mouth daily. 2 tablets daily, Disp: , Rfl:     ciprofloxacin HCl (CIPRO) 500 MG tablet, Take 1 tablet (500 mg total) by mouth every twelve (12) hours., Disp: , Rfl:     fluticasone propion-salmeterol (ADVAIR HFA) 115-21 mcg/actuation inhaler, Inhale 2 puffs two (2) times a day., Disp: 12 g, Rfl: 11    folic acid (FOLVITE) 1 MG tablet, TAKE 1 TABLET BY MOUTH EVERY DAY, Disp: 90 tablet, Rfl: 3    furosemide (LASIX) 40 MG tablet, Take 1 tablet (40 mg total) by mouth daily. Take one tablet daily as needed for weight gain (goal weight ~248-251lbs) (Patient taking differently: Take 0.5 tablets (20 mg total) by mouth daily. Take one tablet daily as needed for edema. Do not administer if SBP<100.), Disp: , Rfl: 0    HUMIRA PEN CITRATE FREE 40 MG/0.4 ML, Inject 0.4 mL (40 mg total) under the skin every seven (7) days., Disp: 4 each, Rfl: 11    inhalational spacing device (AEROCHAMBER MV) Spcr, 1 each by Miscellaneous route daily., Disp: 1 each, Rfl: 0    losartan (COZAAR) 25 MG tablet, Take 1 tablet (25 mg total) by mouth daily., Disp: 90 tablet, Rfl: 3    losartan-hydroCHLOROthiazide (HYZAAR) 50-12.5 mg per tablet, Take 1 tablet by mouth daily., Disp: , Rfl:     magnesium oxide (MAG-OX) 400 mg (241.3 mg elemental magnesium) tablet, Take 1 tablet (400 mg total) by mouth daily., Disp: 90 tablet, Rfl: 3    methotrexate 2.5 MG tablet, Take 8 tablets (20 mg total) by mouth once a week., Disp: 96 tablet, Rfl: 3    multivitamin (TAB-A-VITE/THERAGRAN) per tablet, Take 1 tablet by mouth daily., Disp: , Rfl:     omeprazole (PRILOSEC) 20 MG capsule, Take 1 capsule (20 mg total) by mouth daily., Disp: 90 capsule, Rfl: 3    semaglutide (OZEMPIC) 0.25 mg or 0.5 mg (2 mg/3 mL) PnIj, Inject 0.25 mg under the skin every seven (7) days for 28 days, THEN 0.5 mg every seven (7) days for 28 days., Disp: 6 mL, Rfl: 1    [START ON 03/12/2024] semaglutide (OZEMPIC) 1 mg/dose (4 mg/3 mL) PnIj injection, Inject 1 mg under the skin every seven (7) days for 4 doses. (Patient not taking: Reported on 02/26/2024), Disp: 3 mL, Rfl: 0    tiotropium bromide (SPIRIVA RESPIMAT) 2.5 mcg/actuation inhalation mist, Inhale 2 puffs daily., Disp: 16 g, Rfl: 11    I attest that I have reviewed the above information.  Signed: Caleen Essex, OT  03/03/2024 4:25 PM

## 2024-03-09 MED ORDER — MAGNESIUM OXIDE 400 MG (241.3 MG MAGNESIUM) TABLET
ORAL_TABLET | Freq: Every day | ORAL | 2 refills | 90 days | Status: CP
Start: 2024-03-09 — End: 2025-01-03

## 2024-03-09 NOTE — Unmapped (Signed)
 [facility] Refill Coordination Note    Specialty Medication(s) to be Shipped:   Inflammatory Disorders: Humira    Other medication(s) to be shipped: No additional medications requested for fill at this time     Cynthia Ramirez, DOB: 03/02/1961  Phone: [phone] (home)       All above HIPAA information was verified with patient.     Was a Nurse, learning disability used for this call? No    Completed refill call assessment today to schedule patient's medication shipment from the [facility]  [phone]).  All relevant notes have been reviewed.     Specialty medication(s) and dose(s) confirmed: Regimen is correct and unchanged.   Changes to medications: Lurine reports starting the following medications: Ozempic  Changes to insurance: No  New side effects reported not previously addressed with a pharmacist or physician: None reported  Questions for the pharmacist: No    Confirmed patient received a Conservation officer, historic buildings and a Surveyor, mining with first shipment. The patient will receive a drug information handout for each medication shipped and additional FDA Medication Guides as required.       DISEASE/MEDICATION-SPECIFIC INFORMATION        For patients on injectable medications: Patient currently has 1 doses left.  Next injection is scheduled for 03/09/2024.    SPECIALTY MEDICATION ADHERENCE     Medication Adherence    Patient reported X missed doses in the last month: 0  Specialty Medication: adalimumab: HUMIRA(CF) PEN 40 mg/0.4 mL injection  Patient is on additional specialty medications: No              Were doses missed due to medication being on hold? No    Humira 40/0.4 mg/ml: 1 doses of medicine on hand       REFERRAL TO PHARMACIST     Referral to the pharmacist: Not needed      [facility]     Shipping address confirmed in Epic.     Cost and Payment: Patient has a $0 copay, payment information is not required.    Delivery Scheduled: Yes, Expected medication delivery date: 03/13/2024.     Medication will be delivered via Same Day Courier to the prescription address in Epic WAM.    [name]   [facility]  Specialty Technician

## 2024-03-10 MED ORDER — ATORVASTATIN 40 MG TABLET
ORAL_TABLET | Freq: Every day | ORAL | 3 refills | 90 days | Status: CP
Start: 2024-03-10 — End: ?

## 2024-03-10 MED ORDER — LOSARTAN 50 MG-HYDROCHLOROTHIAZIDE 12.5 MG TABLET
ORAL_TABLET | Freq: Every day | ORAL | 3 refills | 90 days | Status: CP
Start: 2024-03-10 — End: ?

## 2024-03-10 MED ORDER — OMEPRAZOLE 20 MG CAPSULE,DELAYED RELEASE
ORAL_CAPSULE | Freq: Every day | ORAL | 3 refills | 90 days | Status: CP
Start: 2024-03-10 — End: 2025-03-10

## 2024-03-11 MED ORDER — PENICILLIN V POTASSIUM 500 MG TABLET
ORAL_TABLET | Freq: Three times a day (TID) | ORAL | 1 refills | 10 days | Status: CP
Start: 2024-03-11 — End: ?

## 2024-03-11 NOTE — Unmapped (Signed)
 Addended by: Gabrielle Dare on: 03/11/2024 01:23 PM     Modules accepted: Orders

## 2024-03-12 MED ORDER — OZEMPIC 1 MG/DOSE (4 MG/3 ML) SUBCUTANEOUS PEN INJECTOR
SUBCUTANEOUS | 0 refills | 28.00 days | Status: CP
Start: 2024-03-12 — End: 2024-04-03

## 2024-03-13 MED FILL — HUMIRA PEN CITRATE FREE 40 MG/0.4 ML: SUBCUTANEOUS | 28 days supply | Qty: 4 | Fill #2

## 2024-03-23 ENCOUNTER — Ambulatory Visit: Admit: 2024-03-23 | Discharge: 2024-03-24

## 2024-03-23 DIAGNOSIS — H40003 Preglaucoma, unspecified, bilateral: Principal | ICD-10-CM

## 2024-03-23 NOTE — Unmapped (Signed)
 1. Glaucoma Suspect - cupping  -- Age: 63 y.o.  -- Race: white  -- Family History: -  -- Medical History: -  -- Medications:   Prior to Admission medications    Medication Sig Start Date End Date Taking? Authorizing Provider   acetaminophen (TYLENOL) 325 MG tablet Take 2 tablets (650 mg total) by mouth two (2) times a day as needed. 08/04/18   [provider]   albuterol HFA 90 mcg/actuation inhaler Inhale 2 puffs every six (6) hours as needed for wheezing.    [provider]   atorvastatin (LIPITOR) 40 MG tablet Take 1 tablet (40 mg total) by mouth daily. 03/10/24   Colford, Cristin Mulderig, MD   azithromycin (ZITHROMAX) 250 MG tablet TAKE 2 TABLETS BY MOUTH TODAY, THEN TAKE 1 TABLET DAILY FOR 4 DAYS AS DIRECTED 12/25/23   [provider]   baclofen (LIORESAL) 10 MG tablet Take 1 tablet (10 mg total) by mouth Three (3) times a day. 12/04/23 12/03/24  Zilphia Hilt, PA   cholecalciferol, vitamin D3-50 mcg, 2,000 unit,, 50 mcg (2,000 unit) tablet Take 1 tablet (50 mcg total) by mouth daily. 2 tablets daily    [provider]   ciprofloxacin HCl (CIPRO) 500 MG tablet Take 1 tablet (500 mg total) by mouth every twelve (12) hours.    [provider]   fluticasone propion-salmeterol (ADVAIR HFA) 115-21 mcg/actuation inhaler Inhale 2 puffs two (2) times a day. 02/26/24 02/25/25  Ilean Mall, MD   folic acid (FOLVITE) 1 MG tablet TAKE 1 TABLET BY MOUTH EVERY DAY 01/03/24   Jaycee Metro, DO   furosemide (LASIX) 40 MG tablet Take 1 tablet (40 mg total) by mouth daily. Take one tablet daily as needed for weight gain (goal weight ~248-251lbs)  Patient taking differently: Take 0.5 tablets (20 mg total) by mouth daily. Take one tablet daily as needed for edema. Do not administer if SBP<100. 07/25/22 02/26/24  Sturkie, Shirlene Doughty, MD   HUMIRA PEN CITRATE FREE 40 MG/0.4 ML Inject 0.4 mL (40 mg total) under the skin every seven (7) days. 01/16/24   Jaycee Metro, DO   inhalational spacing device (AEROCHAMBER MV) Spcr 1 each by Miscellaneous route daily. 07/22/23   Colford, Cristin Mulderig, MD   losartan-hydroCHLOROthiazide (HYZAAR) 50-12.5 mg per tablet Take 1 tablet by mouth daily. 03/10/24   Colford, Cristin Mulderig, MD   magnesium oxide (MAG-OX) 400 mg (241.3 mg elemental magnesium) tablet Take 1 tablet by mouth daily. 03/09/24 01/03/25  Colford, Cristin Mulderig, MD   methotrexate 2.5 MG tablet Take 8 tablets (20 mg total) by mouth once a week. 02/04/24   Jaycee Metro, DO   multivitamin (TAB-A-VITE/THERAGRAN) per tablet Take 1 tablet by mouth daily.    [provider]   omeprazole (PRILOSEC) 20 MG capsule Take 1 capsule (20 mg total) by mouth daily. 03/10/24 03/10/25  Colford, Cristin Mulderig, MD   penicillin v potassium (VEETID) 500 MG tablet Take 1 tablet (500 mg total) by mouth every eight (8) hours. 03/11/24   Colford, Cristin Mulderig, MD   semaglutide (OZEMPIC) 0.25 mg or 0.5 mg (2 mg/3 mL) PnIj Inject 0.25 mg under the skin every seven (7) days for 28 days, THEN 0.5 mg every seven (7) days for 28 days. 01/21/24 03/17/24  Colford, Cristin Mulderig, MD   semaglutide (OZEMPIC) 1 mg/dose (4 mg/3 mL) PnIj injection Inject 1 mg under the skin every seven (7) days for 4 doses.  Patient not taking:  Reported on 02/26/2024 03/12/24 04/03/24  Colford, Cristin Mulderig, MD   tiotropium bromide (SPIRIVA RESPIMAT) 2.5 mcg/actuation inhalation mist Inhale 2 puffs daily. 12/04/23   Erickson, Brittain Adelle, PA   umeclidinium-vilanteroL (ANORO ELLIPTA) 62.5-25 mcg/actuation inhaler Inhale 1 puff BY MOUTH daily. For benefit inquiry only. Please send Epic chat message to Sejal K with coverage information, then discontinue Rx. 01/29/22 01/29/22  Sturkie, Shirlene Doughty, MD     --Treatment history: -  -- Current Treatment:   No current outpatient medications on file. (ANTI-ULCER PREPARATIONS)       Current Outpatient Medications (Other)   Medication Sig Dispense Refill acetaminophen (TYLENOL) 325 MG tablet Take 2 tablets (650 mg total) by mouth two (2) times a day as needed.      albuterol HFA 90 mcg/actuation inhaler Inhale 2 puffs every six (6) hours as needed for wheezing.      atorvastatin (LIPITOR) 40 MG tablet Take 1 tablet (40 mg total) by mouth daily. 90 tablet 3    azithromycin (ZITHROMAX) 250 MG tablet TAKE 2 TABLETS BY MOUTH TODAY, THEN TAKE 1 TABLET DAILY FOR 4 DAYS AS DIRECTED      baclofen (LIORESAL) 10 MG tablet Take 1 tablet (10 mg total) by mouth Three (3) times a day. 30 tablet 1    cholecalciferol, vitamin D3-50 mcg, 2,000 unit,, 50 mcg (2,000 unit) tablet Take 1 tablet (50 mcg total) by mouth daily. 2 tablets daily      ciprofloxacin HCl (CIPRO) 500 MG tablet Take 1 tablet (500 mg total) by mouth every twelve (12) hours.      fluticasone propion-salmeterol (ADVAIR HFA) 115-21 mcg/actuation inhaler Inhale 2 puffs two (2) times a day. 12 g 11    folic acid (FOLVITE) 1 MG tablet TAKE 1 TABLET BY MOUTH EVERY DAY 90 tablet 3    furosemide (LASIX) 40 MG tablet Take 1 tablet (40 mg total) by mouth daily. Take one tablet daily as needed for weight gain (goal weight ~248-251lbs) (Patient taking differently: Take 0.5 tablets (20 mg total) by mouth daily. Take one tablet daily as needed for edema. Do not administer if SBP<100.)  0    HUMIRA PEN CITRATE FREE 40 MG/0.4 ML Inject 0.4 mL (40 mg total) under the skin every seven (7) days. 4 each 11    inhalational spacing device (AEROCHAMBER MV) Spcr 1 each by Miscellaneous route daily. 1 each 0    losartan-hydroCHLOROthiazide (HYZAAR) 50-12.5 mg per tablet Take 1 tablet by mouth daily. 90 tablet 3    magnesium oxide (MAG-OX) 400 mg (241.3 mg elemental magnesium) tablet Take 1 tablet by mouth daily. 90 tablet 2    methotrexate 2.5 MG tablet Take 8 tablets (20 mg total) by mouth once a week. 96 tablet 3    multivitamin (TAB-A-VITE/THERAGRAN) per tablet Take 1 tablet by mouth daily.      omeprazole (PRILOSEC) 20 MG capsule Take 1 capsule (20 mg total) by mouth daily. 90 capsule 3    penicillin v potassium (VEETID) 500 MG tablet Take 1 tablet (500 mg total) by mouth every eight (8) hours. 30 tablet 1    semaglutide (OZEMPIC) 1 mg/dose (4 mg/3 mL) PnIj injection Inject 1 mg under the skin every seven (7) days for 4 doses. (Patient not taking: Reported on 02/26/2024) 3 mL 0    tiotropium bromide (SPIRIVA RESPIMAT) 2.5 mcg/actuation inhalation mist Inhale 2 puffs daily. 16 g 11     -- TMax: 10//08 mmHg  -- Current IOP:   Tonometry  Tonometry ([facility], 12:26 PM)         Right Left    Pressure 10 08                  -- Goal IOP: <10mmHg  -- CCT:   Pachymetry       Pachymetry (03/23/2024)         Right Left    Thickness 521 526                  -- Gonioscopy:   Not recorded         -- Optic Nerves:    - OD: 0.5   - OS : 0.5     -- OCT RNFL: Full OU    -- HVF: Normal OU      -- Impression: No signs of glaucoma.  Cataracts both eyes -- we will evaluate next visit    -- Plan:  F/U in 1 month with V/T/MRX/ IOL Master/Topo/DFE     We discuss treatment plans. Patient understands therapeutic goals and shares in the commitment to reach these goals.

## 2024-03-24 ENCOUNTER — Ambulatory Visit: Admit: 2024-03-24 | Payer: BLUE CROSS/BLUE SHIELD

## 2024-03-24 NOTE — Unmapped (Signed)
 OUTPATIENT OCCUPATIONAL THERAPY    UPPER EXTREMITY TREATMENT    Patient Name: Cynthia Ramirez  Date of Birth:07-Sep-1961  Date: 03/24/2024  Visit #: 3  Insurance Type: BCBS  Plan of Care Certification Dates:     Encounter Diagnoses   Name Primary?    Rheumatoid arthritis involving multiple sites with positive rheumatoid factor Yes    Right wrist pain        Reason for referral:   Associated Diagnoses    Rheumatoid arthritis involving multiple sites with positive rheumatoid factor (CMS-HCC) [M05.79]  - Primary      Right wrist pain [M25.531]          Order Questions    Question Answer   Reason for referral: right wrist pain,RA   Suggest Treatment: Evaluation with suggestions for treatment    Splint Fabrication      Referring Provider: Jaycee Metro  Onset of Symptoms: gradual onset.  Diagnosed 3 years ago with RA  Per Referring Provider's note: 02/04/24  RHEUMATOLOGY OUTPATIENT FOLLOWUP VISIT     Assessment/Plan:  Cynthia Ramirez is a 63 y.o. 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 Taking methotrexate and Humira every 7 days.  Better control of RA with this combination.      Assessment/Plan:      Rheumatoid Arthritis  Chronic condition with elbow pain, knee stiffness, and thumb tendon issues. Improvement in nocturnal throbbing since starting weekly Humira weekly. 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 Thumb popping likely due to tendon issues. Vision problems unlikely to be related to Humira or methotrexate.  A lateral epicondylitis band can support the tendon and alleviate pain. Referral to a hand therapist for a custom brace due to difficulty finding a suitable off-the-shelf brace for her right wrist.  - Continue weekly Humira  - Continue methotrexate 20 mg po weekly  - Refer to occupational therapy for custom wrist brace  - Order blood work for methotrexate monitoring  - Refer to hand therapist for thumb tendon issue  - Recommend using a lateral epicondylitis band for elbow pain     Obesity  On Ozempic to aid in weight loss to reduce joint pain. Difficulty with injection process and no significant reduction in hunger yet. Dose will be increased gradually to improve efficacy. Emphasized importance of weight loss for reducing lower extremity joint pain.  - Continue Ozempic  - Monitor weight and adjust dosage as needed     Follow-up  - Schedule follow-up appointment in six months.         Interim History: Last seen in October.   Cynthia Ramirez is a 63 year old female with rheumatoid arthritis who presents with joint pain and stiffness. She is accompanied by her niece.     She experiences joint pain and stiffness, primarily in her left elbow and right knee. The stiffness in her knee feels like it 'won't move,' and she notes a sensation of her thumb 'popping out of place' when making a fist. No swelling or heat in her joints is reported.     She is currently on weekly Humira injections, which have alleviated her nighttime throbbing pain. She also takes methotrexate and has recently started Ozempic injections twice a week for weight management. She finds the Ozempic injections more challenging due to the visible needle.     Elbow pain has been present for a couple of months, particularly noticeable when using her  walker. She describes a grinding sensation in her shoulder, which she wonders might be related to her elbow pain. No trouble moving the elbow is reported, although pain is present.     Her thumb feels like it is 'popping out of place' when making a fist, suggesting possible tendon subluxation.     Her knee feels numb from the lower to the upper part, but it does not feel like it will give out. No heat is reported in the knee area.     She reports worsening vision, which she plans to discuss with her eye doctor, suspecting it may be related to her medications.          Communication preference: verbal, written, visual  Prognosis: good due to motivation    OT ASSESSMENT:   63 y.o. year old female with above diagnosis.  Patient requires skilled Occupational Therapy services for decreased range of motion, decreased strength, orthotic fit/management, impaired daily activities of living as appropriate.     Previous Level of Function: Pt was previously independent with all ADLs and IADLs prior to progression of RA, trigger thumb and tennis elbow.    CURRENT LEVEL OF FUNCTION  Social and Occupational: Patient is on disability and doesn't work.  She enjoys going out but reports difficulty due to having to use walker .  She also takes care of partner now because he is sick.  Leisure activities/Hobbies: none  Home function: Difficulty opening plastic containers, donning socks and pulling up pants, cutting food. Patient reports not having morning stiffness.        Low complexity: This patient demonstrates 1-3 performance deficits relating to physical, cognitive, and psychosocial skills resulting in activity limitations and/or participation restrictions.  This patient has no co-morbidities affecting occupational performance.  A problem focused assessment was performed.  Please refer to Current level of function section for further details.         PLAN  Short Term Goals:  1. In 1 session, patient will perform home exercise program with need for cuing to max IND with ADLs and IADLs. (met)  2. In 1 session, patient will demonstrate independent donning and doffing of orthotic to max joint integrity necessary for ADL completion. (met)  3. In 1 session, patient will verbalize proper care of orthotic and skin to max joint integrity necessary for ADL completion. (met)    Long Term Goals:   1. In 8 weeks, patient will perform upgraded home exercise program, to include progression to strengthening, independently  to max IND with ADLs and IADLs.  2. In 8 weeks, patient will demonstrate an average grip strength that is 65% of the contralateral side to max IND with I/ADLs, such as carrying/lifting heavy functional items.  3. In 8 weeks, patient will be independent with joint protection principles and use of adaptive equipment to be independent with I/ADLs.    OT  PLAN OF CARE:  Pt will participate in:  Self Care/Hometraining  Orthotic Fit/Management   Therapeutic Exercise  Therapeutic Activity   Neuromuscular Re-education  Ultrasound  Hot/Cold Pack  Electrical Stimulation  Iontophoresis  Orthotic/Prosthetic Measure and Fit   Joint Mobilization  Physical Performance Measure   Manual Therapy    Planned frequency and duration of treatment: 1x / week/ 8 weeks. Plan will be adjusted as necessary.     Patient in agreement with plan of care?: Yes        SUBJECTIVE:  Patient goals: to not drop things, stop triggering of thumb  Pain: 0/10 at rest.  Pain reported at right thumb and left epicondyle.    Sensation: Inconsistently having tingling in right fingers per patient report volar and dorsally.    Prior OT Service: yes    OBJECTIVE:  Precautions: per protocol    Upper Extremity Function:  Shoulder:  WNL    Elbow:  WFL but stiffness end range left flexion    Hand:   Pt is right hand dominant.               AROM (degrees) Date: 02/16/34  Right Date: 02/17/24  Left   Wrist     Extension/flexion 55/full 50/full   Radial/ulnar deviation     Forearm WNL WNL   Supination/pronation     Elbow WNL WNL but stiffness flexion end range   Ext/flex     Composite flex to [facility]   (cm lack)     Index Full but tight end range 0   Middle 0 0   Ring 0 Limited motion PIP flexion and extension end ranges   Small 0 0   Digit Extension full full   Thumb  Opposition  10/10 10/10     Digit ROM Index finger Long finger Ring finger Small finger   MCP ext/flex       PIP ext/flex       DIP ext/flex       TAM total         Grip and Pinch Strength Date:02/17/24  Right Date:02/17/24  Left   Gross Grip Strength (lbs)  Position 2 Female -  age 70-64 R: 37-77 L: 29-66    Trial 1 30 25    Trial 2 32 24   Trial 3 35 24        Average Pinch Strength (lbs)     Lateral Pinch     3-point Pinch     Tip Pinch       Wound/Incision(s) or Scar: n/a    Edema: none  No complaint of morning stiffness.    TREATMENT:  Patient reports: triggering has decreased and tennis elbow has resolved.    Paraffin (8 minutes)  Patient provided with paraffin applied to right hand to start of exercises to increase blood flow, tissue extensibility, and stiffness to prepare the affected area to start therapy session. Patient instructed to dip 6 times and instructed not to move the digits with each dip to avoid burning. Patient's skin was intact prior to and after application.    Self Care/home training (35 minutes):  Therapist issued HEP with patient demonstration (see below) with handouts provided to the patient.   Educated pt on ergonomics, RA, trigger thumb, carpal tunnel and lateral epicondylitis and healing time-lines.  Continued with the following home training activities:  -PROM exercises for right PIP and left long finger (flexion and extension)  -Reviewed trigger thumb massage instruction  -Checked splint for trigger thumb and made slight adjustment  -reviewed instructions for tennis elbow stretches and massage  -Provided written and verbal instructions on joint protection.  Discussed issues with specific ADLs  -Provided built up handles to assist with ADLs.    Provided home program instructions for all issues and patient was without further questions.    Patient to return as needed.       [facility] Program:   Apply low to moderate heat 10 min prior to exercises for improved tissue extensibility    Access Code: [email or web address]  URL: [email or web address]  Date: 03/03/2024  Prepared by: Edwina Gram  Cynthia Ramirez    Patient Education  - Carpal Tunnel Syndrome  - What Is Carpal Tunnel Syndrome?  - Tennis Elbow  - What Are Trigger Finger and Trigger Thumb?    I reviewed the no-show/attendance policy with the patient and caregiver(s). The family is aware that they must call to cancel appointments more than 24 hours in advance. They are also aware that if they late cancel or no-show three times, we reserve the right to cancel their remaining appointments. This policy is in place to allow us  to best serve the needs of our caseload.    TREATMENT RENDERED:  Self Care/Home Training: 35 min  Parrafin: 8 min    Total Treatment Time: 43 mins      Patient Education:  Topics: home program, disease process  Education Provided to: patient  Education Type: education, demonstration, literature  Response to education/teachback: verbal understanding received, return demonstration    PAST MEDICAL HISTORY:  Reviewed   Past Medical History:   Diagnosis Date    Abnormal mammogram     At risk for falls     Cataract last eye appointment showed lite catact    Cognitive impairment     has a hard time remembering    COPD (chronic obstructive pulmonary disease)     COPD (chronic obstructive pulmonary disease) 03/06/2018    Diabetes mellitus     Dizziness     Emphysema of lung see [place] chart for info    see [place] chart for info    Essential hypertension, benign     Family history of diabetes mellitus (DM)     Financial difficulties     Hearing impairment     has a hole in her eardrum    HL (hearing loss)     Hyperlipidemia     in my chart    Impaired mobility     Lack of access to transportation     Nonadherence to medication     Obesity yes over weight    Problem with transportation     Rheumatoid arthritis 2017    Marked elevation of rheumatoid factor 130.5  and CCP>250    Tobacco use disorder     Visual impairment     hard to see per patient       Past Surgical History: Reviewed  Past Surgical History:   Procedure Laterality Date    BREAST BIOPSY Left 02/2016    HYSTERECTOMY      TAH before age 17    INNER EAR SURGERY Bilateral     2004 and earlier -- unspecified nature of surgery    PILONIDAL CYST DRAINAGE      PR COLONOSCOPY FLX DX W/COLLJ SPEC WHEN PFRMD N/A 03/13/2016    Procedure: COLONOSCOPY, FLEXIBLE, PROXIMAL TO SPLENIC FLEXURE; DIAGNOSTIC, W/WO COLLECTION SPECIMEN BY BRUSH OR WASH;  Surgeon: Murtis Arthur, MD;  Location: [facility];  Service: Gastroenterology    PR FEMORAL FX, OPEN TX Right 07/19/2022    Procedure: HEMI OPEN TREATMENT OF FEMORAL FRACTURE, PROXIMAL END, NECK, INTERNAL FIXATION OR PROSTHETIC REPLACEMENT - modifier 22;  Surgeon: Roselie Conger, MD;  Location: MAIN OR [facility];  Service: Orthopedics    right hip surgery Right 07/19/2022    TONSILLECTOMY Bilateral 1973 approx       Allergies: Reviewed  Doxycycline hcl, Shellfish containing products, and Sulfa (sulfonamide antibiotics)    Medications: Reviewed    Current Outpatient Medications:     acetaminophen (TYLENOL) 325 MG tablet, Take  2 tablets (650 mg total) by mouth two (2) times a day as needed., Disp: , Rfl:     albuterol HFA 90 mcg/actuation inhaler, Inhale 2 puffs every six (6) hours as needed for wheezing., Disp: , Rfl:     atorvastatin (LIPITOR) 40 MG tablet, Take 1 tablet (40 mg total) by mouth daily., Disp: 90 tablet, Rfl: 3    azithromycin (ZITHROMAX) 250 MG tablet, TAKE 2 TABLETS BY MOUTH TODAY, THEN TAKE 1 TABLET DAILY FOR 4 DAYS AS DIRECTED, Disp: , Rfl:     baclofen (LIORESAL) 10 MG tablet, Take 1 tablet (10 mg total) by mouth Three (3) times a day., Disp: 30 tablet, Rfl: 1    cholecalciferol, vitamin D3-50 mcg, 2,000 unit,, 50 mcg (2,000 unit) tablet, Take 1 tablet (50 mcg total) by mouth daily. 2 tablets daily, Disp: , Rfl:     ciprofloxacin HCl (CIPRO) 500 MG tablet, Take 1 tablet (500 mg total) by mouth every twelve (12) hours., Disp: , Rfl:     fluticasone propion-salmeterol (ADVAIR HFA) 115-21 mcg/actuation inhaler, Inhale 2 puffs two (2) times a day., Disp: 12 g, Rfl: 11    folic acid (FOLVITE) 1 MG tablet, TAKE 1 TABLET BY MOUTH EVERY DAY, Disp: 90 tablet, Rfl: 3    furosemide (LASIX) 40 MG tablet, Take 1 tablet (40 mg total) by mouth daily. Take one tablet daily as needed for weight gain (goal weight ~248-251lbs) (Patient taking differently: Take 0.5 tablets (20 mg total) by mouth daily. Take one tablet daily as needed for edema. Do not administer if SBP<100.), Disp: , Rfl: 0    HUMIRA PEN CITRATE FREE 40 MG/0.4 ML, Inject 0.4 mL (40 mg total) under the skin every seven (7) days., Disp: 4 each, Rfl: 11    inhalational spacing device (AEROCHAMBER MV) Spcr, 1 each by Miscellaneous route daily., Disp: 1 each, Rfl: 0    losartan-hydroCHLOROthiazide (HYZAAR) 50-12.5 mg per tablet, Take 1 tablet by mouth daily., Disp: 90 tablet, Rfl: 3    magnesium oxide (MAG-OX) 400 mg (241.3 mg elemental magnesium) tablet, Take 1 tablet by mouth daily., Disp: 90 tablet, Rfl: 2    methotrexate 2.5 MG tablet, Take 8 tablets (20 mg total) by mouth once a week., Disp: 96 tablet, Rfl: 3    multivitamin (TAB-A-VITE/THERAGRAN) per tablet, Take 1 tablet by mouth daily., Disp: , Rfl:     omeprazole (PRILOSEC) 20 MG capsule, Take 1 capsule (20 mg total) by mouth daily., Disp: 90 capsule, Rfl: 3    penicillin v potassium (VEETID) 500 MG tablet, Take 1 tablet (500 mg total) by mouth every eight (8) hours., Disp: 30 tablet, Rfl: 1    semaglutide (OZEMPIC) 1 mg/dose (4 mg/3 mL) PnIj injection, Inject 1 mg under the skin every seven (7) days for 4 doses. (Patient not taking: Reported on 02/26/2024), Disp: 3 mL, Rfl: 0    tiotropium bromide (SPIRIVA RESPIMAT) 2.5 mcg/actuation inhalation mist, Inhale 2 puffs daily., Disp: 16 g, Rfl: 11    I attest that I have reviewed the above information.  Signed: Tresia Fruit, OT  03/24/2024 12:53 PM

## 2024-03-31 NOTE — Unmapped (Signed)
 [facility] is requesting additional info for Rehab

## 2024-04-07 NOTE — Unmapped (Signed)
 [facility] Specialty and Home Delivery Pharmacy Refill Coordination Note    Specialty Medication(s) to be Shipped:   Inflammatory Disorders: Humira     Other medication(s) to be shipped: No additional medications requested for fill at this time     Cynthia Ramirez, DOB: December 18, 1960  Phone: [phone] (home)       All above HIPAA information was verified with patient.     Was a Nurse, learning disability used for this call? No    Completed refill call assessment today to schedule patient's medication shipment from the [facility]  [phone]).  All relevant notes have been reviewed.     Specialty medication(s) and dose(s) confirmed: Regimen is correct and unchanged.   Changes to medications: Cynthia Ramirez reports no changes at this time.  Changes to insurance: No  New side effects reported not previously addressed with a pharmacist or physician: None reported  Questions for the pharmacist: No    Confirmed patient received a Conservation officer, historic buildings and a Surveyor, mining with first shipment. The patient will receive a drug information handout for each medication shipped and additional FDA Medication Guides as required.       DISEASE/MEDICATION-SPECIFIC INFORMATION        For patients on injectable medications: Patient currently has 1 doses left.  Next injection is scheduled for 04/13/2024.    SPECIALTY MEDICATION ADHERENCE     Medication Adherence    Patient reported X missed doses in the last month: 0  Specialty Medication: HUMIRA (CF) PEN 40 mg/0.4 mL injection (adalimumab )  Patient is on additional specialty medications: No              Were doses missed due to medication being on hold? No    HUMIRA (CF) PEN 40 mg/0.4 mL injection (adalimumab )  1 doses of medicine on hand     REFERRAL TO PHARMACIST     Referral to the pharmacist: Not needed      [facility]     Shipping address confirmed in Epic.     Cost and Payment: Patient has a $0 copay, payment information is not required.    Delivery Scheduled: Yes, Expected medication delivery date: 04/13/2024.     Medication will be delivered via Same Day Courier to the prescription address in Epic [facility].    [facility]  Specialty Technician

## 2024-04-13 MED FILL — HUMIRA PEN CITRATE FREE 40 MG/0.4 ML: SUBCUTANEOUS | 28 days supply | Qty: 4 | Fill #3

## 2024-04-13 NOTE — Unmapped (Signed)
 Addended by: Santina Cull on: 04/13/2024 02:49 PM     Modules accepted: Level of Service

## 2024-04-20 ENCOUNTER — Ambulatory Visit: Admit: 2024-04-20 | Discharge: 2024-04-21 | Payer: BLUE CROSS/BLUE SHIELD

## 2024-04-20 NOTE — Unmapped (Signed)
 1. Cataracts, NS  AMD Dry both eyes -> refer to retina    -- the left eye Visually significant Cataracts  -- the left eye Difficulty with: reading and watching TV  -- Lens: OD: N1  OS: N2  -- Pupil dilation: Good  -- Pseudoexfoliation?: no  -- Flomax?: no  -- Trauma?: no  -- CL use?: no  -- Prior refractive surgery?: no      -- Assessment: Cataract left eye --> patient does not want surgery for now        -- Plan:  We will wait for cataract surgery.  Refer patient to retina for AMD evaluation

## 2024-04-30 ENCOUNTER — Encounter
Admit: 2024-04-30 | Discharge: 2024-04-30 | Payer: BLUE CROSS/BLUE SHIELD | Attending: Student in an Organized Health Care Education/Training Program | Primary: Student in an Organized Health Care Education/Training Program

## 2024-04-30 ENCOUNTER — Inpatient Hospital Stay: Admit: 2024-04-30 | Discharge: 2024-04-30 | Payer: BLUE CROSS/BLUE SHIELD

## 2024-04-30 MED ADMIN — sodium chloride (NS) 0.9 % infusion: 10 mL/h | INTRAVENOUS | @ 18:00:00 | Stop: 2024-04-30

## 2024-04-30 MED ADMIN — Propofol (DIPRIVAN) injection: INTRAVENOUS | @ 18:00:00 | Stop: 2024-04-30

## 2024-04-30 MED ADMIN — lidocaine (PF) (XYLOCAINE-MPF) 20 mg/mL (2 %) injection: INTRAVENOUS | @ 18:00:00 | Stop: 2024-04-30

## 2024-04-30 MED ADMIN — sodium chloride (NS) 0.9 % infusion: 10 mL/h | INTRAVENOUS | @ 17:00:00 | Stop: 2024-04-30

## 2024-05-04 NOTE — Unmapped (Signed)
 [facility] Special[facility] and [facility] Delivery Pharmacy Refill Coordination Note    Specialty Medication(s) to be Shipped:   Inflammatory Disorders: Humira    Other medication(s) to be shipped: No additional medications requested for fill at this time     Cynthia Ramirez, DOB: 10-Nov-1961  Phone: [phone] (home)       All above HIPAA information was verified with patient.     Was a Nurse, learning disability used for this call? No    Completed refill call assessment today to schedule patient's medication shipment from the [facility]  [phone]).  All relevant notes have been reviewed.     Specialty medication(s) and dose(s) confirmed: Regimen is correct and unchanged.   Changes to medications: Cynthia Ramirez reports no changes at this time.  Changes to insurance: No  New side effects reported not previously addressed with a pharmacist or physician: None reported  Questions for the pharmacist: No    Confirmed patient received a Conservation officer, historic buildings and a Surveyor, mining with first shipment. The patient will receive a drug information handout for each medication shipped and additional FDA Medication Guides as required.       DISEASE/MEDICATION-SPECIFIC INFORMATION        For patients on injectable medications: Patient currently has 1 doses left.  Next injection is scheduled for 05/11/2024.    SPECIALTY MEDICATION ADHERENCE     Medication Adherence    Patient reported X missed doses in the last month: 0  Specialty Medication: Humira 40 mg/0.4 mL  Patient is on additional specialty medications: No  Informant: patient     Were doses missed due to medication being on hold? No    HumiraPEN 40 mg/0.4 mL: 1 doses of medicine on hand     REFERRAL TO PHARMACIST     Referral to the pharmacist: Not needed      [facility]     Shipping address confirmed in Epic.     Cost and Payment: Patient has a $0 copay, payment information is not required.    Delivery Scheduled: Yes, Expected medication delivery date: 05/14/2024. Medication will be delivered via Same Day Courier to the prescription address in Epic [place].    Cynthia Ramirez   [place] [facility]  Specialty Technician

## 2024-05-05 DIAGNOSIS — Z1231 Encounter for screening mammogram for malignant neoplasm of breast: Principal | ICD-10-CM

## 2024-05-10 ENCOUNTER — Ambulatory Visit
Admit: 2024-05-10 | Discharge: 2024-05-11 | Payer: BLUE CROSS/BLUE SHIELD | Attending: Student in an Organized Health Care Education/Training Program | Primary: Student in an Organized Health Care Education/Training Program

## 2024-05-10 DIAGNOSIS — H6691 Otitis media, unspecified, right ear: Principal | ICD-10-CM

## 2024-05-10 MED ORDER — OFLOXACIN 0.3 % EAR DROPS
OPHTHALMIC | 0 refills | 0.00000 days | Status: CP
Start: 2024-05-10 — End: ?

## 2024-05-10 MED ORDER — AMOXICILLIN 875 MG-POTASSIUM CLAVULANATE 125 MG TABLET
ORAL_TABLET | Freq: Two times a day (BID) | ORAL | 0 refills | 7.00000 days | Status: CP
Start: 2024-05-10 — End: 2024-05-17

## 2024-05-10 NOTE — Unmapped (Signed)
 ASSESSMENT/PLAN:     Pt with right otitis media. Augmentin prescribed. Pt advised on signs and symptoms that would warrant returning to Urgent Care or Emergency Department. Pt voices understanding. Pt adivsed to return to Urgent Care or Emergency Department if symptoms persist or worsen.      Diagnosis ICD-10-CM Associated Orders   1. Acute right otitis media  H66.91 amoxicillin-clavulanate (AUGMENTIN) 875-125 mg per tablet        Requested Prescriptions     Signed Prescriptions Disp Refills    amoxicillin-clavulanate (AUGMENTIN) 875-125 mg per tablet 14 tablet 0     Sig: Take 1 tablet by mouth two (2) times a day for 7 days.    ofloxacin (FLOXIN) 0.3 % otic solution 10 mL 0     Sig: Instill 10 drops into the affected ear(s) once daily for 7 days.     Instructions about new medications and side effects provided.  If any changes in chronic medications then new reconciled medication list is given to patient.     CHIEF COMPLAINT:     Chief Complaint   Patient presents with    Ear Drainage     Right Ear infection? / draining /  pain for a week        SUBJECTIVE/HPI:   63 y.o. Female presents with right ear pain x1 week. Pt has taken OTC medication for relief. Pt denies fever.    Past Medical History:   Diagnosis Date    Abnormal mammogram     At risk for falls     Cataract last eye appointment showed lite catact    Cognitive impairment     has a hard time remembering    COPD (chronic obstructive pulmonary disease)       COPD (chronic obstructive pulmonary disease)   03/06/2018    Diabetes mellitus       Dizziness     Emphysema of lung   see [place] chart for info    see  chart for info    Essential hypertension, benign     Family history of diabetes mellitus (DM)     Financial difficulties     Hearing impairment     has a hole in her eardrum    HL (hearing loss)     Hyperlipidemia     in my chart    Impaired mobility     Lack of access to transportation     Nonadherence to medication     Obesity yes over weight Problem with transportation     Rheumatoid arthritis   2017    Marked elevation of rheumatoid factor 130.5  and CCP>250    Tobacco use disorder     Visual impairment     hard to see per patient     Allergies   Allergen Reactions    Doxycycline Hcl Hives    Shellfish Containing Products Swelling and Other (See Comments)     Swelling of lips, no swelling of tongue or difficulty breathing.    Swelling of lips, no swelling of tongue or difficulty breathing.UnknownUnknownSwelling of lips, no swelling of tongue or difficulty breathing. Unknown    Swelling of lips, no swelling of tongue or difficulty breathing.   Unknown   Unknown   Swelling of lips, no swelling of tongue or difficulty breathing.    Sulfa (Sulfonamide Antibiotics) Other (See Comments) and Nausea Only     Stomach issue     Social History     Socioeconomic History  Marital status: Media planner     Spouse name: None    Number of children: None    Years of education: None    Highest education level: None   Tobacco Use    Smoking status: Former     Current packs/day: 0.00     Average packs/day: 1 pack/day for 40.0 years (40.0 ttl pk-yrs)     Types: Cigarettes     Start date: 01/19/1982     Quit date: 01/19/2022     Years since quitting: 2.3     Passive exposure: Past    Smokeless tobacco: Never    Tobacco comments:     Pt stated stopped since 01/21/22   Vaping Use    Vaping status: Never Used   Substance and Sexual Activity    Alcohol use: Not Currently    Drug use: No   Other Topics Concern    Do you use sunscreen? Yes    Tanning bed use? No    Are you easily burned? No    Excessive sun exposure? No    Blistering sunburns? No   Social History Narrative    Family, social, cultural characteristics: social support includes partner .      Patient has the following communication needs: none disclosed     Health Literacy: How confident are you that you understand your health issues/concerns, can participate in your care, and manage your care along with your physician: confident.    Behaviors Affecting Health: none, per patient    Family history of mental health illness and/or substance abuse: asked patient/parent and none disclosed.    Have you been seen by any medical provider that we have not referred you to since your last visit ? Yes       Discussed a Living Will with the patient and the : Patient is under the age of 4.      Social Drivers of Health     Financial Resource Strain: High Risk (06/10/2023)    Overall Financial Resource Strain (CARDIA)     Difficulty of Paying Living Expenses: Very hard   Food Insecurity: Food Insecurity Present (06/10/2023)    Hunger Vital Sign     Worried About Running Out of Food in the Last Year: Sometimes true     Ran Out of Food in the Last Year: Sometimes true   Transportation Needs: Unmet Transportation Needs (06/10/2023)    PRAPARE - Transportation     Lack of Transportation (Medical): Yes     Lack of Transportation (Non-Medical): Yes   Physical Activity: Inactive (06/10/2023)    Exercise Vital Sign     Days of Exercise per Week: 0 days     Minutes of Exercise per Session: 0 min   Stress: Stress Concern Present (06/10/2023)    [employer] of Occupational Health - Occupational Stress Questionnaire     Feeling of Stress : To some extent   Social Connections: Socially Isolated (06/10/2023)    Social Connection and Isolation Panel [NHANES]     Frequency of Communication with Friends and Family: Never     Frequency of Social Gatherings with Friends and Family: Never     Attends Religious Services: Never     Database administrator or Organizations: No     Attends Banker Meetings: Never     Marital Status: Living with partner   Housing: High Risk (06/10/2023)    Housing     Within the past 12 months, have you  ever stayed: outside, in a car, in a tent, in an overnight shelter, or temporarily in someone else's home (i.e. couch-surfing)?: No     Are you worried about losing your housing?: Yes     Family History   Problem Relation Age of Onset    Cancer Mother     Colon cancer Mother     Heart disease Father     Heart disease Sister     Diabetes Brother     Diabetes Brother     Diabetes Brother     Breast cancer Neg Hx     Melanoma Neg Hx     Basal cell carcinoma Neg Hx     Squamous cell carcinoma Neg Hx        ROS:   Review of Systems   HENT:  Positive for ear pain.      See Subjective/HPI  Medications, Allergies and Problem List personally reviewed in Epic today    OBJECTIVE:          05/10/24 1028   BP: 116/78   Pulse: 62   Resp: 19   Temp: 36.7 ??C (98.1 ??F)   SpO2: 92%     Physical Exam  Vitals reviewed.   Constitutional:       General: She is not in acute distress.     Appearance: Normal appearance.   HENT:      Head: Normocephalic.      Right Ear: Ear canal and external ear normal. Tympanic membrane is erythematous.      Left Ear: Tympanic membrane, ear canal and external ear normal.      Nose: Nose normal.      Mouth/Throat:      Pharynx: Oropharynx is clear.   Eyes:      Conjunctiva/sclera: Conjunctivae normal.   Cardiovascular:      Rate and Rhythm: Normal rate and regular rhythm.      Heart sounds: Normal heart sounds.   Pulmonary:      Effort: Pulmonary effort is normal. No respiratory distress.      Breath sounds: Normal breath sounds.   Skin:     General: Skin is warm and dry.   Neurological:      Mental Status: She is alert.          No results found.     LABS/X-RAYS/EKG/MEDS:       No results found for this visit on 05/10/24.    No results found.      Follow-up with PCP      A copy of these instructions have been given to the patient or responsible adult who demonstrated the ability to learn, asked appropriate questions, and verbalized understanding of the plan of care.  There were no barriers to learning identified.    Please take the time to sign up for a MyChart Account. See sign up information in your After Visit Summary. You will be able to view lab results, make appointments, communicate with providers, and much more.

## 2024-05-14 MED FILL — HUMIRA PEN CITRATE FREE 40 MG/0.4 ML: SUBCUTANEOUS | 28 days supply | Qty: 4 | Fill #4

## 2024-06-01 NOTE — Unmapped (Signed)
 [facility] Specialty and Home Delivery Pharmacy Refill Coordination Note    Specialty Medication(s) to be Shipped:   Inflammatory Disorders: Humira    Other medication(s) to be shipped: No additional medications requested for fill at this time     Cynthia Ramirez, DOB: 02/27/1961  Phone: [phone] (home)       All above HIPAA information was verified with patient.     Was a Nurse, learning disability used for this call? No    Completed refill call assessment today to schedule patient's medication shipment from the [facility]  [phone]).  All relevant notes have been reviewed.     Specialty medication(s) and dose(s) confirmed: Regimen is correct and unchanged.   Changes to medications: Cynthia Ramirez reports no changes at this time.  Changes to insurance: No  New side effects reported not previously addressed with a pharmacist or physician: None reported  Questions for the pharmacist: No    Confirmed patient received a Conservation officer, historic buildings and a Surveyor, mining with first shipment. The patient will receive a drug information handout for each medication shipped and additional FDA Medication Guides as required.       DISEASE/MEDICATION-SPECIFIC INFORMATION        N/A    SPECIALTY MEDICATION ADHERENCE     Medication Adherence    Patient reported X missed doses in the last month: 0  Specialty Medication: HUMIRA(CF) PEN 40 mg/0.4 mL injection (adalimumab)  Patient is on additional specialty medications: No              Were doses missed due to medication being on hold? No    HUMIRA(CF) PEN 40 mg/0.4 mL injection (adalimumab): 0 doses of medicine on hand       REFERRAL TO PHARMACIST     Referral to the pharmacist: Not needed      [facility]     Shipping address confirmed in Epic.     Cost and Payment: Patient has a $0 copay, payment information is not required.    Delivery Scheduled: Yes, Expected medication delivery date: 06/08/24.     Medication will be delivered via Same Day Courier to the prescription address in Epic WAM.    Chane Cowden   [facility] Delivery Pharm[facility]  Specialty Technician

## 2024-06-05 ENCOUNTER — Inpatient Hospital Stay: Admit: 2024-06-05 | Discharge: 2024-06-06 | Payer: BLUE CROSS/BLUE SHIELD

## 2024-06-08 MED FILL — HUMIRA PEN CITRATE FREE 40 MG/0.4 ML: SUBCUTANEOUS | 28 days supply | Qty: 4 | Fill #5

## 2024-06-16 NOTE — Unmapped (Addendum)
 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 Small IRF over atrophic areas.  Discussed compliment inhibition.     Cataracts - early, seeing Dr. Edmon. Recommend refraction.     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 Edmon    INTERPRETATION EXTENDED OPHTHALMOSCOPT MACULA (90Dlens)  Macula - right:  drusen, patches of atrophy  Macula - left:  drusen, patches of atrophy    Dr edmon cc    I saw and evaluated the patient, participating in the key portions of the service.  I reviewed the resident???s note. I agree with the resident???s findings and plan. I provided the required supervision for all services billed. I independently reviewed the image(s) and agree with the interpretation and plan as documented in the resident???s note.

## 2024-06-29 NOTE — Unmapped (Signed)
 [facility] Specialty and Home Delivery Pharmacy Refill Coordination Note    Specialty Medication(s) to be Shipped:   Inflammatory Disorders: Humira     Other medication(s) to be shipped: No additional medications requested for fill at this time     Cynthia Ramirez, DOB: 08-Dec-1961  Phone: [phone] (home)       All above HIPAA information was verified with patient.     Was a Nurse, learning disability used for this call? No    Completed refill call assessment today to schedule patient's medication shipment from the [facility] Delivery Pharm[facility]  [phone]).  All relevant notes have been reviewed.     Specialty medication(s) and dose(s) confirmed: Regimen is correct and unchanged.   Changes to medications: Crislyn reports no changes at this time.  Changes to insurance: No  New side effects reported not previously addressed with a pharmacist or physician: None reported  Questions for the pharmacist: No    Confirmed patient received a Conservation officer, historic buildings and a Surveyor, mining with first shipment. The patient will receive a drug information handout for each medication shipped and additional FDA Medication Guides as required.       DISEASE/MEDICATION-SPECIFIC INFORMATION        For patients on injectable medications: Patient currently has 1 doses left.  Next injection is scheduled for 7/21.    SPECIALTY MEDICATION ADHERENCE     Medication Adherence    Patient reported X missed doses in the last month: 0  Specialty Medication: adalimumab : HUMIRA (CF) PEN 40 mg/0.4 mL injection  Patient is on additional specialty medications: No  Patient is on more than two specialty medications: No  Any gaps in refill history greater than 2 weeks in the last 3 months: no  Demonstrates understanding of importance of adherence: yes  Informant: patient  Confirmed plan for next specialty medication refill: delivery by pharmacy  Refills needed for supportive medications: not needed          Refill Coordination    Has the Patients' Contact Information Changed: No  Is the Shipping Address Different: No         Were doses missed due to medication being on hold? No    adalimumab : HUMIRA (CF) PEN 40 /0.4  mg/ml: 1 doses of medicine on hand       REFERRAL TO PHARMACIST     Referral to the pharmacist: Not needed      SHIPPING     Shipping address confirmed in Epic.     Cost and Payment: Patient has a $0 copay, payment information is not required.    Delivery Scheduled: Yes, Expected medication delivery date: 07/02/24.     Medication will be delivered via Same Day Courier to the prescription address in Epic WAM.    [name]   [facility] Delivery Pharm[facility]  Specialty Technician

## 2024-06-30 ENCOUNTER — Encounter: Admit: 2024-06-30 | Discharge: 2024-06-30 | Payer: BLUE CROSS/BLUE SHIELD

## 2024-06-30 DIAGNOSIS — Z23 Encounter for immunization: Principal | ICD-10-CM

## 2024-06-30 DIAGNOSIS — E119 Type 2 diabetes mellitus without complications: Principal | ICD-10-CM

## 2024-06-30 DIAGNOSIS — I5032 Chronic diastolic (congestive) heart failure: Principal | ICD-10-CM

## 2024-06-30 DIAGNOSIS — J449 Chronic obstructive pulmonary disease, unspecified: Principal | ICD-10-CM

## 2024-06-30 DIAGNOSIS — Z636 Dependent relative needing care at home: Principal | ICD-10-CM

## 2024-06-30 DIAGNOSIS — M0579 Rheumatoid arthritis with rheumatoid factor of multiple sites without organ or systems involvement: Principal | ICD-10-CM

## 2024-06-30 LAB — BASIC METABOLIC PANEL
ANION GAP: 9 mmol/L (ref 5–14)
BLOOD UREA NITROGEN: 19 mg/dL (ref 9–23)
BUN / CREAT RATIO: 24
CALCIUM: 8.9 mg/dL (ref 8.7–10.4)
CHLORIDE: 104 mmol/L (ref 98–107)
CO2: 27.6 mmol/L (ref 20.0–31.0)
CREATININE: 0.78 mg/dL (ref 0.55–1.02)
EGFR CKD-EPI (2021) FEMALE: 85 mL/min/1.73m2 (ref >=60)
GLUCOSE RANDOM: 102 mg/dL (ref 70–179)
POTASSIUM: 3.9 mmol/L (ref 3.4–4.8)
SODIUM: 141 mmol/L (ref 135–145)

## 2024-06-30 LAB — HEMOGLOBIN A1C
ESTIMATED AVERAGE GLUCOSE: 120 mg/dL
HEMOGLOBIN A1C: 5.8 % — ABNORMAL HIGH (ref 4.8–5.6)

## 2024-06-30 MED ORDER — BACLOFEN 10 MG TABLET
ORAL_TABLET | Freq: Three times a day (TID) | ORAL | 1 refills | 10.00000 days | Status: CP
Start: 2024-06-30 — End: 2025-06-30

## 2024-06-30 MED ORDER — OZEMPIC 1 MG/DOSE (4 MG/3 ML) SUBCUTANEOUS PEN INJECTOR
SUBCUTANEOUS | 11 refills | 28.00000 days | Status: CP
Start: 2024-06-30 — End: ?

## 2024-06-30 MED ORDER — FOLIC ACID 1 MG TABLET
ORAL_TABLET | Freq: Every day | ORAL | 3 refills | 90.00000 days | Status: CP
Start: 2024-06-30 — End: ?

## 2024-06-30 NOTE — Unmapped (Deleted)
 Orders:    semaglutide  (OZEMPIC ) 1 mg/dose (4 mg/3 mL) PnIj injection; Inject 1 mg under the skin every seven (7) days.    Hemoglobin A1c    Basic Metabolic Panel

## 2024-06-30 NOTE — Unmapped (Addendum)
 Shingrix vaccine today  Next in 2 months    Increase Ozempic  to 1 mg weekly    [facility]

## 2024-06-30 NOTE — Unmapped (Signed)
 [name] Internal Medicine (Faculty Practice) Clinic Visit    Reason for Visit: Follow up DM    A/P:  Assessment & Plan  Type 2 Diabetes Mellitus  Managed with semaglutide , well-tolerated, with weight reduction from 257 lbs to 248 lbs.   - Increase Ozempic  dose from 0.5 mg to 1 mg.  - Order A1c and labs to monitor diabetes control.    Heart Failure with Preserved Ejection Fraction (HFpEF)  Well-controlled with current medications. Uses furosemide  as needed for fluid retention.  - Continue current heart failure management.  - Use furosemide  as needed for fluid retention.    Chronic Obstructive Pulmonary Disease (COPD)  Managed with CPAP. Evaluating need for oxygen with CPAP. Pulmonary team assessing oxygen needs.  - Continue CPAP therapy.  - Evaluate need for oxygen with CPAP.    Rheumatoid Arthritis  Improved and well managed with Humira .   - Continue Humira  as prescribed.    General Health Maintenance  Up to date with screenings. Considering shingles vaccine before Medicare switch.  - Administer Shingrix vaccine today.  - Consider COVID booster and flu vaccine in the fall.    Follow-up  Overall doing well. Considering Medicare plan options. Discussed Medicare Advantage vs. traditional Medicare.  - Follow up in 4-6 months.  - Review Medicare plan options for medication coverage.      No follow-ups on file.    I personally spent 34 minutes face-to-face and non-face-to-face in the care of this patient, which includes all pre, intra, and post visit time on the date of service.    __________________________________________________________    HPI:    History of Present Illness  Cynthia Ramirez is a 63 year old female with COPD, type two diabetes, obesity, rheumatoid arthritis, and heart failure with preserved ejection fraction who presents for follow-up.    She manages type two diabetes and obesity with semaglutide , currently weighing between 248 and 250 pounds. Her dose has increased to 0.5 mg without significant side effects. Heartburn is controlled with omeprazole , and she occasionally uses furosemide  for foot swelling due to fluid retention. Rheumatoid arthritis is managed with Humira , administered weekly. She is concerned about transitioning to Medicare and its impact on medication coverage, particularly for Humira . She uses a CPAP machine for sleep apnea and mobility aids, including a cane and walker. She has not received the shingles vaccine but has had the RSV vaccine and is considering a COVID booster.    __________________________________________________________    Problem List:  Problem List[1]    Medications:  Reviewed in EPIC    ROS: remainder of review of systems negative  __________________________________________________________    Physical Exam:   Vital Signs:  Vitals:    06/30/24 1040   BP: 104/60   BP Site: L Arm   BP Position: Sitting   BP Cuff Size: Large   Pulse: 68   Temp: 36.7 ??C (98 ??F)   TempSrc: Oral   SpO2: (!) 89%   Weight: (!) 113.5 kg (250 lb 3.2 oz)   Height: 167.6 cm (5' 6)      Wt Readings from Last 3 Encounters:   06/30/24 (!) 113.5 kg (250 lb 3.2 oz)   04/30/24 (!) 112 kg (247 lb)   02/26/24 (!) 115.7 kg (255 lb)        Body mass index is 40.38 kg/m??.  Physical Exam  MEASUREMENTS: Weight- 248-250, BMI- 42.0.    Gen: Well appearing, NAD  CV: RRR, no murmurs  Pulm: CTA bilaterally, no crackles  or wheezes  Ext: trace edema         [1]   Patient Active Problem List  Diagnosis    Tobacco use disorder    Family history of diabetes mellitus (DM)    Lumbar radiculopathy    Rheumatoid arthritis involving multiple sites with positive rheumatoid factor       COPD (chronic obstructive pulmonary disease)       Diabetes mellitus, type 2       Chronic diastolic heart failure       Subcapital fracture of neck of right femur       Caregiver stress

## 2024-06-30 NOTE — Unmapped (Signed)
 [name]     Reason for visit: Follow up    Questions / Concerns that need to be addressed: Questions concerning increasing dose of ozempic     Screening BP- 104/60 68    HCDM reviewed and updated in Epic:    We are working to make sure all of our patients??? wishes are updated in Epic and part of that is documenting a Environmental health practitioner for each patient  A Health Care Decision Maker is someone you choose who can make health care decisions for you if you are not able - who would you most want to do this for you????  was updated.    HCDM (patient stated preference): Cynthia Ramirez, Cynthia Ramirez - Brother - [phone]    HCDM, back-up (If primary HCDM is unavailable): Cynthia Ramirez, Cynthia Ramirez - Sister - [phone]    Annual Screenings:   SDOH   __________________________________________________________________________________________    SCREENINGS COMPLETED IN FLOWSHEETS      AUDIT       PHQ2       PHQ9          GAD7       COPD Assessment       Falls Risk

## 2024-07-02 MED FILL — HUMIRA PEN CITRATE FREE 40 MG/0.4 ML: SUBCUTANEOUS | 28 days supply | Qty: 4 | Fill #6

## 2024-07-07 NOTE — Congregational Nurse Program (Signed)
  Dept: [phone]   Congregational Nurse Program Note  Date of Encounter: 07/07/2024  Past Medical History: Past Medical History:  Diagnosis Date   COPD (chronic obstructive pulmonary disease) (HCC)    Hypertension     Encounter Details:  Patient unsteady with current can.  Cane with feet offered and provided to patient.

## 2024-07-22 NOTE — Unmapped (Signed)
 [facility] Home Delivery Pharmacy Refill Coordination Note    Specialty Medication(s) to be Shipped:   Inflammatory Disorders: Humira     Other medication(s) to be shipped: No additional medications requested for fill at this time     Cynthia Ramirez, DOB: 1961/06/28  Phone: [phone] (home)       All above HIPAA information was verified with patient.     Was a Nurse, learning disability used for this call? No    Completed refill call assessment today to schedule patient's medication shipment from the [facility] Delivery Pharm[facility]  [phone]).  All relevant notes have been reviewed.     Specialty medication(s) and dose(s) confirmed: Regimen is correct and unchanged.   Changes to medications: [name] reports no changes at this time.  Changes to insurance: No  New side effects reported not previously addressed with a pharmacist or physician: None reported  Questions for the pharmacist: No    Confirmed patient received a Conservation officer, historic buildings and a Surveyor, mining with first shipment. The patient will receive a drug information handout for each medication shipped and additional FDA Medication Guides as required.       DISEASE/MEDICATION-SPECIFIC INFORMATION        For patients on injectable medications: Patient currently has 1 doses left.  Next injection is scheduled for 8.11.25.    SPECIALTY MEDICATION ADHERENCE     Medication Adherence    Patient reported X missed doses in the last month: 0  Specialty Medication: HUMIRA (CF) PEN 40 mg/0.4 mL injection (adalimumab )  Patient is on additional specialty medications: No              Were doses missed due to medication being on hold? No      HUMIRA (CF) PEN 40 mg/0.4 mL injection (adalimumab ): 1 doses of medicine on hand       REFERRAL TO PHARMACIST     Referral to the pharmacist: Not needed      [facility]     Shipping address confirmed in Epic.     Cost and Payment: Patient has a $0 copay, payment information is not required.    Delivery Scheduled: Yes, Expected medication delivery date: 8.14.25.     Medication will be delivered via Same Day Courier to the prescription address in Epic WAM.    Doyal Hurst   [facility] and Home Delivery Pharm[facility]  Specialty Technician

## 2024-07-28 NOTE — Unmapped (Signed)
 Patient presented in clinic for COVID Booster Pfizer Bivalent. Patient tolerated well. Patient informed to follow up with PCP.

## 2024-07-28 NOTE — Unmapped (Addendum)
 1st attempt , sent patient a text message and My chart message to schedule visit with PCP in September.   mc - 08/12         ----- Message from Sallyann Sima, MD sent at 07/28/2024  9:35 AM EDT -----  Regarding: Help with an appointment  I need to see her in September.   Can someone help navigate the blocks and help her schedule.

## 2024-07-30 MED FILL — HUMIRA PEN CITRATE FREE 40 MG/0.4 ML: SUBCUTANEOUS | 28 days supply | Qty: 4 | Fill #7

## 2024-08-10 NOTE — Unmapped (Signed)
 I saw and evaluated the patient, participating in the key portions of the service.  I reviewed the resident???s note.  I agree with the resident's findings and plan.   Cynthia Baumgartner, DO       RHEUMATOLOGY OUTPATIENT FOLLOWUP VISIT    Assessment/Plan:  Cynthia Ramirez is a 63 y.o. female here for followup of high titer CCP and RF + RA taking methotrexate  and Humira  every 7 days with better control of RA with this combination although presents today with worsening R knee pain/edema.     Assessment/Plan:    Assessment & Plan  Rheumatoid arthritis with right knee, right wrist, right hand, and right ankle involvement with severe right knee pain and functional limitation  Chronic rheumatoid arthritis with significant involvement of the right knee, right wrist, right hand, and right ankle. Severe pain and functional limitation in the right knee, worsening over the past week. Current treatment with Humira  and methotrexate . Discussed possible antibody development against Humira  though RA seems to be well controlled in other regions outside of R knee. XR of bilateral knees 09/2023 shows cartilage loss and joint space narrowing likely contributing to her pain and limited mobility. Will perform drug monitoring labs for MTX today and administer R knee steroid injection. If this does not provide symptomatic relief or worsening symptoms in other joints, will check humira  levels.   - Administer steroid injection in the right knee   - Order blood work for liver function, CBC  - Monitor response to steroid injection; if no improvement, consider checking Humira  levels  - Discuss potential need for orthopedic referral for knee replacement if symptoms persist  - Hand x-rays    Suspected right carpal tunnel syndrome  Intermittent numbness and tingling in the right hand, primarily at night, suggestive of carpal tunnel syndrome. Symptoms are more pronounced in the right hand despite more severe x-ray findings in the left hand. Possible nerve impingement due to rheumatoid arthritis.  - Recommend use of over-the-counter carpal tunnel brace at night to keep the wrist straight.  - If symptoms persist, consider nerve conduction studies to confirm carpal tunnel syndrome.        Immunization History   Administered Date(s) Administered Comments    COVID-19 VAC,BIVALENT(30YR UP),PFIZER 10/10/2021      04/11/2022     COVID-19 VACC,MRNA,(PFIZER)(PF) 03/11/2020 Per [facility]     04/01/2020 Per [facility]     09/22/2020     Covid-19 Vac, (22yr+) (Comirnaty) Mrna Pfizer  09/19/2023      07/28/2024     Covid-19 Vac, (32yr+) (Spikevax) Monovalent Moderna 10/23/2022     INFLUENZA VACCINE IIV3(IM)(PF)6 MOS UP 09/19/2023     Influenza Vaccine Quad(IM)6 MO-Adult(PF) 09/11/2018      09/05/2019      09/29/2020      10/10/2021      10/03/2022     Influenza Virus Vaccine, unspecified formulation 03/11/2017      09/16/2019 [facility]    PNEUMOCOCCAL POLYSACCHARIDE 23-VALENT 09/29/2020     Pneumococcal Conjugate 13-Valent 05/17/2016     RSV VACCINE, BIVALENT(PF)(ABRYSVO) 11/14/2022     SHINGRIX-ZOSTER VACCINE (HZV),RECOMBINANT,ADJUVANTED(IM) 06/30/2024     TdaP 09/20/2008 Adminis     01/16/2024 Adminis     PROCEDURE NOTE: After a time out and discussion of risks and benefits, including the option for continued conservative management, the patient gave verbal consent for a  right knee injection. Risks discussed included infection (1:5000), pain, bleeding and damage to local structures. A time out was completed confirming patient identification, consent,  and location of procedure. The  right knee was identified and marked.  This area was prepped with betadine and ethyl chloride was used for topical anesthesia. 2 mL of 1% lidocaine  along with 80 mg of depomedrol was injected into this site.  The patient tolerated the procedure well and there were no immediate complications. Patient advised on post-injection flare management and iatrogenic septic joint signs/symptoms requiring immediate medical attention.      HPI: Cynthia Ramirez is a 63 y.o.  female whom I met via video in March 2021 for a new patient encounter for the evaluation of erosive rheumatoid arthritis RF 130, CCP >250 . Previously followed at [facility] and last seen there in 2018. Previous treatment with methotrexate  though she admits not taking it very often. She was not interested in biologics but they were discussed. She does not like to take medications. Her symptoms started after she took a bad fall on the job in 2016. She jammed her left hand. No fractures. She spent a year seeing physicians about the knee injury and eventually had surgery for a torn meniscus. She limped around for a year. She developed sciatica and worked with a Land which helped. She is wary of taking medications and given the fact that she has no erosions, we decided at last visit to try HCQ 400 mg daily. CRP was 36, ESR normal. Tried arava  but it did not help. Xray of the left knee shows tricompartmental joint space narrowing osteophyte formation most proximal in the medial and lateral compartments. Moderate size joint effusion without layering fat fluid. Soft tissues are within normal limits.    Interim History: Cynthia Ramirez is a 63 year old female with rheumatoid arthritis who presents for followup. She is taking Humira  weekly.  History of Present Illness  She presents with worsening right knee pain. Over the past week, she has experienced significant worsening of right knee pain, describing it as 'unbearable.' Despite being on Humira  and methotrexate  for two to three years, her knee pain has intensified, impacting her mobility and requiring the use of a cane and sometimes a walker. The pain is exacerbated by movement, particularly at night when she struggles to find a comfortable position.    In addition to her knee, she experiences pain in her right ankle, wrist, and fingers, with swelling noted in the right ankle. She occasionally takes Lasix  for swelling, depending on her weight. She has a history of trigger finger. She reports numbness and aching in her hands, primarily the right, especially at night. No neck pain. No recent fevers but difficulty sleeping due to heat intolerance. She has been using a non-custom wrist brace purchased online for support. She is frustrated with the persistent right-sided symptoms despite treatment.                MRI of the abdomen:   1.1 cm indeterminate left renal lesion, as above, incompletely characterized on single phase CT and new compared with 2018. Recommend short interval follow-up with MRI of the abdomen with and without contrast to evaluate for interval stability and characterization.      Subacute/chronic splenic infarct, new compared with 02/11/2017.      Hepatic lesions consistent with cysts/biliary versus biliary hamartomas, not substantially changed compared with 2018.     Allergies:  Doxycycline  hcl, Shellfish containing products, and Sulfa  (sulfonamide antibiotics)    Problem List  Patient Active Problem List   Diagnosis    Tobacco use disorder    Family history  of diabetes mellitus (DM)    Lumbar radiculopathy    Rheumatoid arthritis involving multiple sites with positive rheumatoid factor        COPD (chronic obstructive pulmonary disease)        Diabetes mellitus, type 2        Chronic diastolic heart failure        Subcapital fracture of neck of right femur        Caregiver stress      Medical History:  Past Medical History:   Diagnosis Date    Abnormal mammogram     At risk for falls     Cataract last eye appointment showed lite catact    Cognitive impairment     has a hard time remembering    COPD (chronic obstructive pulmonary disease)         COPD (chronic obstructive pulmonary disease)     03/06/2018    Diabetes mellitus         Dizziness     Emphysema of lung     see [facility] chart for info    see [place] chart for info    Essential hypertension, benign     Family history of diabetes mellitus (DM)     Financial difficulties     Hearing impairment     has a hole in her eardrum    HL (hearing loss)     Hyperlipidemia     in my chart    Impaired mobility     Lack of access to transportation     Nonadherence to medication     Obesity yes over weight    Problem with transportation     Rheumatoid arthritis     2017    Marked elevation of rheumatoid factor 130.5  and CCP>250    Tobacco use disorder     Visual impairment     hard to see per patient     Surgical History:  Past Surgical History:   Procedure Laterality Date    BREAST BIOPSY Left 02/2016    HYSTERECTOMY      TAH before age 67    INNER EAR SURGERY Bilateral     2004 and earlier -- unspecified nature of surgery    PILONIDAL CYST DRAINAGE      PR COLONOSCOPY FLX DX W/COLLJ SPEC WHEN PFRMD N/A 03/13/2016    Procedure: COLONOSCOPY, FLEXIBLE, PROXIMAL TO SPLENIC FLEXURE; DIAGNOSTIC, W/WO COLLECTION SPECIMEN BY BRUSH OR WASH;  Surgeon: Alphonsa Lav, MD;  Location: [facility] [place];  Service: Gastroenterology    PR COLSC FLX W/RMVL OF TUMOR POLYP LESION SNARE TQ N/A 04/30/2024    Procedure: COLONOSCOPY FLEX; W/REMOV TUMOR/LES BY SNARE;  Surgeon: Georgine Arlean Min, MD;  Location: [facility];  Service: Gastroenterology    PR FEMORAL FX, OPEN TX Right 07/19/2022    Procedure: HEMI OPEN TREATMENT OF FEMORAL FRACTURE, PROXIMAL END, NECK, INTERNAL FIXATION OR PROSTHETIC REPLACEMENT - modifier 22;  Surgeon: Fonda Rosalynn Cooler, MD;  Location: MAIN OR [facility];  Service: Orthopedics    right hip surgery Right 07/19/2022    TONSILLECTOMY Bilateral 1973 approx     Social History:  Social History     Tobacco Use    Smoking status: Former     Current packs/day: 0.00     Average packs/day: 1 pack/day for 40.0 years (40.0 ttl pk-yrs)     Types: Cigarettes     Start date: 01/19/1982     Quit date:  01/19/2022     Years since quitting: 2.5     Passive exposure: Past    Smokeless tobacco: Never    Tobacco comments:     Pt stated stopped since 01/21/22   Vaping Use Vaping status: Never Used   Substance Use Topics    Alcohol use: Not Currently    Drug use: No   she works at [employer] in [place]     Family History:  Family History   Problem Relation Age of Onset    Cancer Mother     Colon cancer Mother     Heart disease Father     Heart disease Sister     No Known Problems Sister     Diabetes Brother     Diabetes Brother     Diabetes Brother     No Known Problems Daughter     No Known Problems Son     No Known Problems Maternal Grandmother     No Known Problems Maternal Grandfather     No Known Problems Paternal Grandmother     No Known Problems Paternal Grandfather     No Known Problems Other     Breast cancer Neg Hx     Melanoma Neg Hx     Basal cell carcinoma Neg Hx     Squamous cell carcinoma Neg Hx     BRCA 1/2 Neg Hx     Endometrial cancer Neg Hx     Ovarian cancer Neg Hx      Review of Systems: Please see above in the HPI, the remainder of a 10-system review was unremarkable.    Objective   There were no vitals filed for this visit.      Physical Exam  General:   In no distress.    Eyes:   Conjunctivae are clear.   ENT:   MMM.    Skin:    No diffuse rash. There is a firm, mobile nodule under the L cheek.   Musculo Skeletal:   No synovitis in the MCPs, PIPs or wrists. Edema of R third MCP.  Full fist bilaterally.   FROM of both elbows. TTP over the left lateral epicondyle  Crepitus in both knees. Limited flexion to ~90 degrees of R knee.

## 2024-08-11 ENCOUNTER — Ambulatory Visit: Admit: 2024-08-11 | Discharge: 2024-08-11 | Payer: MEDICARE

## 2024-08-11 ENCOUNTER — Encounter: Admit: 2024-08-11 | Discharge: 2024-08-11 | Payer: MEDICARE | Attending: Internal Medicine | Primary: Internal Medicine

## 2024-08-11 ENCOUNTER — Ambulatory Visit: Admit: 2024-08-11 | Discharge: 2024-08-11 | Payer: MEDICARE | Attending: Internal Medicine | Primary: Internal Medicine

## 2024-08-11 ENCOUNTER — Inpatient Hospital Stay: Admit: 2024-08-11 | Discharge: 2024-08-11 | Payer: MEDICARE

## 2024-08-11 DIAGNOSIS — M0579 Rheumatoid arthritis with rheumatoid factor of multiple sites without organ or systems involvement: Principal | ICD-10-CM

## 2024-08-11 DIAGNOSIS — M25561 Pain in right knee: Principal | ICD-10-CM

## 2024-08-11 DIAGNOSIS — M25461 Effusion, right knee: Principal | ICD-10-CM

## 2024-08-11 LAB — CBC W/ AUTO DIFF
BASOPHILS ABSOLUTE COUNT: 0.1 10*9/L (ref 0.0–0.1)
BASOPHILS RELATIVE PERCENT: 0.4 %
EOSINOPHILS ABSOLUTE COUNT: 0.2 10*9/L (ref 0.0–0.5)
EOSINOPHILS RELATIVE PERCENT: 2 %
HEMATOCRIT: 35 % (ref 34.0–44.0)
HEMOGLOBIN: 11.7 g/dL (ref 11.3–14.9)
LYMPHOCYTES ABSOLUTE COUNT: 1.8 10*9/L (ref 1.1–3.6)
LYMPHOCYTES RELATIVE PERCENT: 14.9 %
MEAN CORPUSCULAR HEMOGLOBIN CONC: 33.4 g/dL (ref 32.0–36.0)
MEAN CORPUSCULAR HEMOGLOBIN: 31.6 pg (ref 25.9–32.4)
MEAN CORPUSCULAR VOLUME: 94.5 fL (ref 77.6–95.7)
MEAN PLATELET VOLUME: 8.7 fL (ref 6.8–10.7)
MONOCYTES ABSOLUTE COUNT: 1 10*9/L — ABNORMAL HIGH (ref 0.3–0.8)
MONOCYTES RELATIVE PERCENT: 8.1 %
NEUTROPHILS ABSOLUTE COUNT: 9.2 10*9/L — ABNORMAL HIGH (ref 1.8–7.8)
NEUTROPHILS RELATIVE PERCENT: 74.6 %
PLATELET COUNT: 277 10*9/L (ref 150–450)
RED BLOOD CELL COUNT: 3.7 10*12/L — ABNORMAL LOW (ref 3.95–5.13)
RED CELL DISTRIBUTION WIDTH: 16.6 % — ABNORMAL HIGH (ref 12.2–15.2)
WBC ADJUSTED: 12.3 10*9/L — ABNORMAL HIGH (ref 3.6–11.2)

## 2024-08-11 LAB — HEPATIC FUNCTION PANEL
ALBUMIN: 3.6 g/dL (ref 3.4–5.0)
ALKALINE PHOSPHATASE: 135 U/L — ABNORMAL HIGH (ref 46–116)
ALT (SGPT): 11 U/L (ref 10–49)
AST (SGOT): 18 U/L (ref <=34)
BILIRUBIN DIRECT: 0.2 mg/dL (ref 0.00–0.30)
BILIRUBIN TOTAL: 0.8 mg/dL (ref 0.3–1.2)
PROTEIN TOTAL: 7.5 g/dL (ref 5.7–8.2)

## 2024-08-11 LAB — C-REACTIVE PROTEIN: C-REACTIVE PROTEIN: 12.8 mg/L — ABNORMAL HIGH (ref <=10.0)

## 2024-08-11 LAB — SEDIMENTATION RATE: ERYTHROCYTE SEDIMENTATION RATE: 76 mm/h — ABNORMAL HIGH (ref 0–30)

## 2024-08-11 MED ADMIN — methylPREDNISolone acetate (DEPO-Medrol) injection 80 mg: 80 mg | INTRA_ARTICULAR | @ 14:00:00 | Stop: 2024-08-11

## 2024-08-20 NOTE — Unmapped (Signed)
 [facility] Specialty and Home Delivery Pharmacy Refill Coordination Note    Specialty Medication(s) to be Shipped:   Inflammatory Disorders: Humira     Other medication(s) to be shipped: No additional medications requested for fill at this time    Specialty Medications not needed at this time: N/A     Cynthia Ramirez, DOB: 1961/07/22  Phone: [phone] (home)       All above HIPAA information was verified with patient.     Was a Nurse, learning disability used for this call? No    Completed refill call assessment today to schedule patient's medication shipment from the [facility] and Home Delivery Pharmacy  [phone]).  All relevant notes have been reviewed.     Specialty medication(s) and dose(s) confirmed: Regimen is correct and unchanged.   Changes to medications: Cynthia Ramirez reports no changes at this time.  Changes to insurance: No  New side effects reported not previously addressed with a pharmacist or physician: None reported  Questions for the pharmacist: No    Confirmed patient received a Conservation officer, historic buildings and a Surveyor, mining with first shipment. The patient will receive a drug information handout for each medication shipped and additional FDA Medication Guides as required.       DISEASE/MEDICATION-SPECIFIC INFORMATION        For patients on injectable medications: Next injection is scheduled for 09/08,09/15.    SPECIALTY MEDICATION ADHERENCE     Medication Adherence    Patient reported X missed doses in the last month: 0  Specialty Medication: Humira  40mg /0.30ml  Patient is on additional specialty medications: No  Patient is on more than two specialty medications: No  Any gaps in refill history greater than 2 weeks in the last 3 months: no  Demonstrates understanding of importance of adherence: yes  Informant: patient  Reliability of informant: reliable  Provider-estimated medication adherence level: good  Patient is at risk for Non-Adherence: No  Reasons for non-adherence: no problems identified  Confirmed plan for next specialty medication refill: delivery by pharmacy  Refills needed for supportive medications: not needed          Refill Coordination    Has the Patients' Contact Information Changed: No  Is the Shipping Address Different: No         Were doses missed due to medication being on hold? No    Humira  40/0.4 mg/ml: 14 days of medicine on hand       REFERRAL TO PHARMACIST     Referral to the pharmacist: Not needed      [facility]     Shipping address confirmed in Epic.     Cost and Payment: Patient has a $0 copay, payment information is not required.    Delivery Scheduled: Yes, Expected medication delivery date: 09/10.     Medication will be delivered via Same Day Courier to the prescription address in Epic WAM.    Cynthia Ramirez   [facility] Specialty and Home Delivery Pharmacy  Specialty Technician

## 2024-08-26 MED FILL — HUMIRA PEN CITRATE FREE 40 MG/0.4 ML: SUBCUTANEOUS | 28 days supply | Qty: 4 | Fill #8

## 2024-09-08 ENCOUNTER — Encounter: Admit: 2024-09-08 | Discharge: 2024-09-08 | Payer: MEDICARE

## 2024-09-08 DIAGNOSIS — E119 Type 2 diabetes mellitus without complications: Principal | ICD-10-CM

## 2024-09-08 DIAGNOSIS — M0579 Rheumatoid arthritis with rheumatoid factor of multiple sites without organ or systems involvement: Principal | ICD-10-CM

## 2024-09-08 DIAGNOSIS — Z636 Dependent relative needing care at home: Principal | ICD-10-CM

## 2024-09-08 DIAGNOSIS — I5032 Chronic diastolic (congestive) heart failure: Principal | ICD-10-CM

## 2024-09-08 DIAGNOSIS — J449 Chronic obstructive pulmonary disease, unspecified: Principal | ICD-10-CM

## 2024-09-08 LAB — URINALYSIS WITH MICROSCOPY WITH CULTURE REFLEX PERFORMABLE
BACTERIA: NONE SEEN /HPF
BILIRUBIN UA: NEGATIVE
BLOOD UA: NEGATIVE
GLUCOSE UA: NEGATIVE
KETONES UA: NEGATIVE
NITRITE UA: NEGATIVE
PH UA: 7 (ref 5.0–9.0)
PROTEIN UA: NEGATIVE
RBC UA: 1 /HPF (ref <=4)
SPECIFIC GRAVITY UA: 1.008 (ref 1.003–1.030)
SQUAMOUS EPITHELIAL: 1 /HPF (ref 0–5)
UROBILINOGEN UA: <2
WBC UA: 5 /HPF (ref 0–5)

## 2024-09-08 MED ORDER — FOLIC ACID 1 MG TABLET
ORAL_TABLET | Freq: Every day | ORAL | 3 refills | 90.00000 days | Status: CP
Start: 2024-09-08 — End: ?

## 2024-09-08 NOTE — Unmapped (Addendum)
 PACE program  [facility] - All-inclusive care for the elderly

## 2024-09-08 NOTE — Unmapped (Signed)
 [place] [facility] at [facility]   Influenza Vaccine low dose  Reason for visit: Follow up    Questions / Concerns that need to be addressed:     Screening BP 114/80 P 95    Omron BPs (complete if screening BP has a systolic  > 130 or diastolic > 80)  BP#1    BP#2   BP#3     Average BP   (please note this as a comment in vitals)     PTHomeBP           Dexcom or Libre CGM in use? If so, pull appropriate reporting through portal (Dexcom) or EPIC order [place]).    HCDM reviewed and updated in Epic:    We are working to make sure all of our patients??? wishes are updated in Epic and part of that is documenting a Environmental health practitioner for each patient  A Health Care Decision Maker is someone you choose who can make health care decisions for you if you are not able - who would you most want to do this for you????  was updated.    HCDM (patient stated preference): Marvelous, [place] - Brother - [phone]    HCDM, back-up (If primary HCDM is unavailable): Volante,Virgina - Sister - [phone]    BPAs completed:  Influenza vaccine    Annual Screenings:   Tobacco, Audit/Alcohol , and Health Literacy  __________________________________________________________________________________________    SCREENINGS COMPLETED IN FLOWSHEETS      AUDIT       PHQ2       PHQ9          GAD7       COPD Assessment       Falls Risk

## 2024-09-08 NOTE — Unmapped (Signed)
 [facility] Internal Medicine (Faculty Practice) Clinic Visit    Reason for Visit: Follow up COPD    A/P:  Assessment & Plan  Chronic obstructive pulmonary disease (COPD)  Stable. Not needing oxygen today.   No exacerbations. Transitioning to a new Medicare plan which will cover Trelegy instead of Spiriva .  - Switch Spiriva  to Trelegy once Medicare plan is active.  - Send prescription for Trelegy upon notification from patient.    Type 2 diabetes mellitus  Diabetes is well-controlled with current medication regimen.   On Ozempic  1 mg with weight loss and decreased appetite without nausea.  - Continue Ozempic  1 mg.  - Monitor weight and appetite.    Rheumatoid arthritis with progression  Recent progression noted on x-rays.   Steroid injection in the right knee provided temporary relief.   Inflammation markers are elevated. Current treatment includes Humira  and methotrexate .  - Continue Humira  and methotrexate .  - Monitor inflammation markers with Dr. Roylene    Chronic kidney disease stage 3  Stable. Regular monitoring is ongoing.  - Continue regular monitoring of kidney function.    Right knee pain Recent steroid injection provided temporary relief. Pain persists but is managed under current treatment plan.    Vaginal symptoms, possible candidiasis    - Perform urinalysis to check for infection.  - If urinalysis is negative for infection, consider treating for yeast infection.    Psychological stress  Significant psychological stress related to caregiving responsibilities. Attending a support group which has been beneficial. Considering PACE program for additional support.  - Continue attending support group.  - Explore PACE program for additional support for caregiving responsibilities.    Return in about 6 months (around 03/08/2025).    __________________________________________________________    HPI:    History of Present Illness  Cynthia Ramirez is a 63 year old female with COPD and diabetes who presents for a routine follow-up.    Psychological stress  - Significant stress present with caregiving  - Attending a support group for stress management    Right knee pain  - Persistent right knee pain despite previous steroid injection  - Pain limits physical activity    Medication access and coverage  - Concern regarding medication coverage during transition to Medicare, particularly for Humira  and inhalers  - Transitioning pharmacy services due to Medicare switch  - Folic acid  prescription filled for 60 days instead of 90 days    Diabetes mellitus management  - Currently on Ozempic  for diabetes management  - Experiencing slight weight loss without nausea  - Plans to continue Ozempic  at one milligram dose    Chronic obstructive pulmonary disease symptoms  - Shortness of breath with activity  - Symptoms worsened by limited physical activity due to knee pain  - Not using supplemental oxygen    Rheumatoid arthritis and inflammatory symptoms  - History of rheumatoid arthritis with high inflammation levels  - Currently on Humira  and methotrexate     Genitourinary symptoms  - Raw sensation with urination  - Has leftover medication for yeast infection if needed    __________________________________________________________    Problem List:  Problem List[1]    Medications:  Reviewed in EPIC    ROS: remainder of review of systems negative  __________________________________________________________    Physical Exam:   Vital Signs:  Vitals:    09/08/24 1014   BP: 114/80   BP Site: L Arm   BP Position: Sitting   BP Cuff Size: Medium   Pulse: 80  Resp: 18   Temp: 35.8 ??C (96.5 ??F)   TempSrc: Temporal   SpO2: 96%   Weight: (!) 112.1 kg (247 lb 3.2 oz)   Height: 167.6 cm (5' 6)      Wt Readings from Last 3 Encounters:   09/08/24 (!) 112.1 kg (247 lb 3.2 oz)   08/11/24 (!) 113.4 kg (250 lb)   06/30/24 (!) 113.5 kg (250 lb 3.2 oz)     Body mass index is 39.9 kg/m??.  Physical Exam  MEASUREMENTS: Weight- 247.    Gen: Well appearing, NAD  CV: RRR, no murmurs  Pulm: CTA bilaterally  Ext: No edema         [1]   Patient Active Problem List  Diagnosis    Tobacco use disorder    Family history of diabetes mellitus (DM)    Lumbar radiculopathy    Rheumatoid arthritis involving multiple sites with positive rheumatoid factor    (CMS-HCC)    COPD (chronic obstructive pulmonary disease)    (CMS-HCC)    Diabetes mellitus, type 2    (CMS-HCC)    Chronic diastolic heart failure    (CMS-HCC)    Subcapital fracture of neck of right femur    (CMS-HCC)    Caregiver stress

## 2024-09-08 NOTE — Unmapped (Signed)
 Orders:    Urinalysis with Microscopy with Culture Reflex

## 2024-09-09 DIAGNOSIS — M175 Other unilateral secondary osteoarthritis of knee: Principal | ICD-10-CM

## 2024-09-16 DIAGNOSIS — M0579 Rheumatoid arthritis with rheumatoid factor of multiple sites without organ or systems involvement: Principal | ICD-10-CM

## 2024-09-16 NOTE — Unmapped (Signed)
 [facility] Refill Coordination Note    Specialty Medication(s) to be Shipped:   Inflammatory Disorders: Humira     Other medication(s) to be shipped: No additional medications requested for fill at this time    Specialty Medications not needed at this time: N/A     Cynthia Ramirez, DOB: [birth year]  Phone: [phone] (home)       All above HIPAA information was verified with patient.     Was a Nurse, learning disability used for this call? No    Completed refill call assessment today to schedule patient's medication shipment from the [facility]  [phone]).  All relevant notes have been reviewed.     Specialty medication(s) and dose(s) confirmed: Regimen is correct and unchanged.   Changes to medications: [name] reports no changes at this time.  Changes to insurance: No  New side effects reported not previously addressed with a pharmacist or physician: None reported  Questions for the pharmacist: No    Confirmed patient received a Conservation officer, historic buildings and a Surveyor, mining with first shipment. The patient will receive a drug information handout for each medication shipped and additional FDA Medication Guides as required.       DISEASE/MEDICATION-SPECIFIC INFORMATION        For patients on injectable medications: Next injection is scheduled for 10/13.    SPECIALTY MEDICATION ADHERENCE     Medication Adherence    Patient reported X missed doses in the last month: 0  Specialty Medication: HUMIRA (CF) PEN 40 mg/0.4 mL injection  Patient is on additional specialty medications: No              Were doses missed due to medication being on hold? No    humira  cf pen 40mg /0.19ml  : 2 doses of medicine on hand       REFERRAL TO PHARMACIST     Referral to the pharmacist: Not needed      [facility]     Shipping address confirmed in Epic.     Cost and Payment: Patient has a $0 copay, payment information is not required.    Delivery Scheduled: Yes, Expected medication delivery date: 10/12.     Medication will be delivered via Same Day Courier to the prescription address in Epic WAM.    Cynthia Ramirez   [facility]  Specialty Technician

## 2024-09-16 NOTE — Unmapped (Addendum)
 Called patient to let her know I am working on her request. I have sent a message to Cynthia Ramirez to see if this is possible. Patient is aware that she will be notified of plan of care once I hear back from [facility]. She is also aware that with it being since 2023 since she has seen kyle that she may have to be seen again prior to authorization. She stated she was okay with that and verbalized understanding.         ----- Message from [facility] sent at 09/16/2024 11:32 AM EDT -----  Regarding: Ramirez  Hello the PAC has received the following incoming call    Caller: Cynthia Ramirez    If not the patient, relationship to the patient. N/A    Best callback number: [phone]    Describe the reason for the call: Patient referred for Eval R knee / has had CSI inj with short term relief / wanting to talk about gel injection. Last seen Cynthia 2023/11. Patient wants to avoid two separate visits. Please advise.     Was an appointment offered as a placeholder? N/A     Thank you!

## 2024-09-17 MED ORDER — MAGNESIUM OXIDE 400 MG (241.3 MG MAGNESIUM) TABLET
ORAL_TABLET | Freq: Every day | ORAL | 3 refills | 90.00000 days | Status: CP
Start: 2024-09-17 — End: 2025-09-12

## 2024-09-17 MED ORDER — LOSARTAN 50 MG-HYDROCHLOROTHIAZIDE 12.5 MG TABLET
ORAL_TABLET | Freq: Every day | ORAL | 3 refills | 90.00000 days | Status: CP
Start: 2024-09-17 — End: ?

## 2024-09-17 MED ORDER — OZEMPIC 1 MG/DOSE (4 MG/3 ML) SUBCUTANEOUS PEN INJECTOR
SUBCUTANEOUS | 11 refills | 28.00000 days | Status: CP
Start: 2024-09-17 — End: ?

## 2024-09-17 MED ORDER — OMEPRAZOLE 20 MG CAPSULE,DELAYED RELEASE
ORAL_CAPSULE | Freq: Every day | ORAL | 3 refills | 90.00000 days | Status: CP
Start: 2024-09-17 — End: 2025-09-17

## 2024-09-17 MED ORDER — ATORVASTATIN 40 MG TABLET
ORAL_TABLET | Freq: Every day | ORAL | 3 refills | 90.00000 days | Status: CP
Start: 2024-09-17 — End: ?

## 2024-09-17 MED ORDER — POTASSIUM CHLORIDE ER 20 MEQ TABLET,EXTENDED RELEASE(PART/CRYST)
ORAL_TABLET | Freq: Every day | ORAL | 3 refills | 90.00000 days | Status: CP
Start: 2024-09-17 — End: ?

## 2024-09-17 MED ORDER — TRELEGY ELLIPTA 100 MCG-62.5 MCG-25 MCG POWDER FOR INHALATION
Freq: Every day | RESPIRATORY_TRACT | 11 refills | 28.00000 days | Status: CP
Start: 2024-09-17 — End: ?

## 2024-09-21 NOTE — Unmapped (Incomplete)
 Cynthia Ramirez Pulmonary Diseases and Critical Care Medicine  Pulmonary Clinic - Follow-up Visit    Referring Physician :  Nat Earnie Baumgartner  PCP:     Colford, Sallyann Pale, MD  Reason for Consult:   Followup COPD      ASSESSMENT and PLAN     Cynthia Ramirez is a 63 y.o. female with obesity, COPD on 1-2L, former tobacco use (1ppd, >30 years, quit in Feb 2023), HFpEF, RA on methotrexate , OSA on BiPAP, knee osteoarthritis whom we are seeing in for followup of COPD.      Doing well on current regimen: Spiriva  and Advair . Rarely uses her rescue inhaler. Has been able to wean off of O2, sats >89% even with exertion at home. Dyspnea also improving. She uses Lasix  PRN for weight gain, Last TTE showed diastolic dysfunction, normal RV.     Plan today:   - followup in 6 months for COPD monitoring  - drop the ICS/LABA, continue just the LAMA (spiriva )  - repeat LDCT in March 2026 (ordered)  - cont good CPAP compliance, CPAP study she needed 2L   - sent in new Rx for CPAP nasal pillows and overnight oxygen  - immunosuppression per rheum, no e/o ILD on Chest CT. Will CTM her PFTs  - UTD flu and COVID    Cynthia Ramirez was discussed with Dr. Charlott who agrees with the assessment and plan above.     RR:Wprnoz Earnie Baumgartner, Colford, Sallyann Pale, MD    Heinz Canny, MD  Pulmonary & Critical Care Fellow    HISTORY:      Active Pulmonary Problems & Brief History:  Cynthia Ramirez is a 63 y.o. female with a history of obesity, COPD, tobacco use (1ppd, >30 years, quit in Feb 2023), OSA, HFpEF, RA on methotrexate /humira , knee osteoarthritis whom we are following for COPD and OSA.     Brief history by problem:  #COPD, FEV1 75% predicted (GOLD2B)   - MMRC 2  - last PFT 04/2023: FEV 75%, DLCO moderately reduced 57%  - last exacerbation - hospitalized 01/20/22-02/07/22 hypoxic respiratory failure 2/2 viral pneumonia and RV failure  - current regiment -  Spiriva  + albuterol  PRN  - labs  - A1AT - negative  - eos - 200 (highest 300 historically)  - IgE - 105  - ANCA - defer for now, low suspicion  - imaging: Feb 2024 stable on repeat Feb 2025  - smoking: 30 + pack years, stopped since discharge in Feb 2023  - lung cancer screening - UTD, next due March 2026  - vaccination (pneumococcal, flu, COVID): UTD flu, COVID, RSV     #HFpEF  - Last TTE 2024: LVEF 55%, Grade I DD.   - on Lasix  PRN     #GERD symptoms  - no issues on PPI (prilosec)     #OSA symptoms:   - currently on CPAP 13 (ResMed)  - no longer getting 2L bleed in because they took away all of her oxygen  - last sleep study rec:  Severe obstructive sleep apnea with an overall AHI = 76.  The respiratory events were associated with oxygen desaturation to a low of 74% and severe fragmentation of sleep architecture.  In the PAP titration portion of the study, CPAP was titrated up to 13 cm H2O in the supine position in REM sleep with improvement in obstructive events.   Hypoxemia. Patient required 2 lpm supplemental oxygen to keep saturations in the low-mid 90's.    #Sinus symptoms  -  well controlled on saline rinse     Interval events Oct 2025::  - off O2 still  - taking Advair  and Spiriva   - no recent steroids or exacerbation (in the last 2 years)  - takes lasix  PRN -- takes once or twice eery 6 months   - working on getting knee injections/possible replacement      OSH records and referral documentation reviewed.     Review of Systems:  A comprehensive review of systems was completed and negative except as noted in HPI.    PMHx:  - COPD  - RA  - HFpEF    PSHx:  Procedure Laterality Date    BREAST BIOPSY Left 02/2016    HYSTERECTOMY         TAH before age 58    INNER EAR SURGERY Bilateral       2004 and earlier -- unspecified nature of surgery    PILONIDAL CYST DRAINAGE        PR COLONOSCOPY FLX DX W/COLLJ SPEC WHEN PFRMD N/A 03/13/2016     Procedure: COLONOSCOPY, FLEXIBLE, PROXIMAL TO SPLENIC FLEXURE; DIAGNOSTIC, W/WO COLLECTION SPECIMEN BY BRUSH OR WASH;  Surgeon: Alphonsa Lav, MD; Location: [facility] PROCED[facility]  Service: Gastroenterology    PR FEMORAL FX, OPEN TX Right 07/19/2022     Procedure: HEMI OPEN TREATMENT OF FEMORAL FRACTURE, PROXIMAL END, NECK, INTERNAL FIXATION OR PROSTHETIC REPLACEMENT - modifier 22;  Surgeon: Fonda Rosalynn Cooler, MD;  Location: MAIN OR [facility];  Service: Orthopedics    right hip surgery Right 07/19/2022    TONSILLECTOMY         FHx:  Reviewed    Social   Smoking (cigarettes, vaping, marijuana): Quit Feb 2023, 30 pack year smoking hx    Relevant Medications:   -advair   -spiriva   -albuterol   -MTX    Allergies: Reviewed.    Other History:  The past medical history, surgical history, social history, family history, medications and allergies were personally reviewed and updated in the patient's electronic medical record. Pertinent items are noted above.       PHYSICAL EXAM:      Vitals:    09/23/24 1217   BP: 110/59   Pulse: 68   Temp: 36.8 ??C (98.2 ??F)   SpO2: 95%         General: Alert, NAD, appears well nourished  Eyes: sclera clear, anicteric, conjunctiva without erythema/injection  HEENT: nares patent  Heart: S1, S2 normal, RRR  Lungs: normal WOB on RA, CTAB, no focal crackles or wheezes, good air movement  Skin: WWP, No rashes, wounds, lesions on clothed exam  Neuro: Alert, oriented, follows commands. Moving all extremities spontaneously  Psych: No agitation, anxiety, or depression    LABORATORY and RADIOLOGY DATA:     Pulmonary Function Tests:  04/2023:  The expiratory flow volume curve shows mild obstructive ventilatory defect.  DLCO is moderately reduced. Inspiratory limb of flow-volume loop demonstrates normal forces inspiratory flows.  Improved since prior test on 08/07/2022      02/2024: Mild obstructive ventilatory defect. Moderate reduction of DLCO.       Pulmonary Function Testing:        Labs:   Eos 300    Pertinent Imaging Data:  Images personally reviewed and discussed with attending     TTE 04/2023:  Summary    1. The left ventricle is mildly dilated in size with normal wall thickness.    2. The left ventricular systolic function is normal, LVEF is  visually  estimated at > 55%.    3. There is grade I diastolic dysfunction (impaired relaxation).    4. Mitral annular calcification is present.    5. The aortic valve is trileaflet with mildly thickened leaflets with normal  excursion.    6. The left atrium is mildly dilated in size.    7. The right ventricle is normal in size, with normal systolic function.    8. There is no evidence of an interatrial flow communication or  intrapulmonary shunt by agitated saline study.    9. IVC size and inspiratory change suggest mildly elevated right atrial  pressure. (5-10 mmHg).    CT (LDCT) 01/2023:  IMPRESSION:  --Middle lobe micronodule.  --Enlarged main pulmonary artery measures up to 3.7 cm, nonspecific, which may be seen in setting of pulmonary hypertension.  --1.8 cm intermediate attenuation right upper pole renal lesion is indeterminate, high attenuation cyst versus renal cortical neoplasm.  --Stable bilateral axillary lymph nodes measuring up to 1.4 cm.  --Mild three-vessel coronary calcifications.  -- Asymmetrically prominent left breast soft tissue with punctate calcification. bilateral axillary lymph nodes measuring up to 1.4 cm.  --Mild three-vessel coronary calcifications.  -- Asymmetrically prominent left breast soft tissue with punctate calcification.

## 2024-09-23 ENCOUNTER — Ambulatory Visit
Admit: 2024-09-23 | Discharge: 2024-09-24 | Payer: MEDICARE | Attending: Student in an Organized Health Care Education/Training Program | Primary: Student in an Organized Health Care Education/Training Program

## 2024-09-23 DIAGNOSIS — J449 Chronic obstructive pulmonary disease, unspecified: Principal | ICD-10-CM

## 2024-09-23 NOTE — Unmapped (Addendum)
 For your breathing --     -- CONTINUE  taking the spiriva   -- STOP taking the advair  twice daily   -- next time I see you we can decrease further if you are doing well  -- message me when you are out of spiriva     I will also order you a new CPAP mask, let me know how it goes.     We will repeat your lung cancer screening test in March 2026.

## 2024-09-27 MED FILL — HUMIRA PEN CITRATE FREE 40 MG/0.4 ML: SUBCUTANEOUS | 28 days supply | Qty: 4 | Fill #9

## 2024-10-01 NOTE — Congregational Nurse Program (Signed)
  Dept: [phone]   Congregational Nurse Program Note  Date of Encounter: 10/01/2024  Past Medical History: Past Medical History:  Diagnosis Date   COPD (chronic obstructive pulmonary disease) (HCC)    Hypertension     Encounter Details:  Community Questionnaire - 10/01/24 1033       Questionnaire   Ask client: Do you give verbal consent for me to treat you today? Yes    Student Assistance N/A    Location Patient Served  [facility]    Encounter Setting CN site    Population Status Unknown    Engineer, building services or [employer]    Insurance/Financial Assistance Referral N/A    Medication N/A    Medical Provider Yes    Screening Referrals Made N/A    Medical Referrals Made N/A    Medical Appointment Completed N/A    CNP Interventions Advocate/Support    Screenings CN Performed Blood Pressure    ED Visit Averted N/A    Life-Saving Intervention Made N/A

## 2024-10-10 NOTE — Addendum Note (Signed)
 Addended by: CHARLOTT KNEE on: 10/10/2024 08:54 PM     Modules accepted: Level of Service

## 2024-10-10 NOTE — Progress Notes (Signed)
I saw and evaluated the patient, participating in the key portions of the service.  I reviewed the Fellow's (subspecialty resident’s) note.  I agree with the Fellow's findings and plan.     Star Cheese G Reyli Schroth, MD

## 2024-10-15 NOTE — Progress Notes (Signed)
 10/15/2024 - Clinical assessment date was due on 11/14/2024. Reached out to [name] and got approval to proceed with scheduling the patients refill and to move the clinical assessment date to match the next refill coordination.    [facility] Refill Coordination Note    Specialty Medication(s) to be Shipped:   Inflammatory Disorders: Humira     Other medication(s) to be shipped: No additional medications requested for fill at this time    Specialty Medications not needed at this time: N/A     Cynthia Ramirez, DOB: 26-Aug-1961  Phone: [phone] (home)       All above HIPAA information was verified with patient.     Was a nurse, learning disability used for this call? No    Completed refill call assessment today to schedule patient's medication shipment from the [facility]  [phone]).  All relevant notes have been reviewed.     Specialty medication(s) and dose(s) confirmed: Regimen is correct and unchanged.   Changes to medications: Brookie reports stopping the following medications: Advair  inhaler  Changes to insurance: No  New side effects reported not previously addressed with a pharmacist or physician: None reported  Questions for the pharmacist: No    Confirmed patient received a Conservation Officer, Historic Buildings and a Surveyor, Mining with first shipment. The patient will receive a drug information handout for each medication shipped and additional FDA Medication Guides as required.       DISEASE/MEDICATION-SPECIFIC INFORMATION        For patients on injectable medications: Next injection is scheduled for 10/19/2024.    SPECIALTY MEDICATION ADHERENCE     Medication Adherence    Patient reported X missed doses in the last month: 0  Specialty Medication: adalimumab : HUMIRA (CF) PEN 40 mg/0.4 mL injection  Patient is on additional specialty medications: No              Were doses missed due to medication being on hold? No     adalimumab : HUMIRA (CF) PEN 40 mg/0.4 mL injection : 1 doses of medicine on hand        REFERRAL TO PHARMACIST     Referral to the pharmacist: Not needed      SHIPPING     Shipping address confirmed in Epic.     Cost and Payment: Patient has a $0 copay, payment information is not required.    Delivery Scheduled: Yes, Expected medication delivery date: 10/22/2024.     Medication will be delivered via Same Day Courier to the prescription address in Epic WAM.     [name]   [facility]  Specialty Technician

## 2024-10-22 MED FILL — HUMIRA PEN CITRATE FREE 40 MG/0.4 ML: SUBCUTANEOUS | 28 days supply | Qty: 4 | Fill #10

## 2024-11-09 NOTE — Progress Notes (Signed)
 [facility] Clinical Assessment & Refill Coordination Note    Cynthia Ramirez, DOB: May 30, 1961  Phone: [phone] (home)     All above HIPAA information was verified with patient.     Was a nurse, learning disability used for this call? No    Specialty Medication(s):   Inflammatory Disorders: Humira      Current Medications[1]     Changes to medications: Keleigh reports no changes at this time.    Medication list has been reviewed and updated in Epic: patient declined review    Allergies[2]    Changes to allergies: No    Allergies have been reviewed and updated in Epic: Yes    SPECIALTY MEDICATION ADHERENCE     Humira  40/0.4 mg/ml: 2 doses of medicine on hand       Medication Adherence    Patient reported X missed doses in the last month: 0  Specialty Medication: Humira   Patient is on additional specialty medications: No  Informant: patient          Specialty medication(s) dose(s) confirmed: Regimen is correct and unchanged.     Are there any concerns with adherence? No    Adherence counseling provided? Not needed    CLINICAL MANAGEMENT AND INTERVENTION      Clinical Benefit Assessment:    Do you feel the medicine is effective or helping your condition? Yes    Clinical Benefit counseling provided? Progress note from 08/11/24 shows evidence of clinical benefit    Adverse Effects Assessment:    Are you experiencing any side effects? No    Are you experiencing difficulty administering your medicine? No    Quality of Life Assessment:    Quality of Life    Rheumatology  1. What impact has your specialty medication had on the reduction of your daily pain level?: Some  2. What impact has your specialty medication had on your ability to complete daily tasks (prepare meals, get dressed, etc...)?: Some  Oncology  Dermatology  Cystic Fibrosis          How many days over the past month did your RA  keep you from your normal activities? For example, brushing your teeth or getting up in the morning. Every day but not as sever as what it was    Have you discussed this with your provider? Not needed    Acute Infection Status:    Acute infections noted within Epic:  No active infections    Patient reported infection: None    Therapy Appropriateness:    Is the medication and dose appropriate considering the patient???s diagnosis, treatment, and disease journey, comorbidities, medical history, current medications, allergies, therapeutic goals, self-administration ability, and access barriers? Yes, therapy is appropriate and should be continued     Clinical Intervention:    Was an intervention completed as part of this clinical assessment? No    DISEASE/MEDICATION-SPECIFIC INFORMATION      For patients on injectable medications: Next injection is scheduled for 11/24 and 12/1.    Chronic Inflammatory Diseases: Have you experienced any flares in the last month? No    PATIENT SPECIFIC NEEDS     Does the patient have any physical, cognitive, or cultural barriers? No    Is the patient high risk? No    Does the patient require physician intervention or other additional services (i.e., nutrition, smoking cessation, social work)? No    Does the patient have an additional or emergency contact listed in their chart? Yes  SOCIAL DETERMINANTS OF HEALTH     At the [facility], we have learned that life circumstances - like trouble affording food, housing, utilities, or transportation can affect the health of many of our patients.   That is why we wanted to ask: are you currently experiencing any life circumstances that are negatively impacting your health and/or quality of life? No    Social Drivers of Health     Food Insecurity: Food Insecurity Present (06/30/2024)    Hunger Vital Sign     Worried About Running Out of Food in the Last Year: Sometimes true     Ran Out of Food in the Last Year: Sometimes true   Tobacco Use: Medium Risk (09/23/2024)    Patient History     Smoking Tobacco Use: Former     Smokeless Tobacco Use: Never     Passive Exposure: Past   Transportation Needs: No Transportation Needs (06/30/2024)    PRAPARE - Therapist, Art (Medical): No     Lack of Transportation (Non-Medical): No   Alcohol Use: Not At Risk (09/08/2024)    Alcohol Use     How often do you have a drink containing alcohol?: Never     How many drinks containing alcohol do you have on a typical day when you are drinking?: Not on file     How often do you have 5 or more drinks on one occasion?: Never   Housing: High Risk (06/30/2024)    Housing     Within the past 12 months, have you ever stayed: outside, in a car, in a tent, in an overnight shelter, or temporarily in someone else's home (i.e. couch-surfing)?: Yes     Are you worried about losing your housing?: Yes   Physical Activity: Inactive (09/08/2024)    Exercise Vital Sign     Days of Exercise per Week: 0 days     Minutes of Exercise per Session: 0 min   Utilities: High Risk (06/30/2024)    Utilities     Within the past 12 months, have you been unable to get utilities (heat, electricity) when it was really needed?: Yes   Stress: Stress Concern Present (09/08/2024)    [employer] of Occupational Health - Occupational Stress Questionnaire     Feeling of Stress: To some extent   Interpersonal Safety: Not At Risk (08/11/2024)    Interpersonal Safety     Unsafe Where You Currently Live: No     Physically Hurt by Anyone: No     Abused by Anyone: No   Substance Use: Not on file (06/10/2024)   Intimate Partner Violence: Not At Risk (08/11/2024)    Humiliation, Afraid, Rape, and Kick questionnaire     Fear of Current or Ex-Partner: No     Emotionally Abused: No     Physically Abused: No     Sexually Abused: No   Social Connections: Socially Isolated (06/10/2023)    Social Connection and Isolation Panel     Frequency of Communication with Friends and Family: Never     Frequency of Social Gatherings with Friends and Family: Never     Attends Religious Services: Never     Database Administrator or Organizations: No     Attends Banker Meetings: Never     Marital Status: Living with partner   Physicist, Medical Strain: High Risk (06/10/2023)    Overall Financial Resource Strain (CARDIA)     Difficulty of  Paying Living Expenses: Very hard   Health Literacy: Low Risk (09/08/2024)    Health Literacy     : Never   Internet Connectivity: No Internet connectivity concern identified (09/08/2024)    Internet Connectivity     Do you have access to internet services: Yes     How do you connect to the internet: Personal Device at home     Is your internet connection strong enough for you to watch video on your device without major problems?: Yes     Do you have enough data to get through the month?: Yes     Does at least one of the devices have a camera that you can use for video chat?: Yes       Would you be willing to receive help with any of the needs that you have identified today? No       SHIPPING     Specialty Medication(s) to be Shipped:   Inflammatory Disorders: Humira     Other medication(s) to be shipped: No additional medications requested for fill at this time    Specialty Medications not needed at this time: N/A     Changes to insurance: No    Cost and Payment: Patient has a $0 copay, payment information is not required.    Delivery Scheduled: Yes, Expected medication delivery date: 12/3.     Medication will be delivered via Same Day Courier to the confirmed prescription address in [facility].    The patient will receive a drug information handout for each medication shipped and additional FDA Medication Guides as required.  Verified that patient has previously received a Conservation Officer, Historic Buildings and a Surveyor, Mining.    The patient or caregiver noted above participated in the development of this care plan and knows that they can request review of or adjustments to the care plan at any time.      All of the patient's questions and concerns have been addressed.    Rosalynn GORMAN Kin, PharmD   [facility] Specialty Pharmacist         [1]   Current Outpatient Medications   Medication Sig Dispense Refill    acetaminophen  (TYLENOL ) 325 MG tablet Take 2 tablets (650 mg total) by mouth two (2) times a day as needed.      albuterol  HFA 90 mcg/actuation inhaler Inhale 2 puffs every six (6) hours as needed for wheezing.      atorvastatin  (LIPITOR) 40 MG tablet Take 1 tablet (40 mg total) by mouth daily. 90 tablet 3    baclofen  (LIORESAL ) 10 MG tablet Take 1 tablet (10 mg total) by mouth Three (3) times a day. 30 tablet 1    cholecalciferol, vitamin D3-50 mcg, 2,000 unit,, 50 mcg (2,000 unit) tablet Take 1 tablet (50 mcg total) by mouth daily. 2 tablets daily      fluticasone -umeclidin-vilanter (TRELEGY ELLIPTA ) 100-62.5-25 mcg inhaler Inhale 1 puff daily. 28 each 11    folic acid  (FOLVITE ) 1 MG tablet Take 1 tablet (1,000 mcg total) by mouth daily. 90 tablet 3    furosemide  (LASIX ) 40 MG tablet Take 1 tablet (40 mg total) by mouth daily. Take one tablet daily as needed for weight gain (goal weight ~248-251lbs)  0    HUMIRA  PEN CITRATE FREE 40 MG/0.4 ML Inject 0.4 mL (40 mg total) under the skin every seven (7) days. 4 each 11    inhalational spacing device (AEROCHAMBER MV) Spcr 1 each by  Miscellaneous route daily. 1 each 0    losartan -hydroCHLOROthiazide  (HYZAAR ) 50-12.5 mg per tablet Take 1 tablet by mouth daily. 90 tablet 3    magnesium  oxide (MAG-OX) 400 mg (241.3 mg elemental magnesium ) tablet Take 1 tablet by mouth daily. 90 tablet 3    methotrexate  2.5 MG tablet Take 8 tablets (20 mg total) by mouth once a week. 96 tablet 3    multivitamin (TAB-A-VITE/THERAGRAN) per tablet Take 1 tablet by mouth daily.      ofloxacin  (FLOXIN ) 0.3 % otic solution Instill 10 drops into the affected ear(s) once daily for 7 days. 10 mL 0    omeprazole  (PRILOSEC) 20 MG capsule Take 1 capsule (20 mg total) by mouth daily. 90 capsule 3    potassium chloride  20 MEQ ER tablet Take 1 tablet (20 mEq total) by mouth daily. 90 tablet 3    semaglutide  (OZEMPIC ) 1 mg/dose (4 mg/3 mL) PnIj injection Inject 1 mg under the skin every seven (7) days. 3 mL 11    vit C/E/Zn/coppr/lutein/zeaxan (PRESERVISION AREDS 2 ORAL) Take by mouth two (2) times a day.       No current facility-administered medications for this visit.   [2]   Allergies  Allergen Reactions    Doxycycline  Hcl Hives    Shellfish Containing Products Swelling and Other (See Comments)     Swelling of lips, no swelling of tongue or difficulty breathing.    Swelling of lips, no swelling of tongue or difficulty breathing.UnknownUnknownSwelling of lips, no swelling of tongue or difficulty breathing. Unknown    Swelling of lips, no swelling of tongue or difficulty breathing.   Unknown   Unknown   Swelling of lips, no swelling of tongue or difficulty breathing.    Sulfa  (Sulfonamide Antibiotics) Other (See Comments) and Nausea Only     Stomach issue

## 2024-11-18 DIAGNOSIS — K219 Gastro-esophageal reflux disease without esophagitis: Principal | ICD-10-CM

## 2024-11-18 DIAGNOSIS — E119 Type 2 diabetes mellitus without complications: Principal | ICD-10-CM

## 2024-11-18 MED ORDER — ATORVASTATIN 40 MG TABLET
ORAL_TABLET | Freq: Every day | ORAL | 3 refills | 90.00000 days | Status: CP
Start: 2024-11-18 — End: ?

## 2024-11-18 MED ORDER — POTASSIUM CHLORIDE ER 20 MEQ TABLET,EXTENDED RELEASE(PART/CRYST)
ORAL_TABLET | Freq: Every day | ORAL | 3 refills | 90.00000 days | Status: CP
Start: 2024-11-18 — End: ?

## 2024-11-18 MED ORDER — OMEPRAZOLE 20 MG CAPSULE,DELAYED RELEASE
ORAL_CAPSULE | Freq: Every day | ORAL | 3 refills | 90.00000 days | Status: CP
Start: 2024-11-18 — End: 2025-11-18

## 2024-11-18 MED ORDER — MAGNESIUM OXIDE 400 MG (241.3 MG MAGNESIUM) TABLET
ORAL_TABLET | Freq: Every day | ORAL | 3 refills | 90.00000 days | Status: CP
Start: 2024-11-18 — End: 2025-11-13

## 2024-11-18 MED ORDER — TRELEGY ELLIPTA 100 MCG-62.5 MCG-25 MCG POWDER FOR INHALATION
Freq: Every day | RESPIRATORY_TRACT | 11 refills | 28.00000 days | Status: CP
Start: 2024-11-18 — End: ?

## 2024-11-18 MED ORDER — LOSARTAN 50 MG-HYDROCHLOROTHIAZIDE 12.5 MG TABLET
ORAL_TABLET | Freq: Every day | ORAL | 3 refills | 90.00000 days | Status: CP
Start: 2024-11-18 — End: ?

## 2024-11-18 MED ORDER — OZEMPIC 1 MG/DOSE (4 MG/3 ML) SUBCUTANEOUS PEN INJECTOR
SUBCUTANEOUS | 11 refills | 28.00000 days | Status: CP
Start: 2024-11-18 — End: ?

## 2024-11-18 MED FILL — HUMIRA PEN CITRATE FREE 40 MG/0.4 ML: SUBCUTANEOUS | 28 days supply | Qty: 4 | Fill #11

## 2024-11-26 MED ORDER — TIOTROPIUM BROMIDE 18 MCG CAPSULE WITH INHALATION DEVICE
ORAL_CAPSULE | Freq: Every day | RESPIRATORY_TRACT | 11 refills | 30.00000 days | Status: CP
Start: 2024-11-26 — End: 2025-11-26

## 2024-11-26 MED ORDER — OMEPRAZOLE 20 MG CAPSULE,DELAYED RELEASE
ORAL_CAPSULE | Freq: Every day | ORAL | 3 refills | 90.00000 days | Status: CP
Start: 2024-11-26 — End: 2025-11-26

## 2024-11-26 MED ORDER — OZEMPIC 1 MG/DOSE (4 MG/3 ML) SUBCUTANEOUS PEN INJECTOR
SUBCUTANEOUS | 11 refills | 28.00000 days | Status: CP
Start: 2024-11-26 — End: ?

## 2024-11-26 MED ORDER — LOSARTAN 50 MG-HYDROCHLOROTHIAZIDE 12.5 MG TABLET
ORAL_TABLET | Freq: Every day | ORAL | 3 refills | 90.00000 days | Status: CP
Start: 2024-11-26 — End: ?

## 2024-11-26 MED ORDER — ATORVASTATIN 40 MG TABLET
ORAL_TABLET | Freq: Every day | ORAL | 3 refills | 90.00000 days | Status: CP
Start: 2024-11-26 — End: ?

## 2024-11-26 NOTE — Addendum Note (Signed)
 Addended by: Nieve Rojero on: 11/26/2024 09:56 AM     Modules accepted: Orders

## 2024-12-02 DIAGNOSIS — J449 Chronic obstructive pulmonary disease, unspecified: Principal | ICD-10-CM

## 2024-12-02 MED ORDER — ADVAIR HFA 230 MCG-21 MCG/ACTUATION AEROSOL INHALER
3 refills | 0.00000 days
Start: 2024-12-02 — End: ?

## 2024-12-03 MED ORDER — ADVAIR HFA 230 MCG-21 MCG/ACTUATION AEROSOL INHALER
3 refills | 0.00000 days
Start: 2024-12-03 — End: ?

## 2024-12-07 DIAGNOSIS — M0579 Rheumatoid arthritis with rheumatoid factor of multiple sites without organ or systems involvement: Principal | ICD-10-CM

## 2024-12-07 MED ORDER — HUMIRA PEN CITRATE FREE 40 MG/0.4 ML
SUBCUTANEOUS | 11 refills | 28.00000 days | Status: CP
Start: 2024-12-07 — End: ?
  Filled 2024-12-14: qty 4, 28d supply, fill #0

## 2024-12-07 NOTE — Progress Notes (Signed)
 [facility] Refill Coordination Note    Specialty Medication(s) to be Shipped:   Inflammatory Disorders: Humira     Other medication(s) to be shipped: No additional medications requested for fill at this time    Specialty Medications not needed at this time: N/A     Cynthia Ramirez, DOB: 03/02/1961  Phone: [phone] (home)       All above HIPAA information was verified with patient.     Was a nurse, learning disability used for this call? No    Completed refill call assessment today to schedule patient's medication shipment from the [facility] Delivery Pharmacy  [phone]).  All relevant notes have been reviewed.     Specialty medication(s) and dose(s) confirmed: Regimen is correct and unchanged.   Changes to medications: Dmya reports no changes at this time.  Changes to insurance: No  New side effects reported not previously addressed with a pharmacist or physician: None reported  Questions for the pharmacist: No    Confirmed patient received a Conservation Officer, Historic Buildings and a Surveyor, Mining with first shipment. The patient will receive a drug information handout for each medication shipped and additional FDA Medication Guides as required.       DISEASE/MEDICATION-SPECIFIC INFORMATION        N/A    SPECIALTY MEDICATION ADHERENCE     Medication Adherence    Patient reported X missed doses in the last month: 0  Specialty Medication: adalimumab : HUMIRA (CF) PEN 40 mg/0.4 mL injection  Patient is on additional specialty medications: No              Were doses missed due to medication being on hold? No      adalimumab : HUMIRA (CF) PEN 40 mg/0.4 mL injection: 1 doses of medicine on hand       Specialty medication is an injection or given on a cycle: Yes, Next injection is scheduled for 12.29.25.    REFERRAL TO PHARMACIST     Referral to the pharmacist: Not needed      [facility]     Shipping address confirmed in Epic.     Cost and Payment: Patient has a $0 copay, payment information is not required.    Delivery Scheduled: Yes, Expected medication delivery date: 12.30.25. Pt aware refill request sent.    Medication will be delivered via Next Day Courier to the prescription address in Epic WAM.    Doyal Hurst   [facility] Delivery [facility]  Specialty Technician

## 2024-12-07 NOTE — Telephone Encounter (Signed)
 Humira  refill    Last Visit Date: 08/11/2024  Next Visit Date: 02/11/2025    Lab Results   Component Value Date    ALT 11 08/11/2024    AST 18 08/11/2024    ALBUMIN 3.6 08/11/2024    CREATININE 0.78 06/30/2024     Lab Results   Component Value Date    WBC 12.3 (H) 08/11/2024    HGB 11.7 08/11/2024    HCT 35.0 08/11/2024    PLT 277 08/11/2024     Lab Results   Component Value Date    NEUTROPCT 74.6 08/11/2024    LYMPHOPCT 14.9 08/11/2024    MONOPCT 8.1 08/11/2024    EOSPCT 2.0 08/11/2024    BASOPCT 0.4 08/11/2024     No results found for: VITD

## 2025-01-06 DIAGNOSIS — M0579 Rheumatoid arthritis with rheumatoid factor of multiple sites without organ or systems involvement: Principal | ICD-10-CM

## 2025-01-06 DIAGNOSIS — J449 Chronic obstructive pulmonary disease, unspecified: Principal | ICD-10-CM

## 2025-01-06 MED ORDER — SPIRIVA RESPIMAT 2.5 MCG/ACTUATION SOLUTION FOR INHALATION
15 refills | 0.00000 days
Start: 2025-01-06 — End: ?

## 2025-01-06 NOTE — Progress Notes (Signed)
 [facility] Delivery Pharmacy Refill Coordination Note    Specialty Medication(s) to be Shipped:   Inflammatory Disorders: Humira     Other medication(s) to be shipped: No additional medications requested for fill at this time    Specialty Medications not needed at this time: N/A     Cynthia Ramirez, DOB: Dec 03, 1961  Phone: [phone] (home)       All above HIPAA information was verified with patient.     Was a nurse, learning disability used for this call? No    Completed refill call assessment today to schedule patient's medication shipment from the [facility] and Home Delivery Pharmacy  [phone]).  All relevant notes have been reviewed.     Specialty medication(s) and dose(s) confirmed: Regimen is correct and unchanged.   Changes to medications: Cynthia Ramirez reports no changes at this time.  Changes to insurance: Yes: new plan on file, enrolled  in M3P  New side effects reported not previously addressed with a pharmacist or physician: None reported  Questions for the pharmacist: No    Confirmed patient received a Conservation Officer, Historic Buildings and a Surveyor, Mining with first shipment. The patient will receive a drug information handout for each medication shipped and additional FDA Medication Guides as required.       DISEASE/MEDICATION-SPECIFIC INFORMATION        N/A    SPECIALTY MEDICATION ADHERENCE     Medication Adherence    Patient reported X missed doses in the last month: 0  Specialty Medication: adalimumab : HUMIRA (CF) PEN 40 mg/0.4 mL injection  Patient is on additional specialty medications: No  Informant: patient              Were doses missed due to medication being on hold? No    Humira  40 mg/0.66ml: 1 doses of medicine on hand       Specialty medication is an injection or given on a cycle: Yes, Next injection is scheduled for 01/11/25.    REFERRAL TO PHARMACIST     Referral to the pharmacist: Not needed      [facility]     Shipping address confirmed in Epic.     Cost and Payment: Patient has a copay of $2100. They are aware and are enrolled in M3P, nothing is due at the pharmacy.    Delivery Scheduled: Yes, Expected medication delivery date: 01/08/25.     Medication will be delivered via Next Day Courier to the prescription address in Epic WAM.    Cynthia Ramirez, PharmD   [facility]  Specialty Pharmacist

## 2025-01-07 MED FILL — HUMIRA PEN CITRATE FREE 40 MG/0.4 ML: SUBCUTANEOUS | 28 days supply | Qty: 4 | Fill #1

## 2025-01-12 DIAGNOSIS — M0579 Rheumatoid arthritis with rheumatoid factor of multiple sites without organ or systems involvement: Principal | ICD-10-CM

## 2025-01-12 MED ORDER — ADALIMUMAB-AATY 40 MG/0.4 ML SUBCUTANEOUS AUTO-INJECTOR KIT
SUBCUTANEOUS | 11 refills | 0.00000 days | Status: CP
Start: 2025-01-12 — End: ?

## 2025-01-13 NOTE — Progress Notes (Signed)
 Setting outreach to onboard for generic when patient is due for refill.  Humira  last filled 6 days ago for 28 day supply.     Cynthia Ramirez  [facility]  [phone] opt 4

## 2025-01-13 NOTE — Progress Notes (Signed)
 [facility] Specialty Medication Onboarding    Specialty Medication: adalimumab -aaty  Prior Authorization: Approved   Financial Assistance: No - copay  <$25  Copay/Day Supply: $0 / 28 days    Insurance Restrictions: Yes - max 1 month supply     Notes to Pharmacist:   Credit Card on File: not applicable  Start Date on Rx:    Delivery Method (based on home address currently on file): Courier      The triage team has completed the benefits investigation and has determined that the patient is able to fill this medication at [facility] Delivery Pharm[facility]. Please contact the patient to complete the onboarding or follow up with the prescribing physician as needed.

## 2025-01-15 MED ORDER — TIOTROPIUM BROMIDE 18 MCG CAPSULE WITH INHALATION DEVICE
ORAL_CAPSULE | Freq: Every day | RESPIRATORY_TRACT | 11 refills | 30.00000 days
Start: 2025-01-15 — End: 2026-01-15

## 2025-01-19 DIAGNOSIS — H43811 Vitreous degeneration, right eye: Principal | ICD-10-CM

## 2025-01-19 DIAGNOSIS — H3589 Other specified retinal disorders: Secondary | ICD-10-CM

## 2025-01-19 NOTE — Progress Notes (Signed)
#   Posterior Vitreous Detachment, OD:  - c/o floaters on 01/17/25  - Exam: positive for Weiss ring, shafer negative  - No retinal break / tear / hole noted on scleral depressed exam today.     Extended Ophthalmoscopy:  20D Lens  Retinal Periphery - right:  pigmentary changes  Retinal Periphery - left:  pigmentary changes      Plan:  - The nature of posterior vitreous detachment was discussed with the patient as well as its physiology, its age prevalence, and its possible implication regarding retinal breaks and detachment. All the patient`s questions were answered. The patient was asked to return if new or different flashes, floaters, or curtain shades develop.  - RTC 1 month for PVD f/u w/ any available CAP or Dr. 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 Small IRF over atrophic areas.  Discussed compliment inhibition.     Cataracts - early, seeing Dr. Edmon. Recommend refraction.     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 Edmon

## 2025-01-20 NOTE — Progress Notes (Signed)
 Attestation    NEURO-OPHTHALMOLOGY ATTENDING:     I personally reviewed all relevant patient medical data, including labs, visual field, neuroimaging  and ophthalmic imaging, and discussed and agree with resident.  I reviewed and agree with the resident's note.  I actively participated in the medical decision making and agree with the documented assessment and plan that we discussed with the patient, and relayed the plan to necessary care team providers.    Cathie Clutter, MD  Assistant Professor of Ophthalmology  Neuro-Ophthalmology
